# Patient Record
Sex: Male | Born: 1935
Health system: Southern US, Community
[De-identification: ages and names within clinical notes are randomized; demographics above are authoritative.]

## PROBLEM LIST (undated history)

## (undated) DIAGNOSIS — M5030 Other cervical disc degeneration, unspecified cervical region: Secondary | ICD-10-CM

## (undated) DIAGNOSIS — L409 Psoriasis, unspecified: Secondary | ICD-10-CM

## (undated) DIAGNOSIS — I2510 Atherosclerotic heart disease of native coronary artery without angina pectoris: Secondary | ICD-10-CM

## (undated) DIAGNOSIS — K5792 Diverticulitis of intestine, part unspecified, without perforation or abscess without bleeding: Secondary | ICD-10-CM

## (undated) DIAGNOSIS — I10 Essential (primary) hypertension: Secondary | ICD-10-CM

## (undated) DIAGNOSIS — M1990 Unspecified osteoarthritis, unspecified site: Secondary | ICD-10-CM

## (undated) DIAGNOSIS — E785 Hyperlipidemia, unspecified: Secondary | ICD-10-CM

## (undated) DIAGNOSIS — Z8719 Personal history of other diseases of the digestive system: Secondary | ICD-10-CM

## (undated) DIAGNOSIS — N400 Enlarged prostate without lower urinary tract symptoms: Secondary | ICD-10-CM

## (undated) DIAGNOSIS — I5189 Other ill-defined heart diseases: Secondary | ICD-10-CM

## (undated) DIAGNOSIS — K469 Unspecified abdominal hernia without obstruction or gangrene: Secondary | ICD-10-CM

## (undated) DIAGNOSIS — I639 Cerebral infarction, unspecified: Secondary | ICD-10-CM

## (undated) DIAGNOSIS — D649 Anemia, unspecified: Secondary | ICD-10-CM

## (undated) HISTORY — DX: Psoriasis, unspecified: L40.9

## (undated) HISTORY — DX: Other ill-defined heart diseases: I51.89

## (undated) HISTORY — DX: Atherosclerotic heart disease of native coronary artery without angina pectoris: I25.10

## (undated) HISTORY — DX: Hyperlipidemia, unspecified: E78.5

## (undated) HISTORY — DX: Cerebral infarction, unspecified: I63.9

## (undated) HISTORY — DX: Essential (primary) hypertension: I10

## (undated) HISTORY — DX: Benign prostatic hyperplasia without lower urinary tract symptoms: N40.0

## (undated) HISTORY — PX: OTHER SURGICAL HISTORY: SHX169

## (undated) HISTORY — DX: Unspecified osteoarthritis, unspecified site: M19.90

## (undated) HISTORY — DX: Anemia, unspecified: D64.9

## (undated) HISTORY — DX: Personal history of other diseases of the digestive system: Z87.19

## (undated) HISTORY — DX: Unspecified abdominal hernia without obstruction or gangrene: K46.9

## (undated) HISTORY — DX: Diverticulitis of intestine, part unspecified, without perforation or abscess without bleeding: K57.92

## (undated) HISTORY — DX: Other cervical disc degeneration, unspecified cervical region: M50.30

---
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
## 1997-09-07 ENCOUNTER — Observation Stay (HOSPITAL_COMMUNITY): Admission: RE | Admit: 1997-09-07 | Discharge: 1997-09-08 | Payer: Self-pay | Admitting: Orthopedic Surgery

## 1997-09-15 ENCOUNTER — Other Ambulatory Visit: Admission: RE | Admit: 1997-09-15 | Discharge: 1997-09-15 | Payer: Self-pay | Admitting: Family Medicine

## 1997-10-04 ENCOUNTER — Other Ambulatory Visit: Admission: RE | Admit: 1997-10-04 | Discharge: 1997-10-04 | Payer: Self-pay | Admitting: Family Medicine

## 1997-10-09 ENCOUNTER — Encounter: Admission: RE | Admit: 1997-10-09 | Discharge: 1998-01-07 | Payer: Self-pay | Admitting: Orthopedic Surgery

## 1997-11-01 ENCOUNTER — Other Ambulatory Visit: Admission: RE | Admit: 1997-11-01 | Discharge: 1997-11-01 | Payer: Self-pay | Admitting: Family Medicine

## 1998-03-08 ENCOUNTER — Ambulatory Visit (HOSPITAL_COMMUNITY): Admission: RE | Admit: 1998-03-08 | Discharge: 1998-03-08 | Payer: Self-pay | Admitting: Family Medicine

## 1998-03-08 ENCOUNTER — Encounter: Payer: Self-pay | Admitting: Family Medicine

## 1999-02-10 ENCOUNTER — Emergency Department (HOSPITAL_COMMUNITY): Admission: EM | Admit: 1999-02-10 | Discharge: 1999-02-10 | Payer: Self-pay | Admitting: *Deleted

## 2001-04-07 HISTORY — PX: OTHER SURGICAL HISTORY: SHX169

## 2001-06-07 ENCOUNTER — Encounter: Payer: Self-pay | Admitting: *Deleted

## 2001-06-07 ENCOUNTER — Ambulatory Visit (HOSPITAL_COMMUNITY): Admission: RE | Admit: 2001-06-07 | Discharge: 2001-06-07 | Payer: Self-pay | Admitting: *Deleted

## 2001-06-21 ENCOUNTER — Encounter: Payer: Self-pay | Admitting: *Deleted

## 2001-06-23 ENCOUNTER — Ambulatory Visit (HOSPITAL_COMMUNITY): Admission: RE | Admit: 2001-06-23 | Discharge: 2001-06-23 | Payer: Self-pay | Admitting: *Deleted

## 2001-06-23 ENCOUNTER — Encounter (INDEPENDENT_AMBULATORY_CARE_PROVIDER_SITE_OTHER): Payer: Self-pay | Admitting: Specialist

## 2001-08-29 ENCOUNTER — Emergency Department (HOSPITAL_COMMUNITY): Admission: EM | Admit: 2001-08-29 | Discharge: 2001-08-29 | Payer: Self-pay | Admitting: Emergency Medicine

## 2001-08-29 ENCOUNTER — Encounter: Payer: Self-pay | Admitting: Emergency Medicine

## 2001-09-06 ENCOUNTER — Emergency Department (HOSPITAL_COMMUNITY): Admission: EM | Admit: 2001-09-06 | Discharge: 2001-09-06 | Payer: Self-pay | Admitting: *Deleted

## 2001-09-23 ENCOUNTER — Encounter (HOSPITAL_BASED_OUTPATIENT_CLINIC_OR_DEPARTMENT_OTHER): Admission: RE | Admit: 2001-09-23 | Discharge: 2001-12-22 | Payer: Self-pay | Admitting: Internal Medicine

## 2001-12-27 ENCOUNTER — Encounter (HOSPITAL_BASED_OUTPATIENT_CLINIC_OR_DEPARTMENT_OTHER): Admission: RE | Admit: 2001-12-27 | Discharge: 2001-12-31 | Payer: Self-pay | Admitting: Internal Medicine

## 2002-09-20 ENCOUNTER — Encounter: Admission: RE | Admit: 2002-09-20 | Discharge: 2002-09-20 | Payer: Self-pay | Admitting: Family Medicine

## 2002-09-20 ENCOUNTER — Encounter: Payer: Self-pay | Admitting: Family Medicine

## 2002-11-17 ENCOUNTER — Ambulatory Visit (HOSPITAL_COMMUNITY): Admission: RE | Admit: 2002-11-17 | Discharge: 2002-11-17 | Payer: Self-pay | Admitting: Gastroenterology

## 2005-10-18 ENCOUNTER — Emergency Department (HOSPITAL_COMMUNITY): Admission: EM | Admit: 2005-10-18 | Discharge: 2005-10-18 | Payer: Self-pay | Admitting: Emergency Medicine

## 2006-04-16 ENCOUNTER — Encounter: Admission: RE | Admit: 2006-04-16 | Discharge: 2006-05-18 | Payer: Self-pay | Admitting: Family Medicine

## 2008-04-11 ENCOUNTER — Encounter: Admission: RE | Admit: 2008-04-11 | Discharge: 2008-05-24 | Payer: Self-pay | Admitting: Family Medicine

## 2008-08-30 ENCOUNTER — Emergency Department (HOSPITAL_COMMUNITY): Admission: EM | Admit: 2008-08-30 | Discharge: 2008-08-30 | Payer: Self-pay | Admitting: Emergency Medicine

## 2009-08-01 ENCOUNTER — Inpatient Hospital Stay (HOSPITAL_COMMUNITY): Admission: EM | Admit: 2009-08-01 | Discharge: 2009-08-04 | Payer: Self-pay | Admitting: Emergency Medicine

## 2009-08-02 ENCOUNTER — Encounter (INDEPENDENT_AMBULATORY_CARE_PROVIDER_SITE_OTHER): Payer: Self-pay | Admitting: Internal Medicine

## 2009-08-05 LAB — HM DIABETES FOOT EXAM

## 2010-03-08 ENCOUNTER — Emergency Department (HOSPITAL_COMMUNITY)
Admission: EM | Admit: 2010-03-08 | Discharge: 2010-03-08 | Payer: Self-pay | Source: Home / Self Care | Admitting: Emergency Medicine

## 2010-06-25 LAB — DIFFERENTIAL
Basophils Absolute: 0 10*3/uL (ref 0.0–0.1)
Lymphocytes Relative: 15 % (ref 12–46)
Monocytes Absolute: 0.4 10*3/uL (ref 0.1–1.0)
Monocytes Relative: 4 % (ref 3–12)
Neutro Abs: 6.5 10*3/uL (ref 1.7–7.7)

## 2010-06-25 LAB — GLUCOSE, CAPILLARY
Glucose-Capillary: 135 mg/dL — ABNORMAL HIGH (ref 70–99)
Glucose-Capillary: 148 mg/dL — ABNORMAL HIGH (ref 70–99)
Glucose-Capillary: 164 mg/dL — ABNORMAL HIGH (ref 70–99)
Glucose-Capillary: 177 mg/dL — ABNORMAL HIGH (ref 70–99)
Glucose-Capillary: 187 mg/dL — ABNORMAL HIGH (ref 70–99)
Glucose-Capillary: 197 mg/dL — ABNORMAL HIGH (ref 70–99)
Glucose-Capillary: 202 mg/dL — ABNORMAL HIGH (ref 70–99)
Glucose-Capillary: 350 mg/dL — ABNORMAL HIGH (ref 70–99)

## 2010-06-25 LAB — CBC
HCT: 34.8 % — ABNORMAL LOW (ref 39.0–52.0)
HCT: 35.3 % — ABNORMAL LOW (ref 39.0–52.0)
Hemoglobin: 10.8 g/dL — ABNORMAL LOW (ref 13.0–17.0)
Hemoglobin: 11.3 g/dL — ABNORMAL LOW (ref 13.0–17.0)
Hemoglobin: 11.6 g/dL — ABNORMAL LOW (ref 13.0–17.0)
MCHC: 32.7 g/dL (ref 30.0–36.0)
MCHC: 32.8 g/dL (ref 30.0–36.0)
Platelets: 125 10*3/uL — ABNORMAL LOW (ref 150–400)
Platelets: 134 10*3/uL — ABNORMAL LOW (ref 150–400)
RBC: 3.7 MIL/uL — ABNORMAL LOW (ref 4.22–5.81)
RBC: 3.87 MIL/uL — ABNORMAL LOW (ref 4.22–5.81)
RBC: 4.48 MIL/uL (ref 4.22–5.81)
RDW: 14.2 % (ref 11.5–15.5)
RDW: 14.3 % (ref 11.5–15.5)
RDW: 14.4 % (ref 11.5–15.5)
RDW: 14.6 % (ref 11.5–15.5)
WBC: 7.2 10*3/uL (ref 4.0–10.5)

## 2010-06-25 LAB — CLOSTRIDIUM DIFFICILE EIA

## 2010-06-25 LAB — COMPREHENSIVE METABOLIC PANEL
ALT: 13 U/L (ref 0–53)
Albumin: 3 g/dL — ABNORMAL LOW (ref 3.5–5.2)
Albumin: 4 g/dL (ref 3.5–5.2)
Alkaline Phosphatase: 35 U/L — ABNORMAL LOW (ref 39–117)
Alkaline Phosphatase: 56 U/L (ref 39–117)
BUN: 12 mg/dL (ref 6–23)
BUN: 9 mg/dL (ref 6–23)
Chloride: 102 mEq/L (ref 96–112)
Chloride: 109 mEq/L (ref 96–112)
Chloride: 112 mEq/L (ref 96–112)
Creatinine, Ser: 0.85 mg/dL (ref 0.4–1.5)
GFR calc non Af Amer: >60 mL/min (ref >60)
Potassium: 3.9 mEq/L (ref 3.5–5.1)
Potassium: 4 mEq/L (ref 3.5–5.1)
Total Bilirubin: 0.4 mg/dL (ref 0.3–1.2)
Total Bilirubin: 0.7 mg/dL (ref 0.3–1.2)
Total Protein: 5.9 g/dL — ABNORMAL LOW (ref 6.0–8.3)
Total Protein: 7.3 g/dL (ref 6.0–8.3)

## 2010-06-25 LAB — URINE MICROSCOPIC-ADD ON

## 2010-06-25 LAB — CARDIAC PANEL(CRET KIN+CKTOT+MB+TROPI)
CK, MB: 4.6 ng/mL — ABNORMAL HIGH (ref 0.3–4.0)
CK, MB: 8 ng/mL (ref 0.3–4.0)
Relative Index: 4.4 — ABNORMAL HIGH (ref 0.0–2.5)
Total CK: 105 U/L (ref 7–232)
Total CK: 107 U/L (ref 7–232)
Total CK: 183 U/L (ref 7–232)
Troponin I: 0.05 ng/mL (ref 0.00–0.06)
Troponin I: 0.07 ng/mL — ABNORMAL HIGH (ref 0.00–0.06)
Troponin I: 0.09 ng/mL — ABNORMAL HIGH (ref 0.00–0.06)

## 2010-06-25 LAB — PHOSPHORUS: Phosphorus: 2.8 mg/dL (ref 2.3–4.6)

## 2010-06-25 LAB — HEMOCCULT GUIAC POC 1CARD (OFFICE)
Fecal Occult Bld: NEGATIVE
Fecal Occult Bld: NEGATIVE

## 2010-06-25 LAB — HEPARIN LEVEL (UNFRACTIONATED)
Heparin Unfractionated: 0.51 IU/mL (ref 0.30–0.70)
Heparin Unfractionated: <0.1 IU/mL — ABNORMAL LOW (ref 0.30–0.70)

## 2010-06-25 LAB — TSH: TSH: 3.188 u[IU]/mL (ref 0.350–4.500)

## 2010-06-25 LAB — CULTURE, BLOOD (ROUTINE X 2): Culture: NO GROWTH

## 2010-06-25 LAB — URINALYSIS, ROUTINE W REFLEX MICROSCOPIC
Specific Gravity, Urine: 1.033 — ABNORMAL HIGH (ref 1.005–1.030)
pH: 5.5 (ref 5.0–8.0)

## 2010-06-25 LAB — POCT CARDIAC MARKERS: CKMB, poc: 1.4 ng/mL (ref 1.0–8.0)

## 2010-06-25 LAB — BRAIN NATRIURETIC PEPTIDE: Pro B Natriuretic peptide (BNP): 286 pg/mL — ABNORMAL HIGH (ref 0.0–100.0)

## 2010-06-25 LAB — LIPASE, BLOOD: Lipase: 39 U/L (ref 11–59)

## 2010-08-14 ENCOUNTER — Emergency Department (HOSPITAL_COMMUNITY)
Admission: EM | Admit: 2010-08-14 | Discharge: 2010-08-14 | Disposition: A | Discharge status: Home / Self Care | Payer: Medicare Other | Attending: Emergency Medicine | Admitting: Emergency Medicine

## 2010-08-14 ENCOUNTER — Emergency Department (HOSPITAL_COMMUNITY): Payer: Medicare Other

## 2010-08-14 DIAGNOSIS — J40 Bronchitis, not specified as acute or chronic: Secondary | ICD-10-CM | POA: Insufficient documentation

## 2010-08-14 DIAGNOSIS — R05 Cough: Secondary | ICD-10-CM | POA: Insufficient documentation

## 2010-08-14 DIAGNOSIS — E119 Type 2 diabetes mellitus without complications: Secondary | ICD-10-CM | POA: Insufficient documentation

## 2010-08-14 DIAGNOSIS — R059 Cough, unspecified: Secondary | ICD-10-CM | POA: Insufficient documentation

## 2010-08-14 DIAGNOSIS — I10 Essential (primary) hypertension: Secondary | ICD-10-CM | POA: Insufficient documentation

## 2010-08-23 NOTE — Op Note (Signed)
   NAME:  Jeffrey Kaiser, Jeffrey Kaiser                          ACCOUNT NO.:  [id number]   MEDICAL RECORD NO.:  [id number]                   PATIENT TYPE:  AMB   LOCATION:  ENDO                                 FACILITY:  [facility]   PHYSICIAN:  Graylin Shiver, M.D.                DATE OF BIRTH:  1925-08-27   DATE OF PROCEDURE:  11/17/2002  DATE OF DISCHARGE:                                 OPERATIVE REPORT   PROCEDURE:  Colonoscopy.   INDICATION:  Screening.   Informed consent was obtained after explanation of the risks of bleeding,  infection, and perforation.   PREMEDICATIONS:  1. Fentanyl 62.5 mcg IV.  2. Versed 5 mg IV.   DESCRIPTION OF PROCEDURE:  With the patient in the left lateral decubitus  position, a rectal exam was performed, and no masses were felt.  The Olympus  colonoscope was inserted into the rectum and advanced around the colon to  the cecum.  Cecal landmarks were identified.  The cecum and ascending colon  looked normal.  The transverse colon looked normal.  The descending colon  and sigmoid revealed diverticulosis which were most extensive in the region  of the sigmoid.  The rectum looked normal.  He tolerated the procedure well  without complications.   IMPRESSION:  Diverticulosis of the left colon.                                               Graylin Shiver, M.D.    SFG/MEDQ  D:  11/17/2002  T:  11/17/2002  Job:  [id number]   cc:   Chales Salmon. Abigail Miyamoto, M.D.  [address]  [place]  [place] [address]  Fax: [phone]

## 2010-08-23 NOTE — Op Note (Signed)
[facility]  Patient:    Jeffrey Kaiser, Jeffrey Kaiser Visit Number: [id number] MRN: [id number]          Service Type: DSU Location: RCRM 2550 08 Attending Physician:  Kandis Mannan Dictated by:   Donnie Coffin Samuella Cota, M.D. Proc. Date: 06/23/01 Admit Date:  06/23/2001 Discharge Date: 06/23/2001   CC:         Molly Maduro K. Abigail Miyamoto, M.D.   Operative Report  CCS# [id number]  PREOPERATIVE DIAGNOSIS:  Large lipoma, right anterior lower chest and upper abdomen.  POSTOPERATIVE DIAGNOSIS:  Large lipoma, right anterior lower chest and upper abdomen.  OPERATION PERFORMED:  Excision of lipoma, right anterior lower chest and upper abdomen.  SURGEON:  Maisie Fus B. Samuella Cota, M.D.  ANESTHESIA:  General.  ANESTHESIOLOGIST:  Dr. Krista Blue and CRNA.  DESCRIPTION OF PROCEDURE:  The patient was taken to the operating room and placed on the table in supine position.  After satisfactory general anesthetic with LMA intubation, the right lower chest and upper abdomen was prepped and draped as a sterile field.  The marking pen was used to outline a subcutaneous mass which was 12 cm by 12 cm.  A vertical incision was then made over the mass and the mass was only a fatty tissue but it did not shell out like a routine lipoma.  It was necessary to develop skin flaps to the periphery of the mass.  The tissue was then taken off of the anterior rectus sheath.  This appeared only to be fatty tissue.  The skin flaps were purposely kept fairly thick.  The skin flaps were further debrided to try to smooth out the wound. A #10 Jackson-Pratt drain was placed through a stab wound inferior and lateral to the incision and sutured to the skin using 3-0 nylon.  The wound was copiously irrigated.  Subcutaneous tissue closed with 3-0 Vicryl.  The skin was closed with interrupted sutures of 3-0 nylon as vertical mattress sutures. The Jackson-Pratt drain was connected to bulb suction.  Vaseline gauze was placed  over the suture line and around the drain site.  Dry sterile dressing using 4 x 4s, ABD, and 4 inch HypaFix was applied.  The patient seemed to tolerate the procedure well.  The incision was 9 cm and the tissue removed was 12 cm by 12 cm. Dictated by:   Donnie Coffin Samuella Cota, M.D. Attending Physician:  Kandis Mannan DD:  06/23/01 TD:  06/24/01 Job: [id number]

## 2010-09-04 ENCOUNTER — Ambulatory Visit (INDEPENDENT_AMBULATORY_CARE_PROVIDER_SITE_OTHER): Payer: Medicare Other | Admitting: Internal Medicine

## 2010-09-04 ENCOUNTER — Telehealth: Payer: Self-pay | Admitting: Internal Medicine

## 2010-09-04 ENCOUNTER — Encounter: Payer: Self-pay | Admitting: Internal Medicine

## 2010-09-04 ENCOUNTER — Ambulatory Visit: Payer: Self-pay | Admitting: Internal Medicine

## 2010-09-04 ENCOUNTER — Other Ambulatory Visit: Payer: Self-pay | Admitting: Internal Medicine

## 2010-09-04 ENCOUNTER — Other Ambulatory Visit (INDEPENDENT_AMBULATORY_CARE_PROVIDER_SITE_OTHER): Payer: Medicare Other

## 2010-09-04 VITALS — BP 140/62 | HR 80 | Temp 98.5°F | Ht 64.0 in | Wt 157.2 lb

## 2010-09-04 DIAGNOSIS — R5381 Other malaise: Secondary | ICD-10-CM

## 2010-09-04 DIAGNOSIS — L409 Psoriasis, unspecified: Secondary | ICD-10-CM

## 2010-09-04 DIAGNOSIS — D649 Anemia, unspecified: Secondary | ICD-10-CM

## 2010-09-04 DIAGNOSIS — R5383 Other fatigue: Secondary | ICD-10-CM

## 2010-09-04 DIAGNOSIS — M1990 Unspecified osteoarthritis, unspecified site: Secondary | ICD-10-CM | POA: Insufficient documentation

## 2010-09-04 DIAGNOSIS — IMO0001 Reserved for inherently not codable concepts without codable children: Secondary | ICD-10-CM

## 2010-09-04 DIAGNOSIS — Z0000 Encounter for general adult medical examination without abnormal findings: Secondary | ICD-10-CM | POA: Insufficient documentation

## 2010-09-04 DIAGNOSIS — Z8719 Personal history of other diseases of the digestive system: Secondary | ICD-10-CM | POA: Insufficient documentation

## 2010-09-04 DIAGNOSIS — I10 Essential (primary) hypertension: Secondary | ICD-10-CM | POA: Insufficient documentation

## 2010-09-04 DIAGNOSIS — M5030 Other cervical disc degeneration, unspecified cervical region: Secondary | ICD-10-CM

## 2010-09-04 DIAGNOSIS — E785 Hyperlipidemia, unspecified: Secondary | ICD-10-CM

## 2010-09-04 DIAGNOSIS — I5189 Other ill-defined heart diseases: Secondary | ICD-10-CM

## 2010-09-04 HISTORY — DX: Psoriasis, unspecified: L40.9

## 2010-09-04 HISTORY — DX: Other ill-defined heart diseases: I51.89

## 2010-09-04 HISTORY — DX: Anemia, unspecified: D64.9

## 2010-09-04 HISTORY — DX: Personal history of other diseases of the digestive system: Z87.19

## 2010-09-04 HISTORY — DX: Other cervical disc degeneration, unspecified cervical region: M50.30

## 2010-09-04 LAB — CBC WITH DIFFERENTIAL/PLATELET
Basophils Relative: 0.4 % (ref 0.0–3.0)
Eosinophils Relative: 2.8 % (ref 0.0–5.0)
Lymphocytes Relative: 26 % (ref 12.0–46.0)
MCV: 86.3 fl (ref 78.0–100.0)
Monocytes Relative: 7.9 % (ref 3.0–12.0)
Neutrophils Relative %: 62.9 % (ref 43.0–77.0)
RBC: 4.5 Mil/uL (ref 4.22–5.81)
WBC: 6.3 10*3/uL (ref 4.5–10.5)

## 2010-09-04 LAB — BASIC METABOLIC PANEL
BUN: 18 mg/dL (ref 6–23)
Chloride: 101 mEq/L (ref 96–112)
Creatinine, Ser: 0.8 mg/dL (ref 0.4–1.5)
Glucose, Bld: 136 mg/dL — ABNORMAL HIGH (ref 70–99)

## 2010-09-04 LAB — MICROALBUMIN / CREATININE URINE RATIO
Creatinine,U: 143 mg/dL
Microalb Creat Ratio: 0.8 mg/g (ref 0.0–30.0)
Microalb, Ur: 1.2 mg/dL (ref 0.0–1.9)

## 2010-09-04 LAB — HEPATIC FUNCTION PANEL
ALT: 17 U/L (ref 0–53)
Albumin: 3.9 g/dL (ref 3.5–5.2)
Total Bilirubin: 0.8 mg/dL (ref 0.3–1.2)

## 2010-09-04 LAB — HEMOGLOBIN A1C: Hgb A1c MFr Bld: 8.8 % — ABNORMAL HIGH (ref 4.6–6.5)

## 2010-09-04 LAB — TSH: TSH: 2.38 u[IU]/mL (ref 0.35–5.50)

## 2010-09-04 LAB — LIPID PANEL: Cholesterol: 186 mg/dL (ref 0–200)

## 2010-09-04 MED ORDER — CYCLOBENZAPRINE HCL 5 MG PO TABS: 5.0000 mg | ORAL_TABLET | Freq: Three times a day (TID) | ORAL | Status: DC | PRN

## 2010-09-04 MED ORDER — GLIPIZIDE ER 10 MG PO TB24: 10.0000 mg | ORAL_TABLET | Freq: Every day | ORAL | Status: DC

## 2010-09-04 NOTE — Patient Instructions (Signed)
Continue all other medications as before Please go to LAB in the Basement for the blood and/or urine tests to be done today Please call the phone number [phone] (the PhoneTree System) for results of testing in 2-3 days;  When calling, simply dial the number, and when prompted enter the MRN number above (the Medical Record Number) and the # key, then the message should start. Please return in 6 months

## 2010-09-04 NOTE — Telephone Encounter (Signed)
Forwarded to Dr. John for review °

## 2010-09-04 NOTE — Progress Notes (Signed)
  Subjective:    Patient ID: Jeffrey Kaiser, male    DOB: Feb 27, 1926, 77 y.o.   MRN: [id number]  HPI  Here for wellness and f/u;  Overall doing ok;  Pt denies CP, worsening SOB, DOE, wheezing, orthopnea, PND, worsening LE edema, palpitations, dizziness or syncope.  Pt denies neurological change such as new Headache, facial or extremity weakness.  Pt denies polydipsia, polyuria, or low sugar symptoms. Pt states overall good compliance with treatment and medications, good tolerability, and trying to follow lower cholesterol diet.  Pt denies worsening depressive symptoms, suicidal ideation or panic. No fever, wt loss, night sweats, loss of appetite, or other constitutional symptoms.  Pt states good ability with ADL's, low fall risk, home safety reviewed and adequate, no significant changes in hearing or vision, and occasionally active with exercise.  Overall doing ok, except for the recurring neck pain  without change in severity, bowel or bladder change, fever, wt loss,  worsening LE pain/numbness/weakness, gait change or falls. Does have sense of ongoing fatigue, but denies signficant hypersomnolence.  Past Medical History  Diagnosis Date  . Arthritis   . Diabetes mellitus   . Diverticulitis   . Glaucoma   . Hypertension   . Hyperlipidemia   . Hernia   . History of colonic diverticulitis 09/04/2010  . Anemia 09/04/2010  . DDD (degenerative disc disease), cervical 09/04/2010  . Diastolic dysfunction 09/04/2010   Past Surgical History  Procedure Date  . Tonsillectomy 1952  . Broken legs 1981 and 1995    reports that he has never smoked. He does not have any smokeless tobacco history on file. He reports that he does not drink alcohol or use illicit drugs. family history includes Arthritis in his father and mother; Cancer in his other; Heart disease in his father and mother; and Hypertension in his father and mother. No Known Allergies No current outpatient prescriptions on file prior to visit.    Review of Systems All otherwise neg per pt except for a 'cyst" to the right shoulder he is concerned about    Objective:   Physical Exam BP 140/62  Pulse 80  Temp(Src) 98.5 F (36.9 C) (Oral)  Ht 5\' 4"  (1.626 m)  Wt 157 lb 4 oz (71.328 kg)  BMI 26.99 kg/m2  SpO2 95% Physical Exam  VS noted Constitutional: Pt appears well-developed and well-nourished.  HENT: Head: Normocephalic.  Right Ear: External ear normal.  Left Ear: External ear normal.  Eyes: Conjunctivae and EOM are normal. Pupils are equal, round, and reactive to light.  Neck: Normal range of motion. Neck supple.  Cardiovascular: Normal rate and regular rhythm.   Pulmonary/Chest: Effort normal and breath sounds normal.  Abd:  Soft, NT, non-distended, + BS Neurological: Pt is alert. No cranial nerve deficit.  Skin: Skin is warm. No erythema.  Psychiatric: Pt behavior is normal. Thought content normal.         Assessment & Plan:

## 2010-09-04 NOTE — Assessment & Plan Note (Signed)
With some stiffness bilat paracervical - ok to try trial flexeril prn

## 2010-09-04 NOTE — Assessment & Plan Note (Signed)
stable overall by hx and exam, most recent data reviewed with pt, and pt to continue medical treatment as before 

## 2010-09-04 NOTE — Assessment & Plan Note (Signed)
Etiology unclear, Exam otherwise benign, to check labs as documented, follow with expectant management  

## 2010-09-09 ENCOUNTER — Encounter: Payer: Self-pay | Admitting: Internal Medicine

## 2010-09-09 DIAGNOSIS — N400 Enlarged prostate without lower urinary tract symptoms: Secondary | ICD-10-CM

## 2010-09-09 DIAGNOSIS — I2510 Atherosclerotic heart disease of native coronary artery without angina pectoris: Secondary | ICD-10-CM | POA: Insufficient documentation

## 2010-09-09 HISTORY — DX: Benign prostatic hyperplasia without lower urinary tract symptoms: N40.0

## 2010-10-21 ENCOUNTER — Other Ambulatory Visit: Payer: Self-pay

## 2010-10-21 ENCOUNTER — Telehealth: Payer: Self-pay

## 2010-10-21 MED ORDER — ISOSORBIDE MONONITRATE 60 MG PO TB24: 60.0000 mg | ORAL_TABLET | Freq: Every day | ORAL | Status: DC

## 2010-10-21 NOTE — Telephone Encounter (Signed)
Please clarify with pt, but I believe it is 1/2 per day, and the rx sent then would be in error  If pt confirms the 1/2 per day, please inform pharmacy

## 2010-10-21 NOTE — Telephone Encounter (Signed)
Pharmacy received refill on Isosorbide mononitrate 60 mg 24 hr. Please clarify 1 every day or 1/2 every day.

## 2010-10-22 NOTE — Telephone Encounter (Signed)
Called patient; left message to call back.

## 2010-10-22 NOTE — Telephone Encounter (Signed)
Called patient and he confirmed he is taking 1/2 per day, called [facility] [address] informed of information.

## 2010-11-20 ENCOUNTER — Ambulatory Visit (INDEPENDENT_AMBULATORY_CARE_PROVIDER_SITE_OTHER): Payer: Medicare Other | Admitting: Internal Medicine

## 2010-11-20 ENCOUNTER — Encounter: Payer: Self-pay | Admitting: Internal Medicine

## 2010-11-20 VITALS — BP 152/82 | HR 83 | Temp 97.0°F | Ht 65.0 in | Wt 157.5 lb

## 2010-11-20 DIAGNOSIS — E785 Hyperlipidemia, unspecified: Secondary | ICD-10-CM

## 2010-11-20 DIAGNOSIS — M25519 Pain in unspecified shoulder: Secondary | ICD-10-CM

## 2010-11-20 DIAGNOSIS — I10 Essential (primary) hypertension: Secondary | ICD-10-CM

## 2010-11-20 DIAGNOSIS — M25511 Pain in right shoulder: Secondary | ICD-10-CM | POA: Insufficient documentation

## 2010-11-20 MED ORDER — TRAMADOL HCL 50 MG PO TABS: 50.0000 mg | ORAL_TABLET | Freq: Four times a day (QID) | ORAL | Status: AC | PRN

## 2010-11-20 NOTE — Assessment & Plan Note (Addendum)
Improved overall with recent med change may 2012;  Continue all other medications as before, for labs next visit, call for cbg > 200 \ Lab Results  Component Value Date   HGBA1C 8.8* 09/04/2010

## 2010-11-20 NOTE — Progress Notes (Signed)
Subjective:    Patient ID: Jeffrey Kaiser, male    DOB: 18-Feb-1926, 77 y.o.   MRN: [id number]  HPI   Here with 3 mo right upper back/shoulder pain with radaiation to the hand, pitched baseball for 25 yrs  (no prior surgury) and has known c-spine DDD per pt per films per ortho at [facility] ; also hx of compound fx RUE remotely as well per Dr Su Hilt (retired otho - [place]). Hurst more to raise the arm .overall no neck pain per se, except midline tender in the back lower c-spine which is chronic to him and no change.   Here to f/u; overall doing ok,  Pt denies chest pain, increased sob or doe, wheezing, orthopnea, PND, increased LE swelling, palpitations, dizziness or syncope.  Pt denies new neurological symptoms such as new headache, or facial or extremity weakness or numbness   Pt denies polydipsia, polyuria, or low sugar symptoms such as weakness or confusion improved with po intake.  Pt states overall good compliance with meds, trying to follow lower cholesterol, diabetic diet, wt overall stable but little exercise however. Past Medical History  Diagnosis Date  . Arthritis   . Diabetes mellitus   . Diverticulitis   . Glaucoma   . Hypertension   . Hyperlipidemia   . Hernia   . History of colonic diverticulitis 09/04/2010  . Anemia 09/04/2010  . DDD (degenerative disc disease), cervical 09/04/2010  . Diastolic dysfunction 09/04/2010  . Psoriasis 09/04/2010  . BPH (benign prostatic hyperplasia) 09/09/2010  . CAD (coronary artery disease)    Past Surgical History  Procedure Date  . Tonsillectomy 1952  . Broken legs 1981 and 1995  . S/p lipoma 2003    reports that he has never smoked. He does not have any smokeless tobacco history on file. He reports that he does not drink alcohol or use illicit drugs. family history includes Arthritis in his father and mother; Cancer in his other; Heart disease in his father and mother; and Hypertension in his father and mother. No Known Allergies Current  Outpatient Prescriptions on File Prior to Visit  Medication Sig Dispense Refill  . clobetasol (TEMOVATE) 0.05 % external solution as directed.        . cyclobenzaprine (FLEXERIL) 5 MG tablet Take 1 tablet (5 mg total) by mouth 3 (three) times daily as needed for muscle spasms.  60 tablet  1  . fosinopril (MONOPRIL) 20 MG tablet Take 20 mg by mouth daily.        Marland Kitchen glipiZIDE (GLIPIZIDE XL) 10 MG 24 hr tablet Take 1 tablet (10 mg total) by mouth daily.  90 tablet  3  . isosorbide mononitrate (IMDUR) 60 MG 24 hr tablet Take 1 tablet (60 mg total) by mouth daily. 1/2 tablet daily  30 tablet  11  . metFORMIN (GLUCOPHAGE) 500 MG tablet 2 by mouth in the morning, 1 by mouth at noon, and 2 by mouth every evening.       . metoprolol (LOPRESSOR) 50 MG tablet Take 50 mg by mouth daily.        . pravastatin (PRAVACHOL) 80 MG tablet Take 80 mg by mouth at bedtime.         Review of Systems Review of Systems  Constitutional: Negative for diaphoresis and unexpected weight change.  HENT: Negative for drooling and tinnitus.   Eyes: Negative for photophobia and visual disturbance.  Respiratory: Negative for choking and stridor.   Gastrointestinal: Negative for vomiting and blood in stool.  Objective:   Physical Exam BP 152/82  Pulse 83  Temp(Src) 97 F (36.1 C) (Oral)  Ht 5\' 5"  (1.651 m)  Wt 157 lb 8 oz (71.442 kg)  BMI 26.21 kg/m2  SpO2 96% Physical Exam  VS noted Constitutional: Pt appears well-developed and well-nourished.  HENT: Head: Normocephalic.  Right Ear: External ear normal.  Left Ear: External ear normal.  Eyes: Conjunctivae and EOM are normal. Pupils are equal, round, and reactive to light.  Neck: Normal range of motion. Neck supple.  Cardiovascular: Normal rate and regular rhythm.   Pulmonary/Chest: Effort normal and breath sounds normal.  Abd:  Soft, NT, non-distended, + BS Neurological: Pt is alert. No cranial nerve deficit.  Skin: Skin is warm. No erythema.  Psychiatric: Pt  behavior is normal. Thought content normal.  Right shoudler NT, has incidental seb cyst posteriorly noninflamed; has marked AC joint djd, and decreaesed ROM by 20 degrees and pain elicited on forward elevation\  O/w neurovasc intact       Assessment & Plan:

## 2010-11-20 NOTE — Patient Instructions (Addendum)
Take all new medications as prescribed Continue all other medications as before You will be contacted regarding the referral for: orthopedic

## 2010-11-20 NOTE — Assessment & Plan Note (Addendum)
stable overall by hx and exam, most recent data reviewed with pt, and pt to continue medical treatment as before, mild increased today prob situational   BP Readings from Last 3 Encounters:  11/20/10 152/82  09/04/10 140/62

## 2010-11-20 NOTE — Assessment & Plan Note (Signed)
stable overall by hx and exam, most recent data reviewed with pt, and pt to continue medical treatment as before  Lab Results  Component Value Date   LDLCALC 112* 09/04/2010    Declines med change today

## 2010-11-20 NOTE — Assessment & Plan Note (Signed)
I suspect right rotater cuff dz, or shoulder DJD  For now for tx with ultram prn, and refer ortho for further eval, likely could use cortisone to shoudler

## 2010-12-13 ENCOUNTER — Other Ambulatory Visit: Payer: Self-pay

## 2010-12-13 MED ORDER — METOPROLOL TARTRATE 50 MG PO TABS: 50.0000 mg | ORAL_TABLET | Freq: Every day | ORAL | Status: DC

## 2011-03-07 ENCOUNTER — Ambulatory Visit (INDEPENDENT_AMBULATORY_CARE_PROVIDER_SITE_OTHER): Payer: Medicare Other | Admitting: Internal Medicine

## 2011-03-07 ENCOUNTER — Encounter: Payer: Self-pay | Admitting: Internal Medicine

## 2011-03-07 VITALS — BP 140/78 | HR 72 | Temp 97.6°F | Ht 65.0 in | Wt 157.4 lb

## 2011-03-07 DIAGNOSIS — E785 Hyperlipidemia, unspecified: Secondary | ICD-10-CM

## 2011-03-07 DIAGNOSIS — I10 Essential (primary) hypertension: Secondary | ICD-10-CM

## 2011-03-07 DIAGNOSIS — Z23 Encounter for immunization: Secondary | ICD-10-CM

## 2011-03-07 NOTE — Assessment & Plan Note (Signed)
stable overall by hx and exam, most recent data reviewed with pt, and pt to continue medical treatment as before, tolerating the increased glucotrol, to f/u labs today

## 2011-03-07 NOTE — Assessment & Plan Note (Signed)
stable overall by hx and exam, most recent data reviewed with pt, and pt to continue medical treatment as before  Lab Results  Component Value Date   LDLCALC 112* 09/04/2010   On high dose pravachol, pt not willing to change at this time, to check f/u labs, for lower chol diet, goal ldl < 70

## 2011-03-07 NOTE — Progress Notes (Signed)
Subjective:    Patient ID: Jeffrey Kaiser, male    DOB: [birth year], 77 y.o.   MRN: [id number]  HPI  Here to f/u; overall doing ok,  Pt denies chest pain, increased sob or doe, wheezing, orthopnea, PND, increased LE swelling, palpitations, dizziness or syncope.  Pt denies new neurological symptoms such as new headache, or facial or extremity weakness or numbness   Pt denies polydipsia, polyuria, or low sugar symptoms such as weakness or confusion improved with po intake.  Pt states overall good compliance with meds, trying to follow lower cholesterol, diabetic diet, wt overall stable but little exercise however. No new complaints.  Overall good compliance with treatment, and good medicine tolerability, just did not take BP meds this am, and is fasting. Past Medical History  Diagnosis Date  . Arthritis   . Diabetes mellitus   . Diverticulitis   . Glaucoma   . Hypertension   . Hyperlipidemia   . Hernia   . History of colonic diverticulitis 09/04/2010  . Anemia 09/04/2010  . DDD (degenerative disc disease), cervical 09/04/2010  . Diastolic dysfunction 09/04/2010  . Psoriasis 09/04/2010  . BPH (benign prostatic hyperplasia) 09/09/2010  . CAD (coronary artery disease)    Past Surgical History  Procedure Date  . Tonsillectomy 1952  . Broken legs 1981 and 1995  . S/p lipoma 2003    reports that he has never smoked. He does not have any smokeless tobacco history on file. He reports that he does not drink alcohol or use illicit drugs. family history includes Arthritis in his father and mother; Cancer in his other; Heart disease in his father and mother; and Hypertension in his father and mother. No Known Allergies Current Outpatient Prescriptions on File Prior to Visit  Medication Sig Dispense Refill  . clobetasol (TEMOVATE) 0.05 % external solution as directed.        . cyclobenzaprine (FLEXERIL) 5 MG tablet Take 1 tablet (5 mg total) by mouth 3 (three) times daily as needed for muscle spasms.  60  tablet  1  . glipiZIDE (GLIPIZIDE XL) 10 MG 24 hr tablet Take 1 tablet (10 mg total) by mouth daily.  90 tablet  3  . isosorbide mononitrate (IMDUR) 60 MG 24 hr tablet Take 1 tablet (60 mg total) by mouth daily. 1/2 tablet daily  30 tablet  11  . metFORMIN (GLUCOPHAGE) 500 MG tablet 2 by mouth in the morning, 1 by mouth at noon, and 2 by mouth every evening.       . metoprolol (LOPRESSOR) 50 MG tablet Take 1 tablet (50 mg total) by mouth daily.  30 tablet  11  . pravastatin (PRAVACHOL) 80 MG tablet Take 80 mg by mouth at bedtime.        . fosinopril (MONOPRIL) 20 MG tablet Take 20 mg by mouth daily.         Review of Systems Review of Systems  Constitutional: Negative for diaphoresis and unexpected weight change.  HENT: Negative for drooling and tinnitus.   Eyes: Negative for photophobia and visual disturbance.  Respiratory: Negative for choking and stridor.   Gastrointestinal: Negative for vomiting and blood in stool.  Genitourinary: Negative for hematuria and decreased urine volume.    Objective:   Physical Exam BP 140/78  Pulse 72  Temp(Src) 97.6 F (36.4 C) (Oral)  Ht 5\' 5"  (1.651 m)  Wt 157 lb 6 oz (71.385 kg)  BMI 26.19 kg/m2  SpO2 97% Physical Exam  VS noted Constitutional: Pt appears  well-developed and well-nourished.  HENT: Head: Normocephalic.  Right Ear: External ear normal.  Left Ear: External ear normal.  Eyes: Conjunctivae and EOM are normal. Pupils are equal, round, and reactive to light.  Neck: Normal range of motion. Neck supple.  Cardiovascular: Normal rate and regular rhythm.   Pulmonary/Chest: Effort normal and breath sounds normal.  Neurological: Pt is alert. No cranial nerve deficit.  Skin: Skin is warm. No erythema.  Psychiatric: Pt behavior is normal. Thought content normal.     Assessment & Plan:

## 2011-03-07 NOTE — Assessment & Plan Note (Signed)
stable overall by hx and exam, most recent data reviewed with pt, and pt to continue medical treatment as before  BP Readings from Last 3 Encounters:  03/07/11 140/78  11/20/10 152/82  09/04/10 140/62

## 2011-03-07 NOTE — Patient Instructions (Signed)
Continue all other medications as before Please go to LAB in the Basement for the blood and/or urine tests to be done today Please call the phone number [phone] (the PhoneTree System) for results of testing in 2-3 days;  When calling, simply dial the number, and when prompted enter the MRN number above (the Medical Record Number) and the # key, then the message should start. Please return in 6 months, or sooner if needed 

## 2011-04-09 ENCOUNTER — Other Ambulatory Visit: Payer: Self-pay

## 2011-04-09 MED ORDER — FOSINOPRIL SODIUM 20 MG PO TABS: 20.0000 mg | ORAL_TABLET | Freq: Every day | ORAL | Status: DC

## 2011-05-27 DIAGNOSIS — H409 Unspecified glaucoma: Secondary | ICD-10-CM | POA: Diagnosis not present

## 2011-05-27 DIAGNOSIS — H2510 Age-related nuclear cataract, unspecified eye: Secondary | ICD-10-CM | POA: Diagnosis not present

## 2011-05-27 DIAGNOSIS — H11009 Unspecified pterygium of unspecified eye: Secondary | ICD-10-CM | POA: Diagnosis not present

## 2011-05-27 DIAGNOSIS — H4011X0 Primary open-angle glaucoma, stage unspecified: Secondary | ICD-10-CM | POA: Diagnosis not present

## 2011-09-04 ENCOUNTER — Other Ambulatory Visit: Payer: Self-pay | Admitting: Internal Medicine

## 2011-09-04 ENCOUNTER — Encounter: Payer: Self-pay | Admitting: Internal Medicine

## 2011-09-04 ENCOUNTER — Ambulatory Visit (INDEPENDENT_AMBULATORY_CARE_PROVIDER_SITE_OTHER): Payer: Medicare Other | Admitting: Internal Medicine

## 2011-09-04 ENCOUNTER — Other Ambulatory Visit (INDEPENDENT_AMBULATORY_CARE_PROVIDER_SITE_OTHER): Payer: Medicare Other

## 2011-09-04 VITALS — BP 140/88 | HR 75 | Temp 97.1°F | Ht 64.0 in | Wt 156.5 lb

## 2011-09-04 DIAGNOSIS — I10 Essential (primary) hypertension: Secondary | ICD-10-CM | POA: Diagnosis not present

## 2011-09-04 DIAGNOSIS — E785 Hyperlipidemia, unspecified: Secondary | ICD-10-CM

## 2011-09-04 DIAGNOSIS — IMO0001 Reserved for inherently not codable concepts without codable children: Secondary | ICD-10-CM

## 2011-09-04 DIAGNOSIS — Z0000 Encounter for general adult medical examination without abnormal findings: Secondary | ICD-10-CM

## 2011-09-04 DIAGNOSIS — L408 Other psoriasis: Secondary | ICD-10-CM

## 2011-09-04 DIAGNOSIS — Z23 Encounter for immunization: Secondary | ICD-10-CM

## 2011-09-04 DIAGNOSIS — L409 Psoriasis, unspecified: Secondary | ICD-10-CM

## 2011-09-04 LAB — URINALYSIS, ROUTINE W REFLEX MICROSCOPIC
Hgb urine dipstick: NEGATIVE
Leukocytes, UA: NEGATIVE
Nitrite: NEGATIVE
Specific Gravity, Urine: 1.025 (ref 1.000–1.030)
Urine Glucose: NEGATIVE
Urobilinogen, UA: 0.2 (ref 0.0–1.0)

## 2011-09-04 LAB — HEPATIC FUNCTION PANEL
ALT: 19 U/L (ref 0–53)
Albumin: 4.1 g/dL (ref 3.5–5.2)
Alkaline Phosphatase: 44 U/L (ref 39–117)
Total Protein: 7.3 g/dL (ref 6.0–8.3)

## 2011-09-04 LAB — CBC WITH DIFFERENTIAL/PLATELET
Basophils Absolute: 0 10*3/uL (ref 0.0–0.1)
Hemoglobin: 13.2 g/dL (ref 13.0–17.0)
Lymphocytes Relative: 19.5 % (ref 12.0–46.0)
Monocytes Relative: 7.3 % (ref 3.0–12.0)
Neutro Abs: 6 10*3/uL (ref 1.4–7.7)
Neutrophils Relative %: 71.6 % (ref 43.0–77.0)
RBC: 4.55 Mil/uL (ref 4.22–5.81)
RDW: 13.6 % (ref 11.5–14.6)

## 2011-09-04 LAB — BASIC METABOLIC PANEL
CO2: 28 mEq/L (ref 19–32)
Chloride: 104 mEq/L (ref 96–112)
GFR: 105.41 mL/min (ref >60.00)
Glucose, Bld: 164 mg/dL — ABNORMAL HIGH (ref 70–99)
Sodium: 139 mEq/L (ref 135–145)

## 2011-09-04 LAB — HEMOGLOBIN A1C: Hgb A1c MFr Bld: 8.4 % — ABNORMAL HIGH (ref 4.6–6.5)

## 2011-09-04 MED ORDER — SITAGLIPTIN PHOSPHATE 50 MG PO TABS: 50.0000 mg | ORAL_TABLET | Freq: Every day | ORAL | Status: DC

## 2011-09-04 MED ORDER — PNEUMOCOCCAL VAC POLYVALENT 25 MCG/0.5ML IJ INJ: 0.5000 mL | INJECTION | Freq: Once | INTRAMUSCULAR | Status: DC

## 2011-09-04 MED ORDER — FOSINOPRIL SODIUM 20 MG PO TABS: 20.0000 mg | ORAL_TABLET | Freq: Every day | ORAL | Status: DC

## 2011-09-04 NOTE — Patient Instructions (Signed)
Continue all other medications as before Your refill was sent as you requested Please have the pharmacy call with any other refills you may need. Please go to LAB in the Basement for the blood and/or urine tests to be done today You will be contacted by phone if any changes need to be made immediately.  Otherwise, you will receive a letter about your results with an explanation. You had the pneumonia shot today You are otherwise up to date with prevention You will be contacted regarding the referral for: dermatology Please return in 6 months, or sooner if needed

## 2011-09-04 NOTE — Progress Notes (Signed)
Subjective:    Patient ID: Jeffrey Kaiser, male    DOB: 04-Jul-1925, 78 y.o.   MRN: [id number]  HPI  Here to f/u; overall doing ok,  Pt denies chest pain, increased sob or doe, wheezing, orthopnea, PND, increased LE swelling, palpitations, dizziness or syncope.  Pt denies new neurological symptoms such as new headache, or facial or extremity weakness or numbness   Pt denies polydipsia, polyuria, or low sugar symptoms such as weakness or confusion improved with po intake.  Pt states overall good compliance with meds, trying to follow lower cholesterol, diabetic diet, wt overall stable but little exercise however.  Pt continues to have recurring LBP without change in severity, bowel or bladder change, fever, wt loss,  worsening LE pain/numbness/weakness, gait change or falls.  Does also have worsening psoriasis not amenable to current tx, asks for referral to derm.  Pt denies fever, wt loss, night sweats, loss of appetite, or other constitutional symptoms   Past Medical History  Diagnosis Date  . Arthritis   . Diabetes mellitus   . Diverticulitis   . Glaucoma   . Hypertension   . Hyperlipidemia   . Hernia   . History of colonic diverticulitis 09/04/2010  . Anemia 09/04/2010  . DDD (degenerative disc disease), cervical 09/04/2010  . Diastolic dysfunction 09/04/2010  . Psoriasis 09/04/2010  . BPH (benign prostatic hyperplasia) 09/09/2010  . CAD (coronary artery disease)    Past Surgical History  Procedure Date  . Tonsillectomy 1952  . Broken legs 1981 and 1995  . S/p lipoma 2003    reports that he has never smoked. He does not have any smokeless tobacco history on file. He reports that he does not drink alcohol or use illicit drugs. family history includes Arthritis in his father and mother; Cancer in his other; Heart disease in his father and mother; and Hypertension in his father and mother. No Known Allergies Current Outpatient Prescriptions on File Prior to Visit  Medication Sig Dispense  Refill  . clobetasol (TEMOVATE) 0.05 % external solution as directed.        . fosinopril (MONOPRIL) 20 MG tablet Take 1 tablet (20 mg total) by mouth daily.  90 tablet  3  . glipiZIDE (GLIPIZIDE XL) 10 MG 24 hr tablet Take 1 tablet (10 mg total) by mouth daily.  90 tablet  3  . isosorbide mononitrate (IMDUR) 60 MG 24 hr tablet Take 1 tablet (60 mg total) by mouth daily. 1/2 tablet daily  30 tablet  11  . metFORMIN (GLUCOPHAGE) 500 MG tablet 2 by mouth in the morning, 1 by mouth at noon, and 2 by mouth every evening.       . metoprolol (LOPRESSOR) 50 MG tablet Take 1 tablet (50 mg total) by mouth daily.  30 tablet  11  . pravastatin (PRAVACHOL) 80 MG tablet Take 80 mg by mouth at bedtime.        . sitaGLIPtin (JANUVIA) 50 MG tablet Take 1 tablet (50 mg total) by mouth daily.  90 tablet  3   No current facility-administered medications on file prior to visit.   Review of Systems Review of Systems  Constitutional: Negative for diaphoresis and unexpected weight change.  Eyes: Negative for photophobia and visual disturbance.  Respiratory: Negative for choking and stridor.   Gastrointestinal: Negative for vomiting and blood in stool.  Genitourinary: Negative for hematuria and decreased urine volume.  Musculoskeletal: Negative for gait problem.  Skin: Negative for color change and wound.  Neurological: Negative  for tremors and numbness.     Objective:   Physical Exam BP 140/88  Pulse 75  Temp(Src) 97.1 F (36.2 C) (Oral)  Ht 5\' 4"  (1.626 m)  Wt 156 lb 8 oz (70.988 kg)  BMI 26.86 kg/m2  SpO2 96% Physical Exam  VS noted Constitutional: Pt appears well-developed and well-nourished.  HENT: Head: Normocephalic.  Right Ear: External ear normal.  Left Ear: External ear normal.  Eyes: Conjunctivae and EOM are normal. Pupils are equal, round, and reactive to light.  Neck: Normal range of motion. Neck supple.  Cardiovascular: Normal rate and regular rhythm.   Pulmonary/Chest: Effort normal  and breath sounds normal.  Neurological: Pt is alert. No cranial nerve deficit.  Skin: Skin is warm. No erythema. Large psoriasis to legs Psychiatric: Pt behavior is normal. Thought content normal. Not nervous or depressed    Assessment & Plan:

## 2011-09-07 ENCOUNTER — Encounter: Payer: Self-pay | Admitting: Internal Medicine

## 2011-09-07 NOTE — Assessment & Plan Note (Signed)
stable overall by hx and exam, most recent data reviewed with pt, and pt to continue medical treatment as before BP Readings from Last 3 Encounters:  09/04/11 140/88  03/07/11 140/78  11/20/10 152/82

## 2011-09-07 NOTE — Assessment & Plan Note (Signed)
stable overall by hx and exam, most recent data reviewed with pt, and pt to continue medical treatment as before Lab Results  Component Value Date   LDLCALC 112* 09/04/2010

## 2011-09-07 NOTE — Assessment & Plan Note (Signed)
Ok for refer to derm,  to f/u any worsening symptoms or concerns

## 2011-09-07 NOTE — Assessment & Plan Note (Addendum)
stable overall by hx and exam, most recent data reviewed with pt, and pt to continue medical treatment as before, for a1c today, may need further med for a1c > 7

## 2011-09-23 DIAGNOSIS — E11329 Type 2 diabetes mellitus with mild nonproliferative diabetic retinopathy without macular edema: Secondary | ICD-10-CM | POA: Diagnosis not present

## 2011-09-23 DIAGNOSIS — H4011X0 Primary open-angle glaucoma, stage unspecified: Secondary | ICD-10-CM | POA: Diagnosis not present

## 2011-09-23 DIAGNOSIS — H25049 Posterior subcapsular polar age-related cataract, unspecified eye: Secondary | ICD-10-CM | POA: Diagnosis not present

## 2011-10-14 DIAGNOSIS — H02419 Mechanical ptosis of unspecified eyelid: Secondary | ICD-10-CM | POA: Diagnosis not present

## 2011-10-14 DIAGNOSIS — H2510 Age-related nuclear cataract, unspecified eye: Secondary | ICD-10-CM | POA: Diagnosis not present

## 2011-10-14 DIAGNOSIS — H18419 Arcus senilis, unspecified eye: Secondary | ICD-10-CM | POA: Diagnosis not present

## 2011-10-20 DIAGNOSIS — H4011X0 Primary open-angle glaucoma, stage unspecified: Secondary | ICD-10-CM | POA: Diagnosis not present

## 2011-10-20 DIAGNOSIS — H1130 Conjunctival hemorrhage, unspecified eye: Secondary | ICD-10-CM | POA: Diagnosis not present

## 2011-10-20 DIAGNOSIS — H2510 Age-related nuclear cataract, unspecified eye: Secondary | ICD-10-CM | POA: Diagnosis not present

## 2011-10-27 ENCOUNTER — Other Ambulatory Visit: Payer: Self-pay | Admitting: Internal Medicine

## 2011-10-30 DIAGNOSIS — L408 Other psoriasis: Secondary | ICD-10-CM | POA: Diagnosis not present

## 2011-11-03 DIAGNOSIS — L408 Other psoriasis: Secondary | ICD-10-CM | POA: Diagnosis not present

## 2011-11-05 DIAGNOSIS — L408 Other psoriasis: Secondary | ICD-10-CM | POA: Diagnosis not present

## 2011-11-07 DIAGNOSIS — L408 Other psoriasis: Secondary | ICD-10-CM | POA: Diagnosis not present

## 2011-11-10 DIAGNOSIS — L408 Other psoriasis: Secondary | ICD-10-CM | POA: Diagnosis not present

## 2011-11-14 DIAGNOSIS — L408 Other psoriasis: Secondary | ICD-10-CM | POA: Diagnosis not present

## 2011-11-17 DIAGNOSIS — L408 Other psoriasis: Secondary | ICD-10-CM | POA: Diagnosis not present

## 2011-11-19 DIAGNOSIS — L408 Other psoriasis: Secondary | ICD-10-CM | POA: Diagnosis not present

## 2011-11-24 DIAGNOSIS — L408 Other psoriasis: Secondary | ICD-10-CM | POA: Diagnosis not present

## 2011-11-26 DIAGNOSIS — L408 Other psoriasis: Secondary | ICD-10-CM | POA: Diagnosis not present

## 2011-11-28 DIAGNOSIS — L408 Other psoriasis: Secondary | ICD-10-CM | POA: Diagnosis not present

## 2011-12-03 DIAGNOSIS — L408 Other psoriasis: Secondary | ICD-10-CM | POA: Diagnosis not present

## 2011-12-05 DIAGNOSIS — L408 Other psoriasis: Secondary | ICD-10-CM | POA: Diagnosis not present

## 2011-12-10 DIAGNOSIS — L408 Other psoriasis: Secondary | ICD-10-CM | POA: Diagnosis not present

## 2011-12-15 DIAGNOSIS — H2510 Age-related nuclear cataract, unspecified eye: Secondary | ICD-10-CM | POA: Diagnosis not present

## 2011-12-15 DIAGNOSIS — H269 Unspecified cataract: Secondary | ICD-10-CM | POA: Diagnosis not present

## 2011-12-16 DIAGNOSIS — H2510 Age-related nuclear cataract, unspecified eye: Secondary | ICD-10-CM | POA: Diagnosis not present

## 2011-12-18 DIAGNOSIS — L408 Other psoriasis: Secondary | ICD-10-CM | POA: Diagnosis not present

## 2011-12-20 ENCOUNTER — Other Ambulatory Visit: Payer: Self-pay | Admitting: Internal Medicine

## 2011-12-29 DIAGNOSIS — L408 Other psoriasis: Secondary | ICD-10-CM | POA: Diagnosis not present

## 2012-01-02 DIAGNOSIS — L408 Other psoriasis: Secondary | ICD-10-CM | POA: Diagnosis not present

## 2012-01-05 DIAGNOSIS — H269 Unspecified cataract: Secondary | ICD-10-CM | POA: Diagnosis not present

## 2012-01-05 DIAGNOSIS — H2510 Age-related nuclear cataract, unspecified eye: Secondary | ICD-10-CM | POA: Diagnosis not present

## 2012-01-12 DIAGNOSIS — H25049 Posterior subcapsular polar age-related cataract, unspecified eye: Secondary | ICD-10-CM | POA: Diagnosis not present

## 2012-01-23 DIAGNOSIS — L408 Other psoriasis: Secondary | ICD-10-CM | POA: Diagnosis not present

## 2012-01-30 DIAGNOSIS — L408 Other psoriasis: Secondary | ICD-10-CM | POA: Diagnosis not present

## 2012-02-03 ENCOUNTER — Ambulatory Visit (INDEPENDENT_AMBULATORY_CARE_PROVIDER_SITE_OTHER): Payer: Medicare Other | Admitting: Internal Medicine

## 2012-02-03 ENCOUNTER — Ambulatory Visit (INDEPENDENT_AMBULATORY_CARE_PROVIDER_SITE_OTHER)
Admission: RE | Admit: 2012-02-03 | Discharge: 2012-02-03 | Disposition: A | Discharge status: Home / Self Care | Payer: Medicare Other | Source: Ambulatory Visit | Attending: Internal Medicine | Admitting: Internal Medicine

## 2012-02-03 ENCOUNTER — Encounter: Payer: Self-pay | Admitting: Internal Medicine

## 2012-02-03 VITALS — BP 140/80 | HR 75 | Temp 98.2°F | Ht 65.0 in | Wt 158.5 lb

## 2012-02-03 DIAGNOSIS — K5900 Constipation, unspecified: Secondary | ICD-10-CM

## 2012-02-03 DIAGNOSIS — R109 Unspecified abdominal pain: Secondary | ICD-10-CM

## 2012-02-03 DIAGNOSIS — I10 Essential (primary) hypertension: Secondary | ICD-10-CM

## 2012-02-03 DIAGNOSIS — K4090 Unilateral inguinal hernia, without obstruction or gangrene, not specified as recurrent: Secondary | ICD-10-CM | POA: Insufficient documentation

## 2012-02-03 DIAGNOSIS — M25511 Pain in right shoulder: Secondary | ICD-10-CM

## 2012-02-03 DIAGNOSIS — IMO0001 Reserved for inherently not codable concepts without codable children: Secondary | ICD-10-CM

## 2012-02-03 DIAGNOSIS — M25519 Pain in unspecified shoulder: Secondary | ICD-10-CM

## 2012-02-03 DIAGNOSIS — R141 Gas pain: Secondary | ICD-10-CM | POA: Diagnosis not present

## 2012-02-03 NOTE — Assessment & Plan Note (Signed)
stable overall by hx and exam, most recent data reviewed with pt, and pt to continue medical treatment as before BP Readings from Last 3 Encounters:  02/03/12 140/80  09/04/11 140/88  03/07/11 140/78

## 2012-02-03 NOTE — Assessment & Plan Note (Signed)
See d/w pt regarding abd pain today

## 2012-02-03 NOTE — Assessment & Plan Note (Addendum)
stable overall by hx and exam, most recent data reviewed with pt, and pt to continue medical treatment as before Lab Results  Component Value Date   HGBA1C 8.4* 09/04/2011   For f/u lab today

## 2012-02-03 NOTE — Progress Notes (Signed)
Subjective:    Patient ID: Jeffrey Kaiser, male    DOB: 1926/03/14, 78 y.o.   MRN: [id number]  HPI Here for acute visit, per pt at the behest of his wife, had pain to the anterior right shoulder and lateral right chest today when awoke but resolved after 30 min without other neck, chest, back or arm pain or symptoms.  Does have ongoing constipation, some worse recently with increased distension and discomfort more right sided today.  Last BM yesterday , seemed normal color but some hard and small volume.  Denies worsening reflux, dysphagia, other abd pain, n/v, or blood.  Last colonscopy 2011.  No fever, though he is concerned about diverticulitis as has hx of this.  Pt denies increased sob or doe, wheezing, orthopnea, PND, increased LE swelling, palpitations, dizziness or syncope. Pt denies new neurological symptoms such as new headache, or facial or extremity weakness or numbness   Pt denies polydipsia, polyuria.   Pt denies fever, wt loss, night sweats, loss of appetite, or other constitutional symptoms Past Medical History  Diagnosis Date  . Arthritis   . Diabetes mellitus   . Diverticulitis   . Glaucoma   . Hypertension   . Hyperlipidemia   . Hernia   . History of colonic diverticulitis 09/04/2010  . Anemia 09/04/2010  . DDD (degenerative disc disease), cervical 09/04/2010  . Diastolic dysfunction 09/04/2010  . Psoriasis 09/04/2010  . BPH (benign prostatic hyperplasia) 09/09/2010  . CAD (coronary artery disease)    Past Surgical History  Procedure Date  . Tonsillectomy 1952  . Broken legs 1981 and 1995  . S/p lipoma 2003    reports that he has never smoked. He does not have any smokeless tobacco history on file. He reports that he does not drink alcohol or use illicit drugs. family history includes Arthritis in his father and mother; Cancer in his other; Heart disease in his father and mother; and Hypertension in his father and mother. No Known Allergies Current Outpatient Prescriptions  on File Prior to Visit  Medication Sig Dispense Refill  . aspirin 81 MG tablet Take 81 mg by mouth daily.      . clobetasol (TEMOVATE) 0.05 % external solution as directed.        . fosinopril (MONOPRIL) 20 MG tablet Take 1 tablet (20 mg total) by mouth daily.  90 tablet  3  . glipiZIDE (GLUCOTROL XL) 10 MG 24 hr tablet TAKE 1 TABLET BY MOUTH EVERY DAY  90 tablet  3  . isosorbide mononitrate (IMDUR) 60 MG 24 hr tablet Take 1 tablet (60 mg total) by mouth daily. 1/2 tablet daily  30 tablet  11  . metFORMIN (GLUCOPHAGE) 500 MG tablet 2 by mouth in the morning, 1 by mouth at noon, and 2 by mouth every evening.       . metoprolol (LOPRESSOR) 50 MG tablet TAKE 1 TABLET BY MOUTH EVERY DAY  30 tablet  5  . pravastatin (PRAVACHOL) 80 MG tablet Take 80 mg by mouth at bedtime.        . sitaGLIPtin (JANUVIA) 50 MG tablet Take 1 tablet (50 mg total) by mouth daily.  90 tablet  3   Current Facility-Administered Medications on File Prior to Visit  Medication Dose Route Frequency Provider Last Rate Last Dose  . pneumococcal 23 valent vaccine (PNU-IMMUNE) injection 0.5 mL  0.5 mL Intramuscular Once Corwin Levins, MD       Review of Systems  Constitutional: Negative for diaphoresis and  unexpected weight change.  HENT: Negative for tinnitus.   Eyes: Negative for photophobia and visual disturbance.  Respiratory: Negative for choking and stridor.   Gastrointestinal: Negative for vomiting and blood in stool.  Genitourinary: Negative for hematuria and decreased urine volume.  Musculoskeletal: Negative for gait problem.  Skin: Negative for color change and wound.  Neurological: Negative for tremors and numbness.  Psychiatric/Behavioral: Negative for decreased concentration. The patient is not hyperactive.       Objective:   Physical Exam BP 140/80  Pulse 75  Temp 98.2 F (36.8 C) (Oral)  Ht 5\' 5"  (1.651 m)  Wt 158 lb 8 oz (71.895 kg)  BMI 26.38 kg/m2  SpO2 97% Physical Exam  VS noted, non toxic but  uncomfortable Constitutional: Pt appears well-developed and well-nourished.  HENT: Head: Normocephalic.  Right Ear: External ear normal.  Left Ear: External ear normal.  Eyes: Conjunctivae and EOM are normal. Pupils are equal, round, and reactive to light.  Neck: Normal range of motion. Neck supple.  Cardiovascular: Normal rate and regular rhythm.   Pulmonary/Chest: Effort normal and breath sounds normal.  Abd:  Soft, mild distended, + BS with ill defined right upper/lower mild tender, no guarding or rebound Neurological: Pt is alert. Not confused , motor/gait intact Skin: Skin is warm. No erythema.  No chest wall tender or rash on the right Right shoulder NT, but stiff with mild decreased ROM with passive discomfort towards full forward elevation Psychiatric: Pt behavior is normal. Thought content normal.     Assessment & Plan:

## 2012-02-03 NOTE — Patient Instructions (Addendum)
Ok to start the Linzess 145 pills at 1 per day (for constipation) and you are given the prescription as well If too expensive, please take Miralax daily, and Dulcolox as needed in addition to that Please go to XRAY in the Basement for the x-ray test Please go to LAB in the Basement for the blood and/or urine tests to be done today You will be contacted by phone if any changes need to be made immediately.  Otherwise, you will receive a letter about your results with an explanation. Please remember to sign up for My Chart at your earliest convenience, as this will be important to you in the future with finding out test results. Please return in 6 mo with Lab testing done 3-5 days before

## 2012-02-03 NOTE — Assessment & Plan Note (Signed)
Exam not overly concerning for acute, likely MSK pain that he awoke with today, declines ecg, currently pain free, tylenol prn,  to f/u any worsening symptoms or concerns

## 2012-02-03 NOTE — Assessment & Plan Note (Addendum)
C/w constipation most likely but cant r/o other, afeb, no n/v, no blood;  For now for routine films including cxr, and labs, consider abd u/s or ct pending other results and clinical course; gave linzess sample x 2 wks at 145 per day, also rx but likely expensive and may do just as well with miralax daily

## 2012-02-04 ENCOUNTER — Other Ambulatory Visit (INDEPENDENT_AMBULATORY_CARE_PROVIDER_SITE_OTHER): Payer: Medicare Other

## 2012-02-04 ENCOUNTER — Encounter: Payer: Self-pay | Admitting: Internal Medicine

## 2012-02-04 DIAGNOSIS — R109 Unspecified abdominal pain: Secondary | ICD-10-CM

## 2012-02-04 LAB — CBC WITH DIFFERENTIAL/PLATELET
Basophils Relative: 0.6 % (ref 0.0–3.0)
Eosinophils Absolute: 0.2 10*3/uL (ref 0.0–0.7)
Hemoglobin: 13.5 g/dL (ref 13.0–17.0)
MCHC: 32.5 g/dL (ref 30.0–36.0)
MCV: 87.5 fl (ref 78.0–100.0)
Monocytes Absolute: 0.6 10*3/uL (ref 0.1–1.0)
Neutro Abs: 4.8 10*3/uL (ref 1.4–7.7)
RBC: 4.75 Mil/uL (ref 4.22–5.81)

## 2012-02-04 LAB — URINALYSIS, ROUTINE W REFLEX MICROSCOPIC
Hgb urine dipstick: NEGATIVE
Ketones, ur: NEGATIVE
Specific Gravity, Urine: <=1.005 (ref 1.000–1.030)
Urine Glucose: NEGATIVE
Urobilinogen, UA: 0.2 (ref 0.0–1.0)

## 2012-02-04 LAB — BASIC METABOLIC PANEL
BUN: 17 mg/dL (ref 6–23)
CO2: 31 mEq/L (ref 19–32)
Chloride: 103 mEq/L (ref 96–112)
Creatinine, Ser: 1 mg/dL (ref 0.4–1.5)
Glucose, Bld: 186 mg/dL — ABNORMAL HIGH (ref 70–99)
Potassium: 5.4 mEq/L — ABNORMAL HIGH (ref 3.5–5.1)

## 2012-02-04 LAB — HEPATIC FUNCTION PANEL
ALT: 21 U/L (ref 0–53)
AST: 29 U/L (ref 0–37)
Albumin: 3.9 g/dL (ref 3.5–5.2)
Total Protein: 7.5 g/dL (ref 6.0–8.3)

## 2012-02-04 LAB — HEMOGLOBIN A1C: Hgb A1c MFr Bld: 7.8 % — ABNORMAL HIGH (ref 4.6–6.5)

## 2012-02-04 LAB — LIPASE: Lipase: 40 U/L (ref 11.0–59.0)

## 2012-02-04 LAB — LIPID PANEL
Cholesterol: 191 mg/dL (ref 0–200)
VLDL: 32.2 mg/dL (ref 0.0–40.0)

## 2012-02-04 LAB — H. PYLORI ANTIBODY, IGG: H Pylori IgG: NEGATIVE

## 2012-02-06 DIAGNOSIS — L408 Other psoriasis: Secondary | ICD-10-CM | POA: Diagnosis not present

## 2012-02-25 DIAGNOSIS — H4011X0 Primary open-angle glaucoma, stage unspecified: Secondary | ICD-10-CM | POA: Diagnosis not present

## 2012-02-26 ENCOUNTER — Ambulatory Visit: Payer: Medicare Other | Admitting: Internal Medicine

## 2012-02-26 DIAGNOSIS — Z0289 Encounter for other administrative examinations: Secondary | ICD-10-CM

## 2012-03-20 ENCOUNTER — Other Ambulatory Visit: Payer: Self-pay | Admitting: Internal Medicine

## 2012-03-29 ENCOUNTER — Telehealth: Payer: Self-pay | Admitting: Internal Medicine

## 2012-03-29 ENCOUNTER — Other Ambulatory Visit: Payer: Self-pay | Admitting: Internal Medicine

## 2012-03-29 NOTE — Telephone Encounter (Signed)
Patient Information:  Caller Name: Luster Landsberg  Phone: [phone]  Patient: Jeffrey Kaiser, Jeffrey Kaiser  Gender: Male  DOB: [birth year]  Age: 78 Years  PCP: Oliver Barre (Adults only)   Does the office need to follow up with this patient?: No  Instructions For The Office: N/A  RN Note:  states medication requested 03/18/12 from [facility],  unsure which medication, was not filled, states will call the pharmacy to resend   Reason For Call & Symptoms: medication  Reviewed Health History In EMR: Yes  Reviewed Medications In EMR: Yes  Reviewed Allergies In EMR: Yes  Reviewed Surgeries / Procedures: Yes  Date of Onset of Symptoms: 03/29/2012  Guideline(s) Used:  No Protocol Available - Information Only  Disposition Per Guideline:  Home Care  Reason For Disposition Reached:  Information only question and nurse able to answer  Advice Given:  Call Back If:  New symptoms develop;  You become worse.

## 2012-03-30 NOTE — Telephone Encounter (Signed)
Called renee and they were requesting pravastatin and was sent in to [facility] [address] on 03/29/12.

## 2012-03-30 NOTE — Telephone Encounter (Signed)
Called left message to cal back 

## 2012-05-13 ENCOUNTER — Other Ambulatory Visit (INDEPENDENT_AMBULATORY_CARE_PROVIDER_SITE_OTHER): Payer: Medicare Other

## 2012-05-13 ENCOUNTER — Ambulatory Visit (INDEPENDENT_AMBULATORY_CARE_PROVIDER_SITE_OTHER): Payer: Medicare Other | Admitting: Internal Medicine

## 2012-05-13 ENCOUNTER — Encounter: Payer: Self-pay | Admitting: Internal Medicine

## 2012-05-13 VITALS — BP 160/80 | HR 78 | Temp 97.4°F | Ht 65.0 in | Wt 159.0 lb

## 2012-05-13 DIAGNOSIS — E785 Hyperlipidemia, unspecified: Secondary | ICD-10-CM

## 2012-05-13 DIAGNOSIS — Z23 Encounter for immunization: Secondary | ICD-10-CM

## 2012-05-13 DIAGNOSIS — H4011X0 Primary open-angle glaucoma, stage unspecified: Secondary | ICD-10-CM | POA: Diagnosis not present

## 2012-05-13 DIAGNOSIS — I10 Essential (primary) hypertension: Secondary | ICD-10-CM | POA: Diagnosis not present

## 2012-05-13 LAB — HEPATIC FUNCTION PANEL
ALT: 16 U/L (ref 0–53)
AST: 19 U/L (ref 0–37)
Albumin: 4 g/dL (ref 3.5–5.2)
Alkaline Phosphatase: 57 U/L (ref 39–117)
Total Protein: 7.7 g/dL (ref 6.0–8.3)

## 2012-05-13 LAB — BASIC METABOLIC PANEL
BUN: 16 mg/dL (ref 6–23)
CO2: 31 mEq/L (ref 19–32)
Chloride: 100 mEq/L (ref 96–112)
GFR: 88.67 mL/min (ref >60.00)
Glucose, Bld: 217 mg/dL — ABNORMAL HIGH (ref 70–99)
Potassium: 5.1 mEq/L (ref 3.5–5.1)
Sodium: 137 mEq/L (ref 135–145)

## 2012-05-13 LAB — LIPID PANEL
Cholesterol: 186 mg/dL (ref 0–200)
Triglycerides: 205 mg/dL — ABNORMAL HIGH (ref 0.0–149.0)
VLDL: 41 mg/dL — ABNORMAL HIGH (ref 0.0–40.0)

## 2012-05-13 LAB — HEMOGLOBIN A1C: Hgb A1c MFr Bld: 8.9 % — ABNORMAL HIGH (ref 4.6–6.5)

## 2012-05-13 NOTE — Assessment & Plan Note (Addendum)
stable overall by history and exam,  and pt to continue medical treatment as before,  to f/u any worsening symptoms or concerns, for a1c today

## 2012-05-13 NOTE — Progress Notes (Signed)
Subjective:    Patient ID: Jeffrey Kaiser, male    DOB: 10-08-1925, 79 y.o.   MRN: [id number]  HPI  Here to f/u; overall doing ok,  Pt denies chest pain, increased sob or doe, wheezing, orthopnea, PND, increased LE swelling, palpitations, dizziness or syncope.  Pt denies polydipsia, polyuria, or low sugar symptoms such as weakness or confusion improved with po intake.  Pt denies new neurological symptoms such as new headache, or facial or extremity weakness or numbness.   Pt states overall good compliance with meds, has been trying to follow lower cholesterol, diabetic diet, with wt overall stable,  but little exercise however. Does have doxycycline per opthomology for what appears to be bilat conjuntivitis, wants to know if ok to take with other meds.   Due for flu shot Past Medical History  Diagnosis Date  . Arthritis   . Diabetes mellitus   . Diverticulitis   . Glaucoma   . Hypertension   . Hyperlipidemia   . Hernia   . History of colonic diverticulitis 09/04/2010  . Anemia 09/04/2010  . DDD (degenerative disc disease), cervical 09/04/2010  . Diastolic dysfunction 09/04/2010  . Psoriasis 09/04/2010  . BPH (benign prostatic hyperplasia) 09/09/2010  . CAD (coronary artery disease)    Past Surgical History  Procedure Date  . Tonsillectomy 1952  . Broken legs 1981 and 1995  . S/p lipoma 2003    reports that he has never smoked. He does not have any smokeless tobacco history on file. He reports that he does not drink alcohol or use illicit drugs. family history includes Arthritis in his father and mother; Cancer in his other; Heart disease in his father and mother; and Hypertension in his father and mother. No Known Allergies Current Outpatient Prescriptions on File Prior to Visit  Medication Sig Dispense Refill  . aspirin 81 MG tablet Take 81 mg by mouth daily.      . clobetasol (TEMOVATE) 0.05 % external solution as directed.        . fosinopril (MONOPRIL) 20 MG tablet Take 1 tablet (20  mg total) by mouth daily.  90 tablet  3  . glipiZIDE (GLUCOTROL XL) 10 MG 24 hr tablet TAKE 1 TABLET BY MOUTH EVERY DAY  90 tablet  3  . isosorbide mononitrate (IMDUR) 60 MG 24 hr tablet TAKE 1/2 TABLET DAILY  30 tablet  11  . metFORMIN (GLUCOPHAGE) 500 MG tablet 2 by mouth in the morning, 1 by mouth at noon, and 2 by mouth every evening.       . metoprolol (LOPRESSOR) 50 MG tablet TAKE 1 TABLET BY MOUTH EVERY DAY  30 tablet  5  . pravastatin (PRAVACHOL) 80 MG tablet Take 80 mg by mouth at bedtime.        . sitaGLIPtin (JANUVIA) 50 MG tablet Take 1 tablet (50 mg total) by mouth daily.  90 tablet  3   Review of Systems  Constitutional: Negative for unexpected weight change, or unusual diaphoresis  HENT: Negative for tinnitus.   Eyes: Negative for photophobia and visual disturbance.  Respiratory: Negative for choking and stridor.   Gastrointestinal: Negative for vomiting and blood in stool.  Genitourinary: Negative for hematuria and decreased urine volume.  Musculoskeletal: Negative for acute joint swelling Skin: Negative for color change and wound.  Neurological: Negative for tremors and numbness other than noted  Psychiatric/Behavioral: Negative for decreased concentration or  hyperactivity.       Objective:   Physical Exam BP 160/80  Pulse 78  Temp 97.4 F (36.3 C) (Oral)  Ht 5\' 5"  (1.651 m)  Wt 159 lb (72.122 kg)  BMI 26.46 kg/m2  SpO2 97% VS noted, not ill appearing Constitutional: Pt appears well-developed and well-nourished.  HENT: Head: NCAT.  Right Ear: External ear normal.  Left Ear: External ear normal.  Eyes: Conjunctivae and EOM are normal. Pupils are equal, round, and reactive to light.  Neck: Normal range of motion. Neck supple.  Cardiovascular: Normal rate and regular rhythm.   Pulmonary/Chest: Effort normal and breath sounds normal.  Neurological: Pt is alert. Not confused  Skin: Skin is warm. No erythema. No LE edema Psychiatric: Pt behavior is normal.  Thought content normal.     Assessment & Plan:

## 2012-05-13 NOTE — Assessment & Plan Note (Signed)
stable overall by history and exam, recent data reviewed with pt, and pt to continue medical treatment as before,  to f/u any worsening symptoms or concerns Lab Results  Component Value Date   LDLCALC 117* 02/04/2012

## 2012-05-13 NOTE — Patient Instructions (Addendum)
You had the flu shot today Your EKG was ok today OK to take the doxycycline as prescribed by your opthomologist for your eyes Please continue all other medications as before, and refills have been done if requested. Please go to the LAB in the Basement (turn left off the elevator) for the tests to be done today You will be contacted by phone if any changes need to be made immediately.  Otherwise, you will receive a letter about your results with an explanation, but please check with MyChart first Thank you for enrolling in MyChart. Please follow the instructions below to securely access your online medical record. MyChart allows you to send messages to your doctor, view your test results, renew your prescriptions, schedule appointments, and more. Please return in 6 months, or sooner if needed

## 2012-05-13 NOTE — Assessment & Plan Note (Signed)
ECG reviewed as per emr, elevated today, recent data reviewed with pt, and pt to continue medical treatment as before,  to f/u any worsening symptoms or concerns BP Readings from Last 3 Encounters:  05/13/12 160/80  02/03/12 140/80  09/04/11 140/88

## 2012-05-14 ENCOUNTER — Other Ambulatory Visit: Payer: Self-pay | Admitting: Internal Medicine

## 2012-05-14 MED ORDER — SITAGLIPTIN PHOSPHATE 100 MG PO TABS: 100.0000 mg | ORAL_TABLET | Freq: Every day | ORAL | Status: DC

## 2012-07-12 DIAGNOSIS — H4011X0 Primary open-angle glaucoma, stage unspecified: Secondary | ICD-10-CM | POA: Diagnosis not present

## 2012-10-27 ENCOUNTER — Other Ambulatory Visit: Payer: Self-pay | Admitting: Internal Medicine

## 2012-10-27 NOTE — Telephone Encounter (Signed)
r 

## 2012-11-17 DIAGNOSIS — H4011X0 Primary open-angle glaucoma, stage unspecified: Secondary | ICD-10-CM | POA: Diagnosis not present

## 2012-12-03 ENCOUNTER — Encounter: Payer: Self-pay | Admitting: Internal Medicine

## 2012-12-03 ENCOUNTER — Other Ambulatory Visit (INDEPENDENT_AMBULATORY_CARE_PROVIDER_SITE_OTHER): Payer: Medicare Other

## 2012-12-03 ENCOUNTER — Ambulatory Visit (INDEPENDENT_AMBULATORY_CARE_PROVIDER_SITE_OTHER): Payer: Medicare Other | Admitting: Internal Medicine

## 2012-12-03 VITALS — BP 110/80 | HR 64 | Temp 98.1°F | Ht 65.0 in | Wt 159.2 lb

## 2012-12-03 DIAGNOSIS — IMO0001 Reserved for inherently not codable concepts without codable children: Secondary | ICD-10-CM

## 2012-12-03 DIAGNOSIS — E785 Hyperlipidemia, unspecified: Secondary | ICD-10-CM

## 2012-12-03 DIAGNOSIS — M5030 Other cervical disc degeneration, unspecified cervical region: Secondary | ICD-10-CM

## 2012-12-03 DIAGNOSIS — M542 Cervicalgia: Secondary | ICD-10-CM | POA: Insufficient documentation

## 2012-12-03 DIAGNOSIS — I10 Essential (primary) hypertension: Secondary | ICD-10-CM | POA: Diagnosis not present

## 2012-12-03 DIAGNOSIS — Z23 Encounter for immunization: Secondary | ICD-10-CM | POA: Diagnosis not present

## 2012-12-03 LAB — URINALYSIS, ROUTINE W REFLEX MICROSCOPIC
Bilirubin Urine: NEGATIVE
Ketones, ur: NEGATIVE
Leukocytes, UA: NEGATIVE
RBC / HPF: NONE SEEN (ref 0–?)
Specific Gravity, Urine: 1.015 (ref 1.000–1.030)
Total Protein, Urine: NEGATIVE
Urine Glucose: NEGATIVE
pH: 6 (ref 5.0–8.0)

## 2012-12-03 LAB — CBC WITH DIFFERENTIAL/PLATELET
Basophils Absolute: 0 10*3/uL (ref 0.0–0.1)
Eosinophils Absolute: 0.2 10*3/uL (ref 0.0–0.7)
Hemoglobin: 13.2 g/dL (ref 13.0–17.0)
Lymphocytes Relative: 19.5 % (ref 12.0–46.0)
MCHC: 33.4 g/dL (ref 30.0–36.0)
Monocytes Relative: 7.4 % (ref 3.0–12.0)
Neutrophils Relative %: 70.8 % (ref 43.0–77.0)
Platelets: 202 10*3/uL (ref 150.0–400.0)
RDW: 14.4 % (ref 11.5–14.6)

## 2012-12-03 LAB — BASIC METABOLIC PANEL
BUN: 21 mg/dL (ref 6–23)
Chloride: 101 mEq/L (ref 96–112)
Creatinine, Ser: 0.9 mg/dL (ref 0.4–1.5)
GFR: 87.39 mL/min (ref >60.00)
Glucose, Bld: 102 mg/dL — ABNORMAL HIGH (ref 70–99)
Potassium: 5.1 mEq/L (ref 3.5–5.1)

## 2012-12-03 LAB — TSH: TSH: 4.88 u[IU]/mL (ref 0.35–5.50)

## 2012-12-03 LAB — HEPATIC FUNCTION PANEL
ALT: 15 U/L (ref 0–53)
AST: 20 U/L (ref 0–37)
Albumin: 4.1 g/dL (ref 3.5–5.2)

## 2012-12-03 LAB — LIPID PANEL
Cholesterol: 149 mg/dL (ref 0–200)
HDL: 37.8 mg/dL — ABNORMAL LOW (ref >39.00)
Total CHOL/HDL Ratio: 4
Triglycerides: 92 mg/dL (ref 0.0–149.0)

## 2012-12-03 LAB — HEMOGLOBIN A1C: Hgb A1c MFr Bld: 7.7 % — ABNORMAL HIGH (ref 4.6–6.5)

## 2012-12-03 MED ORDER — CYCLOBENZAPRINE HCL 5 MG PO TABS: 5.0000 mg | ORAL_TABLET | Freq: Three times a day (TID) | ORAL | Status: DC | PRN

## 2012-12-03 NOTE — Progress Notes (Addendum)
Subjective:    Patient ID: Jeffrey Kaiser, male    DOB: 1925-05-26, 79 y.o.   MRN: [id number]  HPI Here to f/u; overall doing ok,  Pt denies chest pain, increased sob or doe, wheezing, orthopnea, PND, increased LE swelling, palpitations, dizziness or syncope.  Pt denies polydipsia, polyuria, or low sugar symptoms such as weakness or confusion improved with po intake.  Pt denies new neurological symptoms such as new headache, or facial or extremity weakness or numbness.   Pt states overall good compliance with meds, has been trying to follow lower cholesterol, diabetic diet, with wt overall stable,  but little exercise however.  Does have ongoing right > left neck pain, seems to be stiff, worse in the am, seems to "pull my head down", was better previously by muscle relaxer but for some reason became afraid to cont using it Past Medical History  Diagnosis Date  . Arthritis   . Diabetes mellitus   . Diverticulitis   . Glaucoma   . Hypertension   . Hyperlipidemia   . Hernia   . History of colonic diverticulitis 09/04/2010  . Anemia 09/04/2010  . DDD (degenerative disc disease), cervical 09/04/2010  . Diastolic dysfunction 09/04/2010  . Psoriasis 09/04/2010  . BPH (benign prostatic hyperplasia) 09/09/2010  . CAD (coronary artery disease)    Past Surgical History  Procedure Laterality Date  . Tonsillectomy  1952  . Broken legs  1981 and 1995  . S/p lipoma  2003    reports that he has never smoked. He does not have any smokeless tobacco history on file. He reports that he does not drink alcohol or use illicit drugs. family history includes Arthritis in his father and mother; Cancer in his other; Heart disease in his father and mother; Hypertension in his father and mother. No Known Allergies Current Outpatient Prescriptions on File Prior to Visit  Medication Sig Dispense Refill  . aspirin 81 MG tablet Take 81 mg by mouth daily.      . clobetasol (TEMOVATE) 0.05 % external solution as directed.         . fosinopril (MONOPRIL) 20 MG tablet Take 1 tablet (20 mg total) by mouth daily.  90 tablet  3  . glipiZIDE (GLUCOTROL XL) 10 MG 24 hr tablet TAKE 1 TABLET BY MOUTH EVERY DAY  90 tablet  3  . isosorbide mononitrate (IMDUR) 60 MG 24 hr tablet TAKE 1/2 TABLET DAILY  30 tablet  11  . metFORMIN (GLUCOPHAGE) 500 MG tablet 2 by mouth in the morning, 1 by mouth at noon, and 2 by mouth every evening.       . metoprolol (LOPRESSOR) 50 MG tablet TAKE 1 TABLET BY MOUTH EVERY DAY  30 tablet  5  . pravastatin (PRAVACHOL) 80 MG tablet Take 80 mg by mouth at bedtime.        . sitaGLIPtin (JANUVIA) 100 MG tablet Take 1 tablet (100 mg total) by mouth daily.  90 tablet  3   No current facility-administered medications on file prior to visit.   Review of Systems  Constitutional: Negative for unexpected weight change, or unusual diaphoresis  HENT: Negative for tinnitus.   Eyes: Negative for photophobia and visual disturbance.  Respiratory: Negative for choking and stridor.   Gastrointestinal: Negative for vomiting and blood in stool.  Genitourinary: Negative for hematuria and decreased urine volume.  Musculoskeletal: Negative for acute joint swelling Skin: Negative for color change and wound.  Neurological: Negative for tremors and numbness other than  noted  Psychiatric/Behavioral: Negative for decreased concentration or  hyperactivity.       Objective:   Physical Exam BP 110/80  Pulse 64  Temp(Src) 98.1 F (36.7 C) (Oral)  Ht 5\' 5"  (1.651 m)  Wt 159 lb 4 oz (72.235 kg)  BMI 26.5 kg/m2  SpO2 97% VS noted, frail but bright in appearance, affect Constitutional: Pt appears well-developed and well-nourished.  HENT: Head: NCAT.  Right Ear: External ear normal.  Left Ear: External ear normal.  Eyes: Conjunctivae and EOM are normal. Pupils are equal, round, and reactive to light.  Neck: Normal range of motion. Neck supple. except for discomfort and mild reduced ROM with horizontal movement right >  left Cardiovascular: Normal rate and regular rhythm.   Pulmonary/Chest: Effort normal and breath sounds normal - no rales or wheezing.  Neurological: Pt is alert. Not confused , UE/LE motor 5/5, gait slowed but not unsteady Skin: Skin is warm. No erythema.  Psychiatric: Pt behavior is normal. Thought content normal.     Assessment & Plan:  Quality Measures addressed:  ACE/ARB therapy for CAD, Diabetes, and/or LVSD: pt declines further medication

## 2012-12-03 NOTE — Patient Instructions (Signed)
Please continue all other medications as before, and refills have been done if requested, including the muscle relaxer You had the flu shot today Please have the pharmacy call with any other refills you may need.  Please go to the LAB in the Basement (turn left off the elevator) for the tests to be done today You will be contacted by phone if any changes need to be made immediately.  Otherwise, you will receive a letter about your results with an explanation, but please check with MyChart first.  Please remember to sign up for My Chart if you have not done so, as this will be important to you in the future with finding out test results, communicating by private email, and scheduling acute appointments online when needed.  Please return in 6 months, or sooner if needed

## 2012-12-03 NOTE — Assessment & Plan Note (Signed)
stable overall by history and exam, recent data reviewed with pt, and pt to continue medical treatment as before,  to f/u any worsening symptoms or concerns BP Readings from Last 3 Encounters:  12/03/12 110/80  05/13/12 160/80  02/03/12 140/80

## 2012-12-03 NOTE — Assessment & Plan Note (Signed)
Ok for flexeril prn, likely underlying c-spine djd/ddd,  to f/u any worsening symptoms or concerns

## 2012-12-03 NOTE — Assessment & Plan Note (Signed)
stable overall by history and exam, recent data reviewed with pt, and pt to continue medical treatment as before,  to f/u any worsening symptoms or concerns Lab Results  Component Value Date   LDLCALC 117* 02/04/2012

## 2012-12-03 NOTE — Assessment & Plan Note (Addendum)
stable overall by history and exam, recent data reviewed with pt, and pt to continue medical treatment as before  - now in increased januvia to 100 mg,  to f/u any worsening symptoms or concerns Lab Results  Component Value Date   HGBA1C 8.9* 05/13/2012

## 2012-12-27 ENCOUNTER — Other Ambulatory Visit: Payer: Self-pay | Admitting: Internal Medicine

## 2012-12-27 NOTE — Telephone Encounter (Signed)
Refill done.  

## 2013-01-27 DIAGNOSIS — H4011X0 Primary open-angle glaucoma, stage unspecified: Secondary | ICD-10-CM | POA: Diagnosis not present

## 2013-01-27 DIAGNOSIS — H01009 Unspecified blepharitis unspecified eye, unspecified eyelid: Secondary | ICD-10-CM | POA: Diagnosis not present

## 2013-03-16 DIAGNOSIS — E11329 Type 2 diabetes mellitus with mild nonproliferative diabetic retinopathy without macular edema: Secondary | ICD-10-CM | POA: Diagnosis not present

## 2013-03-16 DIAGNOSIS — H4011X0 Primary open-angle glaucoma, stage unspecified: Secondary | ICD-10-CM | POA: Diagnosis not present

## 2013-03-24 ENCOUNTER — Other Ambulatory Visit: Payer: Self-pay | Admitting: Internal Medicine

## 2013-04-07 ENCOUNTER — Other Ambulatory Visit: Payer: Self-pay | Admitting: Internal Medicine

## 2013-04-26 ENCOUNTER — Encounter: Payer: Self-pay | Admitting: Internal Medicine

## 2013-04-26 ENCOUNTER — Ambulatory Visit (INDEPENDENT_AMBULATORY_CARE_PROVIDER_SITE_OTHER): Payer: Medicare Other | Admitting: Internal Medicine

## 2013-04-26 ENCOUNTER — Other Ambulatory Visit (INDEPENDENT_AMBULATORY_CARE_PROVIDER_SITE_OTHER): Payer: Medicare Other

## 2013-04-26 VITALS — BP 142/72 | HR 92 | Temp 96.9°F | Wt 158.0 lb

## 2013-04-26 DIAGNOSIS — K5900 Constipation, unspecified: Secondary | ICD-10-CM | POA: Insufficient documentation

## 2013-04-26 DIAGNOSIS — R1011 Right upper quadrant pain: Secondary | ICD-10-CM | POA: Insufficient documentation

## 2013-04-26 DIAGNOSIS — J019 Acute sinusitis, unspecified: Secondary | ICD-10-CM | POA: Diagnosis not present

## 2013-04-26 DIAGNOSIS — IMO0001 Reserved for inherently not codable concepts without codable children: Secondary | ICD-10-CM

## 2013-04-26 DIAGNOSIS — I10 Essential (primary) hypertension: Secondary | ICD-10-CM | POA: Diagnosis not present

## 2013-04-26 DIAGNOSIS — E1165 Type 2 diabetes mellitus with hyperglycemia: Secondary | ICD-10-CM

## 2013-04-26 LAB — LIPID PANEL
Cholesterol: 193 mg/dL (ref 0–200)
HDL: 38.1 mg/dL — ABNORMAL LOW (ref >39.00)
LDL CALC: 125 mg/dL — AB (ref 0–99)
Total CHOL/HDL Ratio: 5
Triglycerides: 151 mg/dL — ABNORMAL HIGH (ref 0.0–149.0)
VLDL: 30.2 mg/dL (ref 0.0–40.0)

## 2013-04-26 LAB — URINALYSIS, ROUTINE W REFLEX MICROSCOPIC
Bilirubin Urine: NEGATIVE
Hgb urine dipstick: NEGATIVE
LEUKOCYTES UA: NEGATIVE
NITRITE: NEGATIVE
PH: 5.5 (ref 5.0–8.0)
RBC / HPF: NONE SEEN (ref 0–?)
SPECIFIC GRAVITY, URINE: 1.025 (ref 1.000–1.030)
Total Protein, Urine: NEGATIVE
Urine Glucose: NEGATIVE
Urobilinogen, UA: 0.2 (ref 0.0–1.0)

## 2013-04-26 LAB — BASIC METABOLIC PANEL
BUN: 17 mg/dL (ref 6–23)
CO2: 30 meq/L (ref 19–32)
Calcium: 9.5 mg/dL (ref 8.4–10.5)
Chloride: 100 mEq/L (ref 96–112)
Creatinine, Ser: 1 mg/dL (ref 0.4–1.5)
GFR: 74.47 mL/min (ref >60.00)
Glucose, Bld: 209 mg/dL — ABNORMAL HIGH (ref 70–99)
POTASSIUM: 4.6 meq/L (ref 3.5–5.1)
SODIUM: 138 meq/L (ref 135–145)

## 2013-04-26 LAB — CBC WITH DIFFERENTIAL/PLATELET
BASOS ABS: 0.1 10*3/uL (ref 0.0–0.1)
Basophils Relative: 1.1 % (ref 0.0–3.0)
Eosinophils Absolute: 0.1 10*3/uL (ref 0.0–0.7)
Eosinophils Relative: 0.8 % (ref 0.0–5.0)
HCT: 41.4 % (ref 39.0–52.0)
Hemoglobin: 13.7 g/dL (ref 13.0–17.0)
LYMPHS ABS: 1.6 10*3/uL (ref 0.7–4.0)
LYMPHS PCT: 14.7 % (ref 12.0–46.0)
MCHC: 33 g/dL (ref 30.0–36.0)
MCV: 85.6 fl (ref 78.0–100.0)
MONOS PCT: 8.3 % (ref 3.0–12.0)
Monocytes Absolute: 0.9 10*3/uL (ref 0.1–1.0)
NEUTROS PCT: 75.1 % (ref 43.0–77.0)
Neutro Abs: 8.2 10*3/uL — ABNORMAL HIGH (ref 1.4–7.7)
PLATELETS: 201 10*3/uL (ref 150.0–400.0)
RBC: 4.84 Mil/uL (ref 4.22–5.81)
RDW: 14.5 % (ref 11.5–14.6)
WBC: 10.9 10*3/uL — ABNORMAL HIGH (ref 4.5–10.5)

## 2013-04-26 LAB — HEPATIC FUNCTION PANEL
ALK PHOS: 53 U/L (ref 39–117)
ALT: 15 U/L (ref 0–53)
AST: 19 U/L (ref 0–37)
Albumin: 4.1 g/dL (ref 3.5–5.2)
Bilirubin, Direct: 0.1 mg/dL (ref 0.0–0.3)
TOTAL PROTEIN: 8 g/dL (ref 6.0–8.3)
Total Bilirubin: 0.8 mg/dL (ref 0.3–1.2)

## 2013-04-26 LAB — H. PYLORI ANTIBODY, IGG: H Pylori IgG: NEGATIVE

## 2013-04-26 LAB — LIPASE: LIPASE: 31 U/L (ref 11.0–59.0)

## 2013-04-26 LAB — HEMOGLOBIN A1C: HEMOGLOBIN A1C: 8 % — AB (ref 4.6–6.5)

## 2013-04-26 MED ORDER — LEVOFLOXACIN 250 MG PO TABS: 250.0000 mg | ORAL_TABLET | Freq: Every day | ORAL | Status: DC

## 2013-04-26 NOTE — Progress Notes (Signed)
Subjective:    Patient ID: Jeffrey Kaiser, male    DOB: April 23, 1925, 80 y.o.   MRN: [id number]  HPI   Here with 2-3 days acute onset fever, facial pain, pressure, headache, general weakness and malaise, and greenish d/c, with mild ST and cough, but pt denies chest pain, wheezing, increased sob or doe, orthopnea, PND, increased LE swelling, palpitations, dizziness or syncope.  Also with 1 wk intermittent right side /ruq pain, some radiation to the right shoulder, without n/v, fever, but has had some constipation as well. Past Medical History  Diagnosis Date  . Arthritis   . Diabetes mellitus   . Diverticulitis   . Glaucoma   . Hypertension   . Hyperlipidemia   . Hernia   . History of colonic diverticulitis 09/04/2010  . Anemia 09/04/2010  . DDD (degenerative disc disease), cervical 09/04/2010  . Diastolic dysfunction 9/51/8841  . Psoriasis 09/04/2010  . BPH (benign prostatic hyperplasia) 09/09/2010  . CAD (coronary artery disease)    Past Surgical History  Procedure Laterality Date  . Tonsillectomy  1952  . Broken legs  1981 and 1995  . S/p lipoma  2003    reports that he has never smoked. He does not have any smokeless tobacco history on file. He reports that he does not drink alcohol or use illicit drugs. family history includes Arthritis in his father and mother; Cancer in his other; Heart disease in his father and mother; Hypertension in his father and mother. No Known Allergies Current Outpatient Prescriptions on File Prior to Visit  Medication Sig Dispense Refill  . aspirin 81 MG tablet Take 81 mg by mouth daily.      . clobetasol (TEMOVATE) 0.05 % external solution as directed.        . cyclobenzaprine (FLEXERIL) 5 MG tablet Take 1 tablet (5 mg total) by mouth 3 (three) times daily as needed for muscle spasms.  60 tablet  2  . fosinopril (MONOPRIL) 20 MG tablet Take 1 tablet (20 mg total) by mouth daily.  90 tablet  3  . glipiZIDE (GLUCOTROL XL) 10 MG 24 hr tablet TAKE 1 TABLET  BY MOUTH EVERY DAY  90 tablet  3  . isosorbide mononitrate (IMDUR) 60 MG 24 hr tablet TAKE 1/2 TABLET DAILY  30 tablet  11  . metFORMIN (GLUCOPHAGE) 500 MG tablet TAKE 2 TABLETS IN THE MORNING,1 TABLET AT NOON AND 2 TABLETS IN THE EVENING  450 tablet  3  . metoprolol (LOPRESSOR) 50 MG tablet TAKE 1 TABLET BY MOUTH EVERY DAY  30 tablet  6  . pravastatin (PRAVACHOL) 80 MG tablet Take 80 mg by mouth at bedtime.        . sitaGLIPtin (JANUVIA) 100 MG tablet Take 1 tablet (100 mg total) by mouth daily.  90 tablet  3   No current facility-administered medications on file prior to visit.   Review of Systems  Constitutional: Negative for unexpected weight change, or unusual diaphoresis  HENT: Negative for tinnitus.   Eyes: Negative for photophobia and visual disturbance.  Respiratory: Negative for choking and stridor.   Gastrointestinal: Negative for vomiting and blood in stool.  Genitourinary: Negative for hematuria and decreased urine volume.  Musculoskeletal: Negative for acute joint swelling Skin: Negative for color change and wound.  Neurological: Negative for tremors and numbness other than noted  Psychiatric/Behavioral: Negative for decreased concentration or  hyperactivity.       Objective:   Physical Exam BP 142/72  Pulse 92  Temp(Src)  96.9 F (36.1 C) (Oral)  Wt 158 lb (71.668 kg)  SpO2 95% VS noted, mild ill Constitutional: Pt appears well-developed and well-nourished.  HENT: Head: NCAT.  Right Ear: External ear normal.  Left Ear: External ear normal.  Bilat tm's with mild erythema.  Max sinus areas mild tender.  Pharynx with mild erythema, no exudate Eyes: Conjunctivae and EOM are normal. Pupils are equal, round, and reactive to light.  Neck: Normal range of motion. Neck supple.  Cardiovascular: Normal rate and regular rhythm.   Pulmonary/Chest: Effort normal and breath sounds normal.  Abd:  Soft, non-distended, + BS but tender right costal margin/RUQ without guarding, no  flank tender No shoulder tender or chest wall tender Neurological: Pt is alert. Not confused  Skin: Skin is warm. No erythema.  Psychiatric: Pt behavior is normal. Thought content normal.     Assessment & Plan:

## 2013-04-26 NOTE — Patient Instructions (Signed)
Please take all new medication as prescribed- the antibiotic Please continue all other medications as before, and refills have been done if requested. Please have the pharmacy call with any other refills you may need.  You will be contacted regarding the referral for: abdomen ultrasound  - to see PCCs now for scheduling  Please go to the LAB in the Basement (turn left off the elevator) for the tests to be done today You will be contacted by phone if any changes need to be made immediately.  Otherwise, you will receive a letter about your results with an explanation, but please check with MyChart first.  Please remember to sign up for My Chart if you have not done so, as this will be important to you in the future with finding out test results, communicating by private email, and scheduling acute appointments online when needed.  Please return in 6 months, or sooner if needed, with Lab testing done 3-5 days before

## 2013-04-26 NOTE — Assessment & Plan Note (Addendum)
Cant r/o GB, for lab eval, and u/s , consider gen surg for abnormal tests  Note:  Total time for pt hx, exam, review of record with pt in the room, determination of diagnoses and plan for further eval and tx is > 40 min, with over 50% spent in coordination and counseling of patient

## 2013-04-26 NOTE — Progress Notes (Signed)
Pre-visit discussion using our clinic review tool. No additional management support is needed unless otherwise documented below in the visit note.  

## 2013-04-26 NOTE — Assessment & Plan Note (Signed)
Mild to mod, for antibx course,  to f/u any worsening symptoms or concerns 

## 2013-04-26 NOTE — Assessment & Plan Note (Signed)
Ok for miralax asd,  to f/u any worsening symptoms or concerns 

## 2013-04-26 NOTE — Assessment & Plan Note (Signed)
stable overall by history and exam, recent data reviewed with pt, and pt to continue medical treatment as before,  to f/u any worsening symptoms or concerns Lab Results  Component Value Date   HGBA1C 7.7* 12/03/2012

## 2013-04-26 NOTE — Assessment & Plan Note (Signed)
stable overall by history and exam, recent data reviewed with pt, and pt to continue medical treatment as before,  to f/u any worsening symptoms or concerns BP Readings from Last 3 Encounters:  04/26/13 142/72  12/03/12 110/80  05/13/12 160/80

## 2013-04-27 ENCOUNTER — Ambulatory Visit
Admission: RE | Admit: 2013-04-27 | Discharge: 2013-04-27 | Disposition: A | Discharge status: Home / Self Care | Payer: Medicare Other | Source: Ambulatory Visit | Attending: Internal Medicine | Admitting: Internal Medicine

## 2013-04-27 ENCOUNTER — Other Ambulatory Visit: Payer: Self-pay | Admitting: Internal Medicine

## 2013-04-27 DIAGNOSIS — R1011 Right upper quadrant pain: Secondary | ICD-10-CM | POA: Diagnosis not present

## 2013-04-27 DIAGNOSIS — E1165 Type 2 diabetes mellitus with hyperglycemia: Principal | ICD-10-CM

## 2013-04-27 DIAGNOSIS — IMO0001 Reserved for inherently not codable concepts without codable children: Secondary | ICD-10-CM

## 2013-04-28 ENCOUNTER — Telehealth: Payer: Self-pay | Admitting: Internal Medicine

## 2013-04-28 MED ORDER — ATORVASTATIN CALCIUM 20 MG PO TABS: 20.0000 mg | ORAL_TABLET | Freq: Every day | ORAL | Status: DC

## 2013-04-28 NOTE — Telephone Encounter (Signed)
Message copied by [name] on Thu Apr 28, 2013  1:05 PM ------      Message from: Sharon Seller B      Created: Wed Apr 27, 2013  4:41 PM       Called the patient informed of results and MD instructions.  The patient stated he does take pravastatin everyday. ------

## 2013-04-28 NOTE — Telephone Encounter (Signed)
Patient informed. 

## 2013-04-28 NOTE — Telephone Encounter (Signed)
unfortunatelty the pravastatin does not seem to be effective  , ok to change to lipitor 20 qd

## 2013-04-29 ENCOUNTER — Telehealth: Payer: Self-pay

## 2013-04-29 NOTE — Telephone Encounter (Signed)
Relevant patient education assigned to patient using Emmi. ° °

## 2013-05-03 ENCOUNTER — Telehealth: Payer: Self-pay | Admitting: Internal Medicine

## 2013-05-03 ENCOUNTER — Encounter: Payer: Self-pay | Admitting: Endocrinology

## 2013-05-03 ENCOUNTER — Ambulatory Visit (INDEPENDENT_AMBULATORY_CARE_PROVIDER_SITE_OTHER): Payer: Medicare Other | Admitting: Endocrinology

## 2013-05-03 VITALS — BP 108/60 | HR 92 | Temp 97.8°F | Ht 65.0 in | Wt 156.0 lb

## 2013-05-03 DIAGNOSIS — E1165 Type 2 diabetes mellitus with hyperglycemia: Principal | ICD-10-CM

## 2013-05-03 DIAGNOSIS — IMO0001 Reserved for inherently not codable concepts without codable children: Secondary | ICD-10-CM

## 2013-05-03 MED ORDER — GLUCOSE BLOOD VI STRP: 1.0000 | ORAL_STRIP | Freq: Every day | Status: DC

## 2013-05-03 MED ORDER — AZITHROMYCIN 250 MG PO TABS: ORAL_TABLET | ORAL | Status: DC

## 2013-05-03 MED ORDER — BROMOCRIPTINE MESYLATE 2.5 MG PO TABS: 1.2500 mg | ORAL_TABLET | Freq: Every day | ORAL | Status: DC

## 2013-05-03 MED ORDER — GLIPIZIDE ER 2.5 MG PO TB24: 2.5000 mg | ORAL_TABLET | Freq: Every day | ORAL | Status: DC

## 2013-05-03 NOTE — Progress Notes (Signed)
Subjective:    Patient ID: Jeffrey Kaiser, male    DOB: 03-03-1926, 80 y.o.   MRN: [id number]  HPI pt states DM was dx'ed in 1995; he has mild if any neuropathy of the lower extremities, but he has associated CAD.  he has never been on insulin.  pt says his diet is "ok," but exercise is limited by arthralgias. He does not check cbg's.   Past Medical History  Diagnosis Date  . Arthritis   . Diabetes mellitus   . Diverticulitis   . Glaucoma   . Hypertension   . Hyperlipidemia   . Hernia   . History of colonic diverticulitis 09/04/2010  . Anemia 09/04/2010  . DDD (degenerative disc disease), cervical 09/04/2010  . Diastolic dysfunction 8/67/6720  . Psoriasis 09/04/2010  . BPH (benign prostatic hyperplasia) 09/09/2010  . CAD (coronary artery disease)     Past Surgical History  Procedure Laterality Date  . Tonsillectomy  1952  . Broken legs  1981 and 1995  . S/p lipoma  2003    History   Social History  . Marital Status: Single    Spouse Name: N/A    Number of Children: N/A  . Years of Education: N/A   Occupational History  . retired    Social History Main Topics  . Smoking status: Never Smoker   . Smokeless tobacco: Not on file  . Alcohol Use: No  . Drug Use: No  . Sexual Activity: Not on file   Other Topics Concern  . Not on file   Social History Narrative  . No narrative on file    Current Outpatient Prescriptions on File Prior to Visit  Medication Sig Dispense Refill  . aspirin 81 MG tablet Take 81 mg by mouth daily.      Marland Kitchen atorvastatin (LIPITOR) 20 MG tablet Take 1 tablet (20 mg total) by mouth daily.  90 tablet  3  . clobetasol (TEMOVATE) 0.05 % external solution as directed.        . cyclobenzaprine (FLEXERIL) 5 MG tablet Take 1 tablet (5 mg total) by mouth 3 (three) times daily as needed for muscle spasms.  60 tablet  2  . fosinopril (MONOPRIL) 20 MG tablet Take 1 tablet (20 mg total) by mouth daily.  90 tablet  3  . glipiZIDE (GLUCOTROL XL) 10 MG 24 hr  tablet TAKE 1 TABLET BY MOUTH EVERY DAY  90 tablet  3  . isosorbide mononitrate (IMDUR) 60 MG 24 hr tablet TAKE 1/2 TABLET DAILY  30 tablet  11  . levofloxacin (LEVAQUIN) 250 MG tablet Take 1 tablet (250 mg total) by mouth daily.  10 tablet  0  . metFORMIN (GLUCOPHAGE) 500 MG tablet TAKE 2 TABLETS IN THE MORNING,1 TABLET AT NOON AND 2 TABLETS IN THE EVENING  450 tablet  3  . metoprolol (LOPRESSOR) 50 MG tablet TAKE 1 TABLET BY MOUTH EVERY DAY  30 tablet  6  . sitaGLIPtin (JANUVIA) 100 MG tablet Take 1 tablet (100 mg total) by mouth daily.  90 tablet  3   No current facility-administered medications on file prior to visit.    No Known Allergies  Family History  Problem Relation Age of Onset  . Arthritis Mother   . Heart disease Mother   . Hypertension Mother   . Arthritis Father   . Heart disease Father   . Hypertension Father   . Cancer Other     lung cancer  DM: none  BP 108/60  Pulse 92  Temp(Src) 97.8 F (36.6 C) (Oral)  Ht 5\' 5"  (1.651 m)  Wt 156 lb (70.761 kg)  BMI 25.96 kg/m2  SpO2 93%  Review of Systems  Constitutional: Negative for fever.  HENT: Negative for hearing loss.   Neurological: Negative for numbness.  denies weight loss, blurry vision, headache, chest pain, sob, n/v, excessive diaphoresis, memory loss, depression, cold intolerance, rhinorrhea, and easy bruising.  He has urinary frequency and leg cramps.      Objective:   Physical Exam VS: see vs page GEN: no distress HEAD: head: no deformity eyes: no periorbital swelling, no proptosis external nose and ears are normal mouth: no lesion seen NECK: supple, thyroid is not enlarged CHEST WALL: no deformity LUNGS: clear to auscultation BREASTS:  No gynecomastia CV: reg rate and rhythm, no murmur ABD: abdomen is soft, nontender.  no hepatosplenomegaly.  not distended.  no hernia GENITALIA:  Normal male.   RECTAL: normal external and internal exam.  heme neg. PROSTATE:  Normal size.  No  nodule MUSCULOSKELETAL: muscle bulk and strength are grossly normal.  no obvious joint swelling.  gait is normal and steady PULSES: no carotid bruit NEURO:  cn 2-12 grossly intact.   readily moves all 4's.  SKIN:  Normal texture and temperature.  No rash or suspicious lesion is visible.   NODES:  None palpable at the neck PSYCH: alert, well-oriented.  Does not appear anxious nor depressed.  Lab Results  Component Value Date   HGBA1C 8.0* 04/26/2013      Assessment & Plan:  DM: he needs increased rx CAD: in this setting, he should avoid hypoglycemia.  This means minimizing or stopping glipizide. OA: this limits exercise rx of DM. Psoriasis: this raises the possibility that pt is evolving type 1 DM.

## 2013-05-03 NOTE — Patient Instructions (Addendum)
good diet and exercise habits significanly improve the control of your diabetes.  please let me know if you wish to be referred to a dietician.  high blood sugar is very risky to your health.  you should see an eye doctor every year.  You are at higher than average risk for pneumonia and hepatitis-B.  You should be vaccinated against both.   controlling your blood pressure and cholesterol drastically reduces the damage diabetes does to your body.  this also applies to quitting smoking.  please discuss these with your doctor.  check your blood sugar once a day.  vary the time of day when you check, between before the 3 meals, and at bedtime.  also check if you have symptoms of your blood sugar being too high or too low.  please keep a record of the readings and bring it to your next appointment here.  You can write it on any piece of paper.  please call us sooner if your blood sugar goes below 70, or if you have a lot of readings over 200. Please reduce the glipizide.  i have sent a prescription to your pharmacy. Please also add "bromocriptine," to help your blood sugar. It has possible side effects of nausea and dizziness.  These go away with time.  You can avoid these by taking it at bedtime.   Please come back for a follow-up appointment in 2 weeks.

## 2013-05-03 NOTE — Telephone Encounter (Signed)
Ok to change to zpack - done per [employer]

## 2013-05-03 NOTE — Telephone Encounter (Signed)
Patient called and states that the levofloxacin (LEVAQUIN) 250 MG tablet rx causes his stomach to be upset. He wants to be advised on what he should do.

## 2013-05-04 NOTE — Telephone Encounter (Signed)
Patient informed. 

## 2013-05-17 ENCOUNTER — Ambulatory Visit (INDEPENDENT_AMBULATORY_CARE_PROVIDER_SITE_OTHER): Payer: Medicare Other | Admitting: Endocrinology

## 2013-05-17 ENCOUNTER — Encounter: Payer: Self-pay | Admitting: Endocrinology

## 2013-05-17 ENCOUNTER — Ambulatory Visit: Payer: Medicare Other | Admitting: Endocrinology

## 2013-05-17 VITALS — BP 122/80 | HR 72 | Temp 97.4°F | Ht 65.0 in | Wt 159.0 lb

## 2013-05-17 DIAGNOSIS — E1159 Type 2 diabetes mellitus with other circulatory complications: Secondary | ICD-10-CM | POA: Diagnosis not present

## 2013-05-17 DIAGNOSIS — B351 Tinea unguium: Secondary | ICD-10-CM | POA: Diagnosis not present

## 2013-05-17 DIAGNOSIS — IMO0001 Reserved for inherently not codable concepts without codable children: Secondary | ICD-10-CM

## 2013-05-17 DIAGNOSIS — B353 Tinea pedis: Secondary | ICD-10-CM | POA: Diagnosis not present

## 2013-05-17 DIAGNOSIS — M79609 Pain in unspecified limb: Secondary | ICD-10-CM | POA: Diagnosis not present

## 2013-05-17 DIAGNOSIS — L608 Other nail disorders: Secondary | ICD-10-CM | POA: Diagnosis not present

## 2013-05-17 DIAGNOSIS — I739 Peripheral vascular disease, unspecified: Secondary | ICD-10-CM | POA: Diagnosis not present

## 2013-05-17 DIAGNOSIS — E1165 Type 2 diabetes mellitus with hyperglycemia: Principal | ICD-10-CM

## 2013-05-17 MED ORDER — NATEGLINIDE 60 MG PO TABS: 60.0000 mg | ORAL_TABLET | Freq: Three times a day (TID) | ORAL | Status: DC

## 2013-05-17 NOTE — Patient Instructions (Addendum)
check your blood sugar once a day.  vary the time of day when you check, between before the 3 meals, and at bedtime.  also check if you have symptoms of your blood sugar being too high or too low.  please keep a record of the readings and bring it to your next appointment here.  You can write it on any piece of paper.  please call us sooner if your blood sugar goes below 70, or if you have a lot of readings over 200. Please change the glipizide to "repaglinide."  i have sent a prescription to your pharmacy.  This is a low dosage, which we can double if necessary.   Please come back for a follow-up appointment in 2 months.

## 2013-05-17 NOTE — Progress Notes (Signed)
Subjective:    Patient ID: Jeffrey Kaiser, male    DOB: 1926/03/16, 80 y.o.   MRN: [id number]  HPI pt returns for f/u of type 2 DM (dx'ed 1995; he has mild if any neuropathy of the lower extremities, but he has associated CAD; he has never been on insulin).  he brings a record of his cbg's which i have reviewed today.  It varies from 73-197, but most are in the low-100's.   Past Medical History  Diagnosis Date  . Arthritis   . Diabetes mellitus   . Diverticulitis   . Glaucoma   . Hypertension   . Hyperlipidemia   . Hernia   . History of colonic diverticulitis 09/04/2010  . Anemia 09/04/2010  . DDD (degenerative disc disease), cervical 09/04/2010  . Diastolic dysfunction 12/04/5619  . Psoriasis 09/04/2010  . BPH (benign prostatic hyperplasia) 09/09/2010  . CAD (coronary artery disease)     Past Surgical History  Procedure Laterality Date  . Tonsillectomy  1952  . Broken legs  1981 and 1995  . S/p lipoma  2003    History   Social History  . Marital Status: Single    Spouse Name: N/A    Number of Children: N/A  . Years of Education: N/A   Occupational History  . retired    Social History Main Topics  . Smoking status: Never Smoker   . Smokeless tobacco: Not on file  . Alcohol Use: No  . Drug Use: No  . Sexual Activity: Not on file   Other Topics Concern  . Not on file   Social History Narrative  . No narrative on file    Current Outpatient Prescriptions on File Prior to Visit  Medication Sig Dispense Refill  . aspirin 81 MG tablet Take 81 mg by mouth daily.      Marland Kitchen atorvastatin (LIPITOR) 20 MG tablet Take 1 tablet (20 mg total) by mouth daily.  90 tablet  3  . bromocriptine (PARLODEL) 2.5 MG tablet Take 0.5 tablets (1.25 mg total) by mouth at bedtime.  15 tablet  11  . clobetasol (TEMOVATE) 0.05 % external solution as directed.        . cyclobenzaprine (FLEXERIL) 5 MG tablet Take 1 tablet (5 mg total) by mouth 3 (three) times daily as needed for muscle spasms.  60  tablet  2  . fosinopril (MONOPRIL) 20 MG tablet Take 1 tablet (20 mg total) by mouth daily.  90 tablet  3  . glucose blood (ONETOUCH VERIO) test strip 1 each by Other route daily. And lancets 1/day 250.00  100 each  12  . isosorbide mononitrate (IMDUR) 60 MG 24 hr tablet TAKE 1/2 TABLET DAILY  30 tablet  11  . metFORMIN (GLUCOPHAGE) 500 MG tablet TAKE 2 TABLETS IN THE MORNING,1 TABLET AT NOON AND 2 TABLETS IN THE EVENING  450 tablet  3  . metoprolol (LOPRESSOR) 50 MG tablet TAKE 1 TABLET BY MOUTH EVERY DAY  30 tablet  6  . sitaGLIPtin (JANUVIA) 100 MG tablet Take 1 tablet (100 mg total) by mouth daily.  90 tablet  3   No current facility-administered medications on file prior to visit.    Allergies  Allergen Reactions  . Levaquin [Levofloxacin In D5w] Nausea Only    Family History  Problem Relation Age of Onset  . Arthritis Mother   . Heart disease Mother   . Hypertension Mother   . Arthritis Father   . Heart disease Father   .  Hypertension Father   . Cancer Other     lung cancer   BP 122/80  Pulse 72  Temp(Src) 97.4 F (36.3 C) (Oral)  Ht 5\' 5"  (1.651 m)  Wt 159 lb (72.122 kg)  BMI 26.46 kg/m2  SpO2 94%  Review of Systems denies hypoglycemia and weight change.      Objective:   Physical Exam VITAL SIGNS:  See vs page GENERAL: no distress Ext: no edema     Assessment & Plan:  DM: with variable cbg's. CAD: in this setting, he should avoid hypoglycemia.  This means finding an alternative to glipizide. OA: this limits exercise rx of DM.

## 2013-05-20 ENCOUNTER — Other Ambulatory Visit: Payer: Self-pay | Admitting: Internal Medicine

## 2013-06-07 ENCOUNTER — Ambulatory Visit: Payer: Medicare Other | Admitting: Internal Medicine

## 2013-06-14 DIAGNOSIS — B351 Tinea unguium: Secondary | ICD-10-CM | POA: Diagnosis not present

## 2013-06-14 DIAGNOSIS — M79609 Pain in unspecified limb: Secondary | ICD-10-CM | POA: Diagnosis not present

## 2013-06-14 DIAGNOSIS — H4011X0 Primary open-angle glaucoma, stage unspecified: Secondary | ICD-10-CM | POA: Diagnosis not present

## 2013-06-14 DIAGNOSIS — E11329 Type 2 diabetes mellitus with mild nonproliferative diabetic retinopathy without macular edema: Secondary | ICD-10-CM | POA: Diagnosis not present

## 2013-06-14 LAB — HM DIABETES EYE EXAM

## 2013-06-17 ENCOUNTER — Encounter: Payer: Self-pay | Admitting: Internal Medicine

## 2013-07-18 ENCOUNTER — Ambulatory Visit: Payer: Medicare Other | Admitting: Endocrinology

## 2013-07-19 ENCOUNTER — Encounter: Payer: Self-pay | Admitting: Endocrinology

## 2013-07-19 ENCOUNTER — Ambulatory Visit (INDEPENDENT_AMBULATORY_CARE_PROVIDER_SITE_OTHER): Payer: Medicare Other | Admitting: Endocrinology

## 2013-07-19 VITALS — BP 120/80 | HR 72 | Temp 98.0°F | Ht 65.0 in | Wt 154.0 lb

## 2013-07-19 DIAGNOSIS — IMO0001 Reserved for inherently not codable concepts without codable children: Secondary | ICD-10-CM

## 2013-07-19 DIAGNOSIS — M79609 Pain in unspecified limb: Secondary | ICD-10-CM | POA: Diagnosis not present

## 2013-07-19 DIAGNOSIS — E1165 Type 2 diabetes mellitus with hyperglycemia: Principal | ICD-10-CM

## 2013-07-19 DIAGNOSIS — B351 Tinea unguium: Secondary | ICD-10-CM | POA: Diagnosis not present

## 2013-07-19 LAB — MICROALBUMIN / CREATININE URINE RATIO
Creatinine,U: 167.4 mg/dL
MICROALB/CREAT RATIO: 1.5 mg/g (ref 0.0–30.0)
Microalb, Ur: 2.5 mg/dL — ABNORMAL HIGH (ref 0.0–1.9)

## 2013-07-19 LAB — HEMOGLOBIN A1C: HEMOGLOBIN A1C: 7.5 % — AB (ref 4.6–6.5)

## 2013-07-19 MED ORDER — REPAGLINIDE 1 MG PO TABS: 1.0000 mg | ORAL_TABLET | Freq: Three times a day (TID) | ORAL | Status: DC

## 2013-07-19 NOTE — Patient Instructions (Addendum)
check your blood sugar once a day.  vary the time of day when you check, between before the 3 meals, and at bedtime.  also check if you have symptoms of your blood sugar being too high or too low.  please keep a record of the readings and bring it to your next appointment here.  You can write it on any piece of paper.  please call us sooner if your blood sugar goes below 70, or if you have a lot of readings over 200.  blood tests are being requested for you today.  We'll contact you with results.   If it is high, we'll add "invokana." Please come back for a follow-up appointment in 3 months.    

## 2013-07-19 NOTE — Progress Notes (Signed)
Subjective:    Patient ID: Jeffrey Kaiser, male    DOB: 10/25/1925, 80 y.o.   MRN: [id number]  HPI pt returns for f/u of type 2 DM (dx'ed 1995; he has mild if any neuropathy of the lower extremities, but he has associated CAD; he has never been on insulin; he takes 4 oral meds).  he brings a record of his cbg's which i have reviewed today.  It varies from 73-197, but most are in the low-100's.  pt states he feels well in general.  Past Medical History  Diagnosis Date  . Arthritis   . Diabetes mellitus   . Diverticulitis   . Glaucoma   . Hypertension   . Hyperlipidemia   . Hernia   . History of colonic diverticulitis 09/04/2010  . Anemia 09/04/2010  . DDD (degenerative disc disease), cervical 09/04/2010  . Diastolic dysfunction 7/56/4332  . Psoriasis 09/04/2010  . BPH (benign prostatic hyperplasia) 09/09/2010  . CAD (coronary artery disease)     Past Surgical History  Procedure Laterality Date  . Tonsillectomy  1952  . Broken legs  1981 and 1995  . S/p lipoma  2003    History   Social History  . Marital Status: Single    Spouse Name: N/A    Number of Children: N/A  . Years of Education: N/A   Occupational History  . retired    Social History Main Topics  . Smoking status: Never Smoker   . Smokeless tobacco: Not on file  . Alcohol Use: No  . Drug Use: No  . Sexual Activity: Not on file   Other Topics Concern  . Not on file   Social History Narrative  . No narrative on file    Current Outpatient Prescriptions on File Prior to Visit  Medication Sig Dispense Refill  . aspirin 81 MG tablet Take 81 mg by mouth daily.      Marland Kitchen atorvastatin (LIPITOR) 20 MG tablet Take 1 tablet (20 mg total) by mouth daily.  90 tablet  3  . bromocriptine (PARLODEL) 2.5 MG tablet Take 0.5 tablets (1.25 mg total) by mouth at bedtime.  15 tablet  11  . clobetasol (TEMOVATE) 0.05 % external solution as directed.        . cyclobenzaprine (FLEXERIL) 5 MG tablet Take 1 tablet (5 mg total) by  mouth 3 (three) times daily as needed for muscle spasms.  60 tablet  2  . fosinopril (MONOPRIL) 20 MG tablet TAKE 1 TABLET (20 MG TOTAL) BY MOUTH DAILY.  90 tablet  3  . glucose blood (ONETOUCH VERIO) test strip 1 each by Other route daily. And lancets 1/day 250.00  100 each  12  . isosorbide mononitrate (IMDUR) 60 MG 24 hr tablet TAKE 1/2 TABLET DAILY  30 tablet  11  . metFORMIN (GLUCOPHAGE) 500 MG tablet TAKE 2 TABLETS IN THE MORNING,1 TABLET AT NOON AND 2 TABLETS IN THE EVENING  450 tablet  3  . metoprolol (LOPRESSOR) 50 MG tablet TAKE 1 TABLET BY MOUTH EVERY DAY  30 tablet  6  . sitaGLIPtin (JANUVIA) 100 MG tablet Take 1 tablet (100 mg total) by mouth daily.  90 tablet  3   No current facility-administered medications on file prior to visit.    Allergies  Allergen Reactions  . Levaquin [Levofloxacin In D5w] Nausea Only    Family History  Problem Relation Age of Onset  . Arthritis Mother   . Heart disease Mother   . Hypertension Mother   .  Arthritis Father   . Heart disease Father   . Hypertension Father   . Cancer Other     lung cancer    BP 120/80  Pulse 72  Temp(Src) 98 F (36.7 C) (Oral)  Ht 5\' 5"  (1.651 m)  Wt 154 lb (69.854 kg)  BMI 25.63 kg/m2  SpO2 98%  Review of Systems He denies hypoglycemia and weight change    Objective:   Physical Exam VITAL SIGNS:  See vs page GENERAL: no distress.  Lab Results  Component Value Date   HGBA1C 7.5* 07/19/2013      Assessment & Plan:  DM: he needs increased rx, if it can be done with a regimen that avoids or minimizes hypoglycemia. CAD: in this setting, he should avoid hypoglycemia.

## 2013-07-30 ENCOUNTER — Other Ambulatory Visit: Payer: Self-pay | Admitting: Internal Medicine

## 2013-09-20 DIAGNOSIS — E11329 Type 2 diabetes mellitus with mild nonproliferative diabetic retinopathy without macular edema: Secondary | ICD-10-CM | POA: Diagnosis not present

## 2013-09-20 DIAGNOSIS — Z961 Presence of intraocular lens: Secondary | ICD-10-CM | POA: Diagnosis not present

## 2013-09-20 DIAGNOSIS — H4011X0 Primary open-angle glaucoma, stage unspecified: Secondary | ICD-10-CM | POA: Diagnosis not present

## 2013-10-18 ENCOUNTER — Encounter: Payer: Self-pay | Admitting: Endocrinology

## 2013-10-18 ENCOUNTER — Ambulatory Visit (INDEPENDENT_AMBULATORY_CARE_PROVIDER_SITE_OTHER): Payer: Medicare Other | Admitting: Endocrinology

## 2013-10-18 VITALS — BP 136/72 | HR 64 | Temp 98.3°F | Ht 65.0 in | Wt 157.0 lb

## 2013-10-18 DIAGNOSIS — E1165 Type 2 diabetes mellitus with hyperglycemia: Principal | ICD-10-CM

## 2013-10-18 DIAGNOSIS — IMO0001 Reserved for inherently not codable concepts without codable children: Secondary | ICD-10-CM | POA: Diagnosis not present

## 2013-10-18 LAB — HEMOGLOBIN A1C: Hgb A1c MFr Bld: 7.4 % — ABNORMAL HIGH (ref 4.6–6.5)

## 2013-10-18 NOTE — Patient Instructions (Signed)
check your blood sugar once a day.  vary the time of day when you check, between before the 3 meals, and at bedtime.  also check if you have symptoms of your blood sugar being too high or too low.  please keep a record of the readings and bring it to your next appointment here.  You can write it on any piece of paper.  please call us sooner if your blood sugar goes below 70, or if you have a lot of readings over 200.  blood tests are being requested for you today.  We'll contact you with results.   If it is high, we'll add "invokana." Please come back for a follow-up appointment in 3 months.

## 2013-10-18 NOTE — Progress Notes (Signed)
Subjective:    Patient ID: Jeffrey Kaiser, male    DOB: Jan 01, 1926, 80 y.o.   MRN: [id number]  HPI pt returns for f/u of type 2 DM (dx'ed 1995; he has mild if any neuropathy of the lower extremities, but he has associated CAD; he has never been on insulin; he takes 4 oral meds).  no cbg record, but states cbg's are almost all in the low-100's.  pt states she feels well in general. Past Medical History  Diagnosis Date  . Arthritis   . Diabetes mellitus   . Diverticulitis   . Glaucoma   . Hypertension   . Hyperlipidemia   . Hernia   . History of colonic diverticulitis 09/04/2010  . Anemia 09/04/2010  . DDD (degenerative disc disease), cervical 09/04/2010  . Diastolic dysfunction 6/44/0347  . Psoriasis 09/04/2010  . BPH (benign prostatic hyperplasia) 09/09/2010  . CAD (coronary artery disease)     Past Surgical History  Procedure Laterality Date  . Tonsillectomy  1952  . Broken legs  1981 and 1995  . S/p lipoma  2003    History   Social History  . Marital Status: Single    Spouse Name: N/A    Number of Children: N/A  . Years of Education: N/A   Occupational History  . retired    Social History Main Topics  . Smoking status: Never Smoker   . Smokeless tobacco: Not on file  . Alcohol Use: No  . Drug Use: No  . Sexual Activity: Not on file   Other Topics Concern  . Not on file   Social History Narrative  . No narrative on file    Current Outpatient Prescriptions on File Prior to Visit  Medication Sig Dispense Refill  . aspirin 81 MG tablet Take 81 mg by mouth daily.      Marland Kitchen atorvastatin (LIPITOR) 20 MG tablet Take 1 tablet (20 mg total) by mouth daily.  90 tablet  3  . bromocriptine (PARLODEL) 2.5 MG tablet Take 0.5 tablets (1.25 mg total) by mouth at bedtime.  15 tablet  11  . clobetasol (TEMOVATE) 0.05 % external solution as directed.        . cyclobenzaprine (FLEXERIL) 5 MG tablet Take 1 tablet (5 mg total) by mouth 3 (three) times daily as needed for muscle  spasms.  60 tablet  2  . fosinopril (MONOPRIL) 20 MG tablet TAKE 1 TABLET (20 MG TOTAL) BY MOUTH DAILY.  90 tablet  3  . glucose blood (ONETOUCH VERIO) test strip 1 each by Other route daily. And lancets 1/day 250.00  100 each  12  . isosorbide mononitrate (IMDUR) 60 MG 24 hr tablet TAKE 1/2 TABLET DAILY  30 tablet  11  . JANUVIA 100 MG tablet TAKE 1 TABLET (100 MG TOTAL) BY MOUTH DAILY.  90 tablet  1  . metFORMIN (GLUCOPHAGE) 500 MG tablet TAKE 2 TABLETS IN THE MORNING,1 TABLET AT NOON AND 2 TABLETS IN THE EVENING  450 tablet  3  . metoprolol (LOPRESSOR) 50 MG tablet TAKE 1 TABLET BY MOUTH EVERY DAY  30 tablet  6  . repaglinide (PRANDIN) 1 MG tablet Take 1 tablet (1 mg total) by mouth 3 (three) times daily before meals.  90 tablet  11   No current facility-administered medications on file prior to visit.    Allergies  Allergen Reactions  . Levaquin [Levofloxacin In D5w] Nausea Only    Family History  Problem Relation Age of Onset  .  Arthritis Mother   . Heart disease Mother   . Hypertension Mother   . Arthritis Father   . Heart disease Father   . Hypertension Father   . Cancer Other     lung cancer    BP 136/72  Pulse 64  Temp(Src) 98.3 F (36.8 C) (Oral)  Ht 5\' 5"  (1.651 m)  Wt 157 lb (71.215 kg)  BMI 26.13 kg/m2  SpO2 90%    Review of Systems He denies hypoglycemia and weight change.    Objective:   Physical Exam VITAL SIGNS:  See vs page GENERAL: no distress Pulses: dorsalis pedis intact bilat.  Feet: no deformity. normal color and temp. no edema. Long toenails. There is bilateral onychomycosis.  Skin: no ulcer on the feet.  Neuro: sensation is intact to touch on the feet.     Lab Results  Component Value Date   HGBA1C 7.4* 10/18/2013   Lab Results  Component Value Date   CREATININE 1.0 04/26/2013   BUN 17 04/26/2013   NA 138 04/26/2013   K 4.6 04/26/2013   CL 100 04/26/2013   CO2 30 04/26/2013      Assessment & Plan:  DM: well-controlled, for this  clinical situation Frail elderly state: in this setting, he should not have an a1c goal of <7% CAD: in this setting, he should avoid hypoglycemia.  I'll work around this as best I can   Patient is advised the following: Patient Instructions  check your blood sugar once a day.  vary the time of day when you check, between before the 3 meals, and at bedtime.  also check if you have symptoms of your blood sugar being too high or too low.  please keep a record of the readings and bring it to your next appointment here.  You can write it on any piece of paper.  please call us sooner if your blood sugar goes below 70, or if you have a lot of readings over 200.  blood tests are being requested for you today.  We'll contact you with results.   If it is high, we'll add "invokana." Please come back for a follow-up appointment in 3 months.

## 2013-10-26 ENCOUNTER — Ambulatory Visit (INDEPENDENT_AMBULATORY_CARE_PROVIDER_SITE_OTHER): Payer: Medicare Other | Admitting: Internal Medicine

## 2013-10-26 ENCOUNTER — Encounter: Payer: Self-pay | Admitting: Internal Medicine

## 2013-10-26 VITALS — BP 174/80 | HR 56 | Temp 98.2°F | Ht 65.0 in | Wt 158.1 lb

## 2013-10-26 DIAGNOSIS — I10 Essential (primary) hypertension: Secondary | ICD-10-CM | POA: Diagnosis not present

## 2013-10-26 DIAGNOSIS — M542 Cervicalgia: Secondary | ICD-10-CM

## 2013-10-26 DIAGNOSIS — E1165 Type 2 diabetes mellitus with hyperglycemia: Secondary | ICD-10-CM

## 2013-10-26 DIAGNOSIS — IMO0001 Reserved for inherently not codable concepts without codable children: Secondary | ICD-10-CM

## 2013-10-26 MED ORDER — PREDNISONE 10 MG PO TABS: 10.0000 mg | ORAL_TABLET | Freq: Every day | ORAL | Status: DC

## 2013-10-26 MED ORDER — TRAMADOL HCL 50 MG PO TABS: 50.0000 mg | ORAL_TABLET | Freq: Four times a day (QID) | ORAL | Status: DC | PRN

## 2013-10-26 MED ORDER — TIZANIDINE HCL 4 MG PO TABS: 4.0000 mg | ORAL_TABLET | Freq: Four times a day (QID) | ORAL | Status: DC | PRN

## 2013-10-26 NOTE — Assessment & Plan Note (Signed)
elev today liekily situationl, o/w stable overall by history and exam, recent data reviewed with pt, and pt to continue medical treatment as before,  to f/u any worsening symptoms or concerns BP Readings from Last 3 Encounters:  10/26/13 174/80  10/18/13 136/72  07/19/13 120/80

## 2013-10-26 NOTE — Progress Notes (Signed)
Pre visit review using our clinic review tool, if applicable. No additional management support is needed unless otherwise documented below in the visit note. 

## 2013-10-26 NOTE — Progress Notes (Signed)
Subjective:    Patient ID: Jeffrey Kaiser, male    DOB: [birth year], 80 y.o.   MRN: [id number]  HPI  Here with 3-4 days worsening acute on chronic bilat but right > left neck pain with some radiation to the right upper back, not clear if numbness or weakness but right hand "just doesn't feel right" but can write, grasp and hold objects.  No recent trauma or falls,  But also feels fatigue to both legs, left more than right, no lower back pain or radicular pain.  Has known lower cervical spine deg changes and "fusion" per pt per last sport med eval several yrs ago.  Daughter with him today seems to feel he has some swelling to the lower neck and right side as well.  Has what sounds liek a sebaceus cyst to right post shoulder but no sholder/elbow/wrist pain/swelling.  Works in garden daily, no heavy lifting or off balance or falls.  No fever, cough, ST. Pt denies chest pain, increased sob or doe, wheezing, orthopnea, PND, increased LE swelling, palpitations, dizziness or syncope.  Saw endo dr sesssion for DM - doing ok , last a1c 7.4. Last CT head and cervical spine 2011 - neg for acute but _+ deg changes Past Medical History  Diagnosis Date  . Arthritis   . Diabetes mellitus   . Diverticulitis   . Glaucoma   . Hypertension   . Hyperlipidemia   . Hernia   . History of colonic diverticulitis 09/04/2010  . Anemia 09/04/2010  . DDD (degenerative disc disease), cervical 09/04/2010  . Diastolic dysfunction 3/79/0240  . Psoriasis 09/04/2010  . BPH (benign prostatic hyperplasia) 09/09/2010  . CAD (coronary artery disease)    Past Surgical History  Procedure Laterality Date  . Tonsillectomy  1952  . Broken legs  1981 and 1995  . S/p lipoma  2003    reports that he has never smoked. He does not have any smokeless tobacco history on file. He reports that he does not drink alcohol or use illicit drugs. family history includes Arthritis in his father and mother; Cancer in his other; Heart disease in his  father and mother; Hypertension in his father and mother. Allergies  Allergen Reactions  . Levaquin [Levofloxacin In D5w] Nausea Only   Current Outpatient Prescriptions on File Prior to Visit  Medication Sig Dispense Refill  . aspirin 81 MG tablet Take 81 mg by mouth daily.      Marland Kitchen atorvastatin (LIPITOR) 20 MG tablet Take 1 tablet (20 mg total) by mouth daily.  90 tablet  3  . bromocriptine (PARLODEL) 2.5 MG tablet Take 0.5 tablets (1.25 mg total) by mouth at bedtime.  15 tablet  11  . clobetasol (TEMOVATE) 0.05 % external solution as directed.        . cyclobenzaprine (FLEXERIL) 5 MG tablet Take 1 tablet (5 mg total) by mouth 3 (three) times daily as needed for muscle spasms.  60 tablet  2  . fosinopril (MONOPRIL) 20 MG tablet TAKE 1 TABLET (20 MG TOTAL) BY MOUTH DAILY.  90 tablet  3  . glucose blood (ONETOUCH VERIO) test strip 1 each by Other route daily. And lancets 1/day 250.00  100 each  12  . isosorbide mononitrate (IMDUR) 60 MG 24 hr tablet TAKE 1/2 TABLET DAILY  30 tablet  11  . JANUVIA 100 MG tablet TAKE 1 TABLET (100 MG TOTAL) BY MOUTH DAILY.  90 tablet  1  . metFORMIN (GLUCOPHAGE) 500 MG tablet TAKE  2 TABLETS IN THE MORNING,1 TABLET AT NOON AND 2 TABLETS IN THE EVENING  450 tablet  3  . metoprolol (LOPRESSOR) 50 MG tablet TAKE 1 TABLET BY MOUTH EVERY DAY  30 tablet  6  . repaglinide (PRANDIN) 1 MG tablet Take 1 tablet (1 mg total) by mouth 3 (three) times daily before meals.  90 tablet  11   No current facility-administered medications on file prior to visit.    Review of Systems  Constitutional: Negative for unusual diaphoresis or other sweats  HENT: Negative for ringing in ear Eyes: Negative for double vision or worsening visual disturbance.  Respiratory: Negative for choking and stridor.   Gastrointestinal: Negative for vomiting or other signifcant bowel change Genitourinary: Negative for hematuria or decreased urine volume.  Musculoskeletal: Negative for other MSK pain or  swelling Skin: Negative for color change and worsening wound.  Neurological: Negative for tremors and numbness other than noted  Psychiatric/Behavioral: Negative for decreased concentration or agitation other than above       Objective:   Physical Exam BP 174/80  Pulse 56  Temp(Src) 98.2 F (36.8 C) (Oral)  Ht 5\' 5"  (1.651 m)  Wt 158 lb 1.9 oz (71.723 kg)  BMI 26.31 kg/m2  SpO2 95% VS noted,  Constitutional: Pt appears well-developed, well-nourished.  HENT: Head: NCAT.  Right Ear: External ear normal.  Left Ear: External ear normal.  Eyes: . Pupils are equal, round, and reactive to light. Conjunctivae and EOM are normal Neck: Normal range of motion. Neck supple.  Cardiovascular: Normal rate and regular rhythm.   Pulmonary/Chest: Effort normal and breath sounds normal.  Neurological: Pt is alert. Not confused , motor  Intact 5/5, sens intact, dtr symmetric and full, gait slow and stiff with reduced right arm swing c-spine with tender mid and upper midline but no swelling, redness; very hard fusion area noted lower c-spine midline, has marked muscular firmness right > left paracervical noted as well with neck in exag lordosis Skin: Skin is warm. No rash Psychiatric: Pt behavior is normal. No agitation.      Assessment & Plan:

## 2013-10-26 NOTE — Assessment & Plan Note (Signed)
Lab Results  Component Value Date   HGBA1C 7.4* 10/18/2013   Overall fairly good control for age, asympt, to cont endo f/u, pt warned of temp increase in glc with prednison tx

## 2013-10-26 NOTE — Patient Instructions (Signed)
Please take all new medication as prescribed - the pain medication, muscle relaxer and prednisone  Please continue all other medications as before, and refills have been done if requested.  Please have the pharmacy call with any other refills you may need.  Please continue your efforts at being more active, low cholesterol diet, and weight control.  Please keep your appointments with your specialists as you may have planned  Please consider follow up with Dr Smith/sport medicine in our office if not improved in the next 3-5 days

## 2013-10-26 NOTE — Assessment & Plan Note (Signed)
With known cervical spine deg dz, no recent trauma or fall or fever; most c/w acute flare deg dz , neuro exam stable, for pain control, muscle relaxer, and prednisone taper , consider sport med if not improving in 3-5 days, to avoid driving with tramadol, to hold on further daily garden work until improved

## 2013-10-27 ENCOUNTER — Telehealth: Payer: Self-pay | Admitting: Internal Medicine

## 2013-10-27 NOTE — Telephone Encounter (Signed)
Relevant patient education assigned to patient using Emmi. ° °

## 2013-10-28 ENCOUNTER — Telehealth: Payer: Self-pay | Admitting: Endocrinology

## 2013-10-28 NOTE — Telephone Encounter (Signed)
Pt advised. He states he will D/C Nateglinide and will start Repaglinide.

## 2013-10-28 NOTE — Telephone Encounter (Addendum)
Pt called wanting to know if he needs to D/C his nateglinide. I looked through the pt chart could not located if the pt was to D/C the medication.  Please advise, Thanks!

## 2013-10-28 NOTE — Telephone Encounter (Signed)
Pt needs to see if he is to stop taking nateglinive 60mg 

## 2013-10-28 NOTE — Telephone Encounter (Signed)
Yes, the nateglinide was changed to repaglinide.

## 2013-11-09 ENCOUNTER — Ambulatory Visit (INDEPENDENT_AMBULATORY_CARE_PROVIDER_SITE_OTHER): Payer: Medicare Other | Admitting: Internal Medicine

## 2013-11-09 ENCOUNTER — Encounter: Payer: Self-pay | Admitting: Internal Medicine

## 2013-11-09 VITALS — BP 144/92 | HR 69 | Temp 98.3°F | Wt 157.2 lb

## 2013-11-09 DIAGNOSIS — M542 Cervicalgia: Secondary | ICD-10-CM | POA: Diagnosis not present

## 2013-11-09 DIAGNOSIS — I10 Essential (primary) hypertension: Secondary | ICD-10-CM | POA: Diagnosis not present

## 2013-11-09 DIAGNOSIS — R400 Somnolence: Secondary | ICD-10-CM

## 2013-11-09 DIAGNOSIS — R404 Transient alteration of awareness: Secondary | ICD-10-CM | POA: Diagnosis not present

## 2013-11-09 MED ORDER — PREDNISONE 10 MG PO TABS: ORAL_TABLET | ORAL | Status: DC

## 2013-11-09 MED ORDER — PREDNISONE 10 MG PO TABS: 10.0000 mg | ORAL_TABLET | Freq: Every day | ORAL | Status: DC

## 2013-11-09 MED ORDER — MELOXICAM 7.5 MG PO TABS: 7.5000 mg | ORAL_TABLET | Freq: Every day | ORAL | Status: DC

## 2013-11-09 NOTE — Patient Instructions (Addendum)
OK to stop the tramadol due to the sleepiness  Please take all new medication as prescribed - the repeat prednisone, and the Meloxicam for pain as needed  You can still take Tylenol as needed as well for pain  Please continue all other medications as before, and refills have been done if requested.  Please have the pharmacy call with any other refills you may need.  Please keep your appointments with your specialists as you may have planned

## 2013-11-09 NOTE — Progress Notes (Signed)
Subjective:    Patient ID: Jeffrey Kaiser, male    DOB: Sep 01, 1925, 80 y.o.   MRN: [id number]  HPI  Here to f/u recent visit,  Neck pain improved, but still mild, seemed to improve quite a bit with tx including prednisone.  Pt denies new neurological symptoms such as new headache, or facial or extremity weakness or numbness.  Now predpack finished, tramadol not working as well.   Pt denies chest pain, increased sob or doe, wheezing, orthopnea, PND, increased LE swelling, palpitations, dizziness or syncope.  Wife and daughter also asked him in due to increased daytime fatigue and sleepiness during the day, sees assoc with using the tramadol Past Medical History  Diagnosis Date  . Arthritis   . Diabetes mellitus   . Diverticulitis   . Glaucoma   . Hypertension   . Hyperlipidemia   . Hernia   . History of colonic diverticulitis 09/04/2010  . Anemia 09/04/2010  . DDD (degenerative disc disease), cervical 09/04/2010  . Diastolic dysfunction 11/17/7515  . Psoriasis 09/04/2010  . BPH (benign prostatic hyperplasia) 09/09/2010  . CAD (coronary artery disease)    Past Surgical History  Procedure Laterality Date  . Tonsillectomy  1952  . Broken legs  1981 and 1995  . S/p lipoma  2003    reports that he has never smoked. He does not have any smokeless tobacco history on file. He reports that he does not drink alcohol or use illicit drugs. family history includes Arthritis in his father and mother; Cancer in his other; Heart disease in his father and mother; Hypertension in his father and mother. Allergies  Allergen Reactions  . Levaquin [Levofloxacin In D5w] Nausea Only  . Tramadol Other (See Comments)    somnolence   Current Outpatient Prescriptions on File Prior to Visit  Medication Sig Dispense Refill  . aspirin 81 MG tablet Take 81 mg by mouth daily.      Marland Kitchen atorvastatin (LIPITOR) 20 MG tablet Take 1 tablet (20 mg total) by mouth daily.  90 tablet  3  . bromocriptine (PARLODEL) 2.5 MG  tablet Take 0.5 tablets (1.25 mg total) by mouth at bedtime.  15 tablet  11  . clobetasol (TEMOVATE) 0.05 % external solution as directed.        . fosinopril (MONOPRIL) 20 MG tablet TAKE 1 TABLET (20 MG TOTAL) BY MOUTH DAILY.  90 tablet  3  . glucose blood (ONETOUCH VERIO) test strip 1 each by Other route daily. And lancets 1/day 250.00  100 each  12  . isosorbide mononitrate (IMDUR) 60 MG 24 hr tablet TAKE 1/2 TABLET DAILY  30 tablet  11  . JANUVIA 100 MG tablet TAKE 1 TABLET (100 MG TOTAL) BY MOUTH DAILY.  90 tablet  1  . metFORMIN (GLUCOPHAGE) 500 MG tablet TAKE 2 TABLETS IN THE MORNING,1 TABLET AT NOON AND 2 TABLETS IN THE EVENING  450 tablet  3  . metoprolol (LOPRESSOR) 50 MG tablet TAKE 1 TABLET BY MOUTH EVERY DAY  30 tablet  6  . repaglinide (PRANDIN) 1 MG tablet Take 1 tablet (1 mg total) by mouth 3 (three) times daily before meals.  90 tablet  11  . tiZANidine (ZANAFLEX) 4 MG tablet Take 1 tablet (4 mg total) by mouth every 6 (six) hours as needed for muscle spasms.  60 tablet  0  . traMADol (ULTRAM) 50 MG tablet Take 1 tablet (50 mg total) by mouth every 6 (six) hours as needed.  60 tablet  1   No current facility-administered medications on file prior to visit.   Review of Systems  Constitutional: Negative for unusual diaphoresis or other sweats  HENT: Negative for ringing in ear Eyes: Negative for double vision or worsening visual disturbance.  Respiratory: Negative for choking and stridor.   Gastrointestinal: Negative for vomiting or other signifcant bowel change Genitourinary: Negative for hematuria or decreased urine volume.  Musculoskeletal: Negative for other MSK pain or swelling Skin: Negative for color change and worsening wound.  Neurological: Negative for tremors and numbness other than noted  Psychiatric/Behavioral: Negative for decreased concentration or agitation other than above       Objective:   Physical Exam BP 144/92  Pulse 69  Temp(Src) 98.3 F (36.8 C)  (Oral)  Wt 157 lb 4 oz (71.328 kg)  SpO2 97% VS noted,  Constitutional: Pt appears well-developed, well-nourished.  HENT: Head: NCAT.  Right Ear: External ear normal.  Left Ear: External ear normal.  Eyes: . Pupils are equal, round, and reactive to light. Conjunctivae and EOM are normal Neck: Normal range of motion. Neck supple.  Cardiovascular: Normal rate and regular rhythm.   Pulmonary/Chest: Effort normal and breath sounds normal.  Abd:  Soft, NT, ND, + BS Neurological: Pt is alert. Not confused , motor grossly intact Skin: Skin is warm. No rash Psychiatric: Pt behavior is normal. No agitation.     Assessment & Plan:

## 2013-11-09 NOTE — Progress Notes (Signed)
Pre visit review using our clinic review tool, if applicable. No additional management support is needed unless otherwise documented below in the visit note. 

## 2013-11-12 DIAGNOSIS — R400 Somnolence: Secondary | ICD-10-CM | POA: Insufficient documentation

## 2013-11-12 NOTE — Assessment & Plan Note (Signed)
Most likely related to tramadol, d/c tramadol, consider pulm referral for ?OSA if not improved

## 2013-11-12 NOTE — Assessment & Plan Note (Signed)
For repeat predpack asd, to f/u any worsening symptoms or concerns, also mobic prn,  to f/u any worsening symptoms or concerns

## 2013-11-12 NOTE — Assessment & Plan Note (Signed)
BP Readings from Last 3 Encounters:  11/09/13 144/92  10/26/13 174/80  10/18/13 136/72   Mild elev today, likely situational,  to f/u any worsening symptoms or concerns

## 2014-01-10 ENCOUNTER — Other Ambulatory Visit: Payer: Self-pay

## 2014-01-10 MED ORDER — METOPROLOL TARTRATE 50 MG PO TABS: 50.0000 mg | ORAL_TABLET | Freq: Every day | ORAL | Status: DC

## 2014-01-18 ENCOUNTER — Ambulatory Visit (INDEPENDENT_AMBULATORY_CARE_PROVIDER_SITE_OTHER): Payer: Medicare Other | Admitting: Endocrinology

## 2014-01-18 ENCOUNTER — Encounter: Payer: Self-pay | Admitting: Endocrinology

## 2014-01-18 VITALS — BP 130/64 | HR 71 | Temp 97.8°F | Ht 66.0 in | Wt 161.0 lb

## 2014-01-18 DIAGNOSIS — Z23 Encounter for immunization: Secondary | ICD-10-CM | POA: Diagnosis not present

## 2014-01-18 DIAGNOSIS — E1159 Type 2 diabetes mellitus with other circulatory complications: Secondary | ICD-10-CM | POA: Diagnosis not present

## 2014-01-18 LAB — BASIC METABOLIC PANEL
BUN: 18 mg/dL (ref 6–23)
CHLORIDE: 103 meq/L (ref 96–112)
CO2: 27 meq/L (ref 19–32)
Calcium: 9.5 mg/dL (ref 8.4–10.5)
Creatinine, Ser: 1 mg/dL (ref 0.4–1.5)
GFR: 76.08 mL/min (ref >60.00)
Glucose, Bld: 160 mg/dL — ABNORMAL HIGH (ref 70–99)
Potassium: 4.2 mEq/L (ref 3.5–5.1)
Sodium: 139 mEq/L (ref 135–145)

## 2014-01-18 LAB — HEMOGLOBIN A1C: Hgb A1c MFr Bld: 7.3 % — ABNORMAL HIGH (ref 4.6–6.5)

## 2014-01-18 NOTE — Patient Instructions (Signed)
check your blood sugar once a day.  vary the time of day when you check, between before the 3 meals, and at bedtime.  also check if you have symptoms of your blood sugar being too high or too low.  please keep a record of the readings and bring it to your next appointment here.  You can write it on any piece of paper.  please call us sooner if your blood sugar goes below 70, or if you have a lot of readings over 200.  blood tests are being requested for you today.  We'll contact you with results.   If it is high, we'll add "invokana." Please come back for a follow-up appointment in 4 months.     

## 2014-01-18 NOTE — Progress Notes (Signed)
Subjective:    Patient ID: Jeffrey Kaiser, male    DOB: [birth year], 80 y.o.   MRN: [id number]  HPI Pt returns for f/u of diabetes mellitus: DM type: 2 Dx'ed: 7035 Complications: CAD Therapy: insulin since DKA: never Severe hypoglycemia: never Pancreatitis: never Other: he has never been on insulin; he takes 4 oral meds Interval history: no cbg record, but states cbg's are almost all in the low-100's.  pt states she feels well in general.   Past Medical History  Diagnosis Date  . Arthritis   . Diabetes mellitus   . Diverticulitis   . Glaucoma   . Hypertension   . Hyperlipidemia   . Hernia   . History of colonic diverticulitis 09/04/2010  . Anemia 09/04/2010  . DDD (degenerative disc disease), cervical 09/04/2010  . Diastolic dysfunction 0/12/3816  . Psoriasis 09/04/2010  . BPH (benign prostatic hyperplasia) 09/09/2010  . CAD (coronary artery disease)     Past Surgical History  Procedure Laterality Date  . Tonsillectomy  1952  . Broken legs  1981 and 1995  . S/p lipoma  2003    History   Social History  . Marital Status: Single    Spouse Name: N/A    Number of Children: N/A  . Years of Education: N/A   Occupational History  . retired    Social History Main Topics  . Smoking status: Never Smoker   . Smokeless tobacco: Not on file  . Alcohol Use: No  . Drug Use: No  . Sexual Activity: Not on file   Other Topics Concern  . Not on file   Social History Narrative  . No narrative on file    Current Outpatient Prescriptions on File Prior to Visit  Medication Sig Dispense Refill  . aspirin 81 MG tablet Take 81 mg by mouth daily.      Marland Kitchen atorvastatin (LIPITOR) 20 MG tablet Take 1 tablet (20 mg total) by mouth daily.  90 tablet  3  . bromocriptine (PARLODEL) 2.5 MG tablet Take 0.5 tablets (1.25 mg total) by mouth at bedtime.  15 tablet  11  . clobetasol (TEMOVATE) 0.05 % external solution as directed.        . fosinopril (MONOPRIL) 20 MG tablet TAKE 1 TABLET (20  MG TOTAL) BY MOUTH DAILY.  90 tablet  3  . glucose blood (ONETOUCH VERIO) test strip 1 each by Other route daily. And lancets 1/day 250.00  100 each  12  . isosorbide mononitrate (IMDUR) 60 MG 24 hr tablet TAKE 1/2 TABLET DAILY  30 tablet  11  . JANUVIA 100 MG tablet TAKE 1 TABLET (100 MG TOTAL) BY MOUTH DAILY.  90 tablet  1  . meloxicam (MOBIC) 7.5 MG tablet Take 1 tablet (7.5 mg total) by mouth daily. As needed for pain  90 tablet  1  . metFORMIN (GLUCOPHAGE) 500 MG tablet TAKE 2 TABLETS IN THE MORNING,1 TABLET AT NOON AND 2 TABLETS IN THE EVENING  450 tablet  3  . metoprolol (LOPRESSOR) 50 MG tablet Take 1 tablet (50 mg total) by mouth daily.  30 tablet  11  . repaglinide (PRANDIN) 1 MG tablet Take 1 tablet (1 mg total) by mouth 3 (three) times daily before meals.  90 tablet  11  . tiZANidine (ZANAFLEX) 4 MG tablet Take 1 tablet (4 mg total) by mouth every 6 (six) hours as needed for muscle spasms.  60 tablet  0  . traMADol (ULTRAM) 50 MG tablet Take 1  tablet (50 mg total) by mouth every 6 (six) hours as needed.  60 tablet  1   No current facility-administered medications on file prior to visit.    Allergies  Allergen Reactions  . Levaquin [Levofloxacin In D5w] Nausea Only  . Tramadol Other (See Comments)    somnolence    Family History  Problem Relation Age of Onset  . Arthritis Mother   . Heart disease Mother   . Hypertension Mother   . Arthritis Father   . Heart disease Father   . Hypertension Father   . Cancer Other     lung cancer    BP 130/64  Pulse 71  Temp(Src) 97.8 F (36.6 C) (Oral)  Ht 5\' 6"  (1.676 m)  Wt 161 lb (73.029 kg)  BMI 26.00 kg/m2  SpO2 93%    Review of Systems Denies weight change and hypoglycemia.      Objective:   Physical Exam VITAL SIGNS:  See vs page GENERAL: no distress Pulses: dorsalis pedis intact bilat.  Feet: no deformity. normal color and temp. Trace bilat leg edema. Long toenails. There is bilateral onychomycosis.  Skin: no  ulcer on the feet.  Neuro: sensation is intact to touch on the feet.   Lab Results  Component Value Date   HGBA1C 7.3* 01/18/2014       Assessment & Plan:  DM: mild exacerbation. Noncompliance with cbg recording: I'll work around this as best I can.    Patient is advised the following: Patient Instructions  check your blood sugar once a day.  vary the time of day when you check, between before the 3 meals, and at bedtime.  also check if you have symptoms of your blood sugar being too high or too low.  please keep a record of the readings and bring it to your next appointment here.  You can write it on any piece of paper.  please call us sooner if your blood sugar goes below 70, or if you have a lot of readings over 200.  blood tests are being requested for you today.  We'll contact you with results.   If it is high, we'll add "invokana." Please come back for a follow-up appointment in 4 months.          addendum: Please add invokana

## 2014-01-23 ENCOUNTER — Telehealth: Payer: Self-pay | Admitting: Internal Medicine

## 2014-01-23 NOTE — Telephone Encounter (Signed)
error 

## 2014-01-24 ENCOUNTER — Ambulatory Visit (INDEPENDENT_AMBULATORY_CARE_PROVIDER_SITE_OTHER): Payer: Medicare Other | Admitting: Internal Medicine

## 2014-01-24 ENCOUNTER — Encounter: Payer: Self-pay | Admitting: Internal Medicine

## 2014-01-24 VITALS — BP 144/80 | HR 71 | Temp 98.2°F | Wt 159.0 lb

## 2014-01-24 DIAGNOSIS — M79662 Pain in left lower leg: Secondary | ICD-10-CM

## 2014-01-24 DIAGNOSIS — E119 Type 2 diabetes mellitus without complications: Secondary | ICD-10-CM | POA: Diagnosis not present

## 2014-01-24 DIAGNOSIS — R21 Rash and other nonspecific skin eruption: Secondary | ICD-10-CM

## 2014-01-24 DIAGNOSIS — I10 Essential (primary) hypertension: Secondary | ICD-10-CM | POA: Diagnosis not present

## 2014-01-24 MED ORDER — KETOCONAZOLE 2 % EX CREA: 1.0000 "application " | TOPICAL_CREAM | Freq: Every day | CUTANEOUS | Status: DC

## 2014-01-24 MED ORDER — ACETAMINOPHEN-CODEINE #3 300-30 MG PO TABS: 1.0000 | ORAL_TABLET | Freq: Four times a day (QID) | ORAL | Status: DC | PRN

## 2014-01-24 NOTE — Progress Notes (Signed)
Pre visit review using our clinic review tool, if applicable. No additional management support is needed unless otherwise documented below in the visit note. 

## 2014-01-24 NOTE — Patient Instructions (Addendum)
Please take all new medication as prescribed - the cream for the rash, as well as the pain medication (tylenol #3) as needed for pain  OK to use the tizanidine you have at home for the leg cramps at bedtime  You will be contacted regarding the referral for: left leg test to make sure no deep blood clot, asap  Please continue all other medications as before,   Please have the pharmacy call with any other refills you may need.  Please keep your appointments with your specialists as you may have planned

## 2014-01-24 NOTE — Progress Notes (Signed)
Subjective:    Patient ID: Jeffrey Kaiser, male    DOB: 1925-10-02, 80 y.o.   MRN: [id number]  HPI  Here to f/u, c/o rash to forskin and glans penis , itchy and somewhat painful, for 2 wks, nothing makes better or worse.  No phimosis.  Denies worsening reflux, abd pain, dysphagia, n/v, bowel change or blood.  Denies urinary symptoms such as dysuria, frequency, urgency, flank pain, hematuria or n/v, fever, chills.   Pt denies polydipsia, polyuria, .  Pt states overall good compliance with meds, trying to follow lower cholesterol, diabetic diet, wt overall stable but little exercise however.     Does also have tender firm area left post calf, with some calf swelling for 2 wks  Has tizainidine, not using currently for leg cramps Past Medical History  Diagnosis Date  . Arthritis   . Diabetes mellitus   . Diverticulitis   . Glaucoma   . Hypertension   . Hyperlipidemia   . Hernia   . History of colonic diverticulitis 09/04/2010  . Anemia 09/04/2010  . DDD (degenerative disc disease), cervical 09/04/2010  . Diastolic dysfunction 2/70/3500  . Psoriasis 09/04/2010  . BPH (benign prostatic hyperplasia) 09/09/2010  . CAD (coronary artery disease)    Past Surgical History  Procedure Laterality Date  . Tonsillectomy  1952  . Broken legs  1981 and 1995  . S/p lipoma  2003    reports that he has never smoked. He does not have any smokeless tobacco history on file. He reports that he does not drink alcohol or use illicit drugs. family history includes Arthritis in his father and mother; Cancer in his other; Heart disease in his father and mother; Hypertension in his father and mother. Allergies  Allergen Reactions  . Levaquin [Levofloxacin In D5w] Nausea Only  . Tramadol Other (See Comments)    somnolence   Current Outpatient Prescriptions on File Prior to Visit  Medication Sig Dispense Refill  . aspirin 81 MG tablet Take 81 mg by mouth daily.      Marland Kitchen atorvastatin (LIPITOR) 20 MG tablet Take 1  tablet (20 mg total) by mouth daily.  90 tablet  3  . bromocriptine (PARLODEL) 2.5 MG tablet Take 0.5 tablets (1.25 mg total) by mouth at bedtime.  15 tablet  11  . clobetasol (TEMOVATE) 0.05 % external solution as directed.        . fosinopril (MONOPRIL) 20 MG tablet TAKE 1 TABLET (20 MG TOTAL) BY MOUTH DAILY.  90 tablet  3  . glucose blood (ONETOUCH VERIO) test strip 1 each by Other route daily. And lancets 1/day 250.00  100 each  12  . isosorbide mononitrate (IMDUR) 60 MG 24 hr tablet TAKE 1/2 TABLET DAILY  30 tablet  11  . JANUVIA 100 MG tablet TAKE 1 TABLET (100 MG TOTAL) BY MOUTH DAILY.  90 tablet  1  . meloxicam (MOBIC) 7.5 MG tablet Take 1 tablet (7.5 mg total) by mouth daily. As needed for pain  90 tablet  1  . metFORMIN (GLUCOPHAGE) 500 MG tablet TAKE 2 TABLETS IN THE MORNING,1 TABLET AT NOON AND 2 TABLETS IN THE EVENING  450 tablet  3  . metoprolol (LOPRESSOR) 50 MG tablet Take 1 tablet (50 mg total) by mouth daily.  30 tablet  11  . repaglinide (PRANDIN) 1 MG tablet Take 1 tablet (1 mg total) by mouth 3 (three) times daily before meals.  90 tablet  11  . tiZANidine (ZANAFLEX) 4 MG  tablet Take 1 tablet (4 mg total) by mouth every 6 (six) hours as needed for muscle spasms.  60 tablet  0  . traMADol (ULTRAM) 50 MG tablet Take 1 tablet (50 mg total) by mouth every 6 (six) hours as needed.  60 tablet  1   No current facility-administered medications on file prior to visit.   Review of Systems  Constitutional: Negative for unusual diaphoresis or other sweats  HENT: Negative for ringing in ear Eyes: Negative for double vision or worsening visual disturbance.  Respiratory: Negative for choking and stridor.   Gastrointestinal: Negative for vomiting or other signifcant bowel change Genitourinary: Negative for hematuria or decreased urine volume.  Musculoskeletal: Negative for other MSK pain or swelling Skin: Negative for color change and worsening wound.  Neurological: Negative for tremors  and numbness other than noted  Psychiatric/Behavioral: Negative for decreased concentration or agitation other than above       Objective:   Physical Exam BP 144/80  Pulse 71  Temp(Src) 98.2 F (36.8 C) (Oral)  Wt 159 lb (72.122 kg)  SpO2 95% VS noted,  Constitutional: Pt appears well-developed, well-nourished.  HENT: Head: NCAT.  Right Ear: External ear normal.  Left Ear: External ear normal.  Eyes: . Pupils are equal, round, and reactive to light. Conjunctivae and EOM are normal Neck: Normal range of motion. Neck supple.  Cardiovascular: Normal rate and regular rhythm.   Pulmonary/Chest: Effort normal and breath sounds normal.  Abd:  Soft, NT, ND, + BS Neurological: Pt is alert. Not confused , motor grossly intact Skin: Skin is warm. No rash, left post calf with tender firmness ? Cord c/w superfic phlebitis Psychiatric: Pt behavior is normal. No agitation.  Foreskin at glans penis with nontender erythema, slight d/c    Assessment & Plan:

## 2014-01-25 ENCOUNTER — Ambulatory Visit (HOSPITAL_COMMUNITY): Payer: Medicare Other | Attending: Cardiology | Admitting: *Deleted

## 2014-01-25 DIAGNOSIS — E785 Hyperlipidemia, unspecified: Secondary | ICD-10-CM | POA: Diagnosis not present

## 2014-01-25 DIAGNOSIS — M79605 Pain in left leg: Secondary | ICD-10-CM | POA: Diagnosis not present

## 2014-01-25 DIAGNOSIS — I10 Essential (primary) hypertension: Secondary | ICD-10-CM | POA: Insufficient documentation

## 2014-01-25 DIAGNOSIS — I2510 Atherosclerotic heart disease of native coronary artery without angina pectoris: Secondary | ICD-10-CM | POA: Diagnosis not present

## 2014-01-25 DIAGNOSIS — M79662 Pain in left lower leg: Secondary | ICD-10-CM

## 2014-01-25 DIAGNOSIS — E119 Type 2 diabetes mellitus without complications: Secondary | ICD-10-CM | POA: Diagnosis not present

## 2014-01-25 NOTE — Progress Notes (Signed)
Venous duplex of the left lower extremity complete.

## 2014-01-30 DIAGNOSIS — R21 Rash and other nonspecific skin eruption: Secondary | ICD-10-CM | POA: Insufficient documentation

## 2014-01-30 DIAGNOSIS — M79669 Pain in unspecified lower leg: Secondary | ICD-10-CM | POA: Insufficient documentation

## 2014-01-30 NOTE — Assessment & Plan Note (Signed)
Suspect mild superfic phlebtiic area left post calf, for LLE venous doppler r/o DVT, cont asa, pain control

## 2014-01-30 NOTE — Assessment & Plan Note (Signed)
stable overall by history and exam, recent data reviewed with pt, and pt to continue medical treatment as before,  to f/u any worsening symptoms or concerns Lab Results  Component Value Date   HGBA1C 7.3* 01/18/2014    

## 2014-01-30 NOTE — Assessment & Plan Note (Signed)
prob yeastk for antifungal cream prn,  to f/u any worsening symptoms or concerns

## 2014-01-30 NOTE — Assessment & Plan Note (Signed)
stable overall by history and exam, recent data reviewed with pt, and pt to continue medical treatment as before,  to f/u any worsening symptoms or concerns BP Readings from Last 3 Encounters:  01/24/14 144/80  01/18/14 130/64  11/09/13 144/92

## 2014-02-01 ENCOUNTER — Ambulatory Visit (INDEPENDENT_AMBULATORY_CARE_PROVIDER_SITE_OTHER): Payer: Medicare Other | Admitting: Family Medicine

## 2014-02-01 ENCOUNTER — Encounter: Payer: Self-pay | Admitting: Family Medicine

## 2014-02-01 VITALS — BP 132/70 | HR 63 | Ht 65.0 in | Wt 161.0 lb

## 2014-02-01 DIAGNOSIS — M79662 Pain in left lower leg: Secondary | ICD-10-CM | POA: Diagnosis not present

## 2014-02-01 DIAGNOSIS — M5030 Other cervical disc degeneration, unspecified cervical region: Secondary | ICD-10-CM

## 2014-02-01 DIAGNOSIS — D508 Other iron deficiency anemias: Secondary | ICD-10-CM | POA: Diagnosis not present

## 2014-02-01 NOTE — Progress Notes (Signed)
[name] [address], [place] [address] Phone: [phone] Subjective:    I'm seeing this patient by the request  of:  Cathlean Cower, MD   CC: neck and left calf pain  [id number] Jeffrey Kaiser is a 80 y.o. male coming in with complaint of neck and back pain.  Patient has had neck pain for quite some time. Patient actually had pain so severe that he did have a CT scan in 2011. The CT scan was reviewed by me. Patient CT scan at that time did show diffuse degenerative changes with fusion of the posterior elements are seen at C5-C6. Patient states that he does have limited range of motion in does have some discomfort at the end of a long day. Patient states that he does repetitive activity it is severe. Patient states it is not stopping any of his activities and overall is not having any radiation down his arms or any weakness that he notes. Patient states though that it can be difficult to get comfortable at night. Patient states when he falls asleep he does do relatively well though. Patient would rate the pain up proximally 4 out of 10. Patient has been living with it for so long that he has become accustomed to it.  Patient is also complaining of left calf pain. She states that this is new. Patient was seen by primary care provider and they did rule out a deep venous thrombosis with ultrasound. Patient is taking a baby aspirin. Patient states that the pain usually only comes on when he is doing certain activities. Patient states at night he does have severe cramping. Denies any redness or swelling. States that it can be sore to touch. Patient states certain shoes seem to make it heal more beneficial. Rates the severity of this as 6 out of 10 when hurting but can be pain free from time to time.     Past medical history, social, surgical and family history all reviewed in electronic medical record.   Review of Systems: No headache, visual changes,  nausea, vomiting, diarrhea, constipation, dizziness, abdominal pain, skin rash, fevers, chills, night sweats, weight loss, swollen lymph nodes, body aches, joint swelling, muscle aches, chest pain, shortness of breath, mood changes.   Objective Blood pressure 132/70, pulse 63, height 5\' 5"  (1.651 m), weight 161 lb (73.029 kg), SpO2 98.00%.  General: No apparent distress alert and oriented x3 mood and affect normal, dressed appropriately.  HEENT: Pupils equal, extraocular movements intact  Respiratory: Patient's speak in full sentences and does not appear short of breath  Cardiovascular: No lower extremity edema, non tender, no erythema  Skin: Warm dry intact with no signs of infection or rash on extremities or on axial skeleton.  Abdomen: Soft nontender  Neuro: Cranial nerves II through XII are intact, neurovascularly intact in all extremities with 2+ DTRs and 2+ pulses.  Lymph: No lymphadenopathy of posterior or anterior cervical chain or axillae bilaterally.  Gait normal with good balance and coordination.  MSK:  Non tender with restricted symmetric range of motion and good stability and symmetric strength and tone of shoulders, elbows, wrist, hip, knee and ankles bilaterally. Severe osteoarthritic changes of multiple joints with some mild decreased range of motion but symmetric Neck: Severe kyphosis of the upper thoracic and lower cervical. No palpable stepoffs. Negative Spurling's maneuver. But does have crepitus with range of motion. Patient lacks 10 of rotation bilaterally and 20 of extension but does have full  flexion. Grip strength and sensation normal in bilateral hands Strength good C4 to T1 distribution No sensory change to C4 to T1 Negative Hoffman sign bilaterally Reflexes normal  Knee: Left Normal to inspection with no erythema or effusion or obvious bony abnormalities. Palpation normal with no warmth, joint line tenderness, patellar tenderness, or condyle tenderness.  Patient is tender over the medial gastroc head but no palpable defect noted ROM full in flexion and extension and lower leg rotation. Ligaments with solid consistent endpoints including ACL, PCL, LCL, MCL. Negative Mcmurray's, Apley's, and Thessalonian tests. Non painful patellar compression. Patellar glide with mild crepitus. Patellar and quadriceps tendons unremarkable. Hamstring and quadriceps strength is normal.      Impression and Recommendations:     This case required medical decision making of moderate complexity.

## 2014-02-01 NOTE — Patient Instructions (Addendum)
Good to meet you Ice 20 minutes 2 times daily. Usually after activity and before bed. Exercises 3 times a week.  Take tylenol 650 mg three times a day is the best evidence based medicine we have for arthritis.  Glucosamine sulfate 750mg  twice a day is a supplement that has been shown to help moderate to severe arthritis. Vitamin D 2000 IU daily Iron 325 mg three times weekly to help with the cramping.  Fish oil 2 grams daily.  Capsaicin topically up to four times a day may also help with pain. It's important that you continue to stay active. Controlling your weight is important.  Rigid sole shoes can help Consider a compression sleeve to your calf.  Water aerobics and cycling with low resistance are the best two types of exercise for arthritis. Come back and see me in 3 weeks.

## 2014-02-01 NOTE — Assessment & Plan Note (Signed)
Discussed iorn supplementation.

## 2014-02-01 NOTE — Assessment & Plan Note (Signed)
Patient's calf pain likely is muscular skeletal in nature. There is a possibility for radicular symptoms from the low back but patient denies any significant low back pain. Patient does not have any weakness on exam today. Patient will try conservative therapy with compression, over-the-counter medications, icing protocol as well as home exercises. Patient showed proper technique today. Patient will try these interventions and come back and see me again in 3-4 weeks for further evaluation. Continuing to have difficulty we may want to consider further workup including back x-rays as well as ABI. Patient is going to take iron with patients MCV dropping over the course of time.

## 2014-02-01 NOTE — Assessment & Plan Note (Signed)
Discussed with patient and patient's neck pain is already have autofusion on CT scan and is greater than 80 years old. Patient does have decreasing range of motion especially with extension. We discussed some postural exercises and was given some range of motion exercises. We discussed which activities to avoid. We will try some over-the-counter natural supplementation but I think will be very helpful. We discussed icing can be beneficial as well. Patient and will follow-up with me again in 4 weeks. Continuing pain we may want to consider formal physical therapy. Patient overall is doing relatively well.

## 2014-02-22 ENCOUNTER — Encounter: Payer: Self-pay | Admitting: Family Medicine

## 2014-02-22 ENCOUNTER — Ambulatory Visit: Payer: Medicare Other | Admitting: Internal Medicine

## 2014-02-22 ENCOUNTER — Ambulatory Visit (INDEPENDENT_AMBULATORY_CARE_PROVIDER_SITE_OTHER): Payer: Medicare Other | Admitting: Family Medicine

## 2014-02-22 VITALS — BP 136/62 | HR 63 | Wt 159.0 lb

## 2014-02-22 DIAGNOSIS — M5030 Other cervical disc degeneration, unspecified cervical region: Secondary | ICD-10-CM

## 2014-02-22 DIAGNOSIS — M79662 Pain in left lower leg: Secondary | ICD-10-CM

## 2014-02-22 NOTE — Progress Notes (Signed)
  [name] [address] [place],  [address] Phone: [phone] Subjective:     CC: neck and left calf pain follow up.   [id number] Jeffrey Kaiser is a 80 y.o. male coming in with complaint of neck and back pain.  Patient has had neck pain for quite some time. Patient actually had pain so severe that he did have a CT scan in 2011. The CT scan was reviewed by me. Patient CT scan at that time did show diffuse degenerative changes with fusion of the posterior elements are seen at C5-C6. Patient given HEP, icing and OTC vitamins, states about 7 times better.  Still has some limitation with range of motion.  Patient is also complaining of left calf pain. Patient was found to have some calf pain and possibly a calf strain. Patient was given home exercises and we discussed a compression sleeve which patient has been wearing fairly regularly. Has been doing some icing protocol as well. Patient states that this is also 70% better. This is not stopping him from any activities and is now waking him up at night.     Past medical history, social, surgical and family history all reviewed in electronic medical record.   Review of Systems: No headache, visual changes, nausea, vomiting, diarrhea, constipation, dizziness, abdominal pain, skin rash, fevers, chills, night sweats, weight loss, swollen lymph nodes, body aches, joint swelling, muscle aches, chest pain, shortness of breath, mood changes.   Objective Blood pressure 136/62, pulse 63, weight 159 lb (72.122 kg), SpO2 93 %.  General: No apparent distress alert and oriented x3 mood and affect normal, dressed appropriately.  HEENT: Pupils equal, extraocular movements intact  Respiratory: Patient's speak in full sentences and does not appear short of breath  Cardiovascular: No lower extremity edema, non tender, no erythema  Skin: Warm dry intact with no signs of infection or rash on extremities or on axial  skeleton.  Abdomen: Soft nontender  Neuro: Cranial nerves II through XII are intact, neurovascularly intact in all extremities with 2+ DTRs and 2+ pulses.  Lymph: No lymphadenopathy of posterior or anterior cervical chain or axillae bilaterally.  Gait normal with good balance and coordination.  MSK:  Non tender with restricted symmetric range of motion and good stability and symmetric strength and tone of shoulders, elbows, wrist, hip, knee and ankles bilaterally. Severe osteoarthritic changes of multiple joints with some mild decreased range of motion but symmetric Neck: Severe kyphosis of the upper thoracic and lower cervical. No palpable stepoffs.scoliosis noted Negative Spurling's maneuver. But does have crepitus with range of motion. Patient lacks 10 of rotation bilaterally and 20 of extension but does have full flexion. Grip strength and sensation normal in bilateral hands Strength good C4 to T1 distribution No sensory change to C4 to T1 Negative Hoffman sign bilaterally Reflexes normal No significant change from previous exam. Knee: Left Normal to inspection with no erythema or effusion or obvious bony abnormalities. Palpation normal with no warmth, joint line tenderness, patellar tenderness, or condyle tenderness. Patient nontender anymore. ROM full in flexion and extension and lower leg rotation. Ligaments with solid consistent endpoints including ACL, PCL, LCL, MCL. Negative Mcmurray's, Apley's, and Thessalonian tests. Non painful patellar compression. Patellar glide with mild crepitus. Patellar and quadriceps tendons unremarkable. Hamstring and quadriceps strength is normal.      Impression and Recommendations:     This case required medical decision making of moderate complexity.

## 2014-02-22 NOTE — Assessment & Plan Note (Signed)
This will likely be unfortunately a chronic problem for patient. We discussed formal physical therapy which patient declined. Patient is going to continue to work with a trainer that he is working with. We discussed an icing protocol and continuing the over-the-counter medications. Patient does have tramadol on hand if necessary as well as a muscle relaxer. Patient will continue with this conservative therapy as long as he continues to improve. Patient will follow-up again in 6 weeks for further evaluation.

## 2014-02-22 NOTE — Assessment & Plan Note (Signed)
Patient is doing very well with conservative therapy at this time Encourage him to continue compression and continue to increase his walking. If patient has any worsening pain once again we may need to consider radicular symptoms such as lumbar radiculopathy or ABI but at this point patient is doing well we will not make any changes.

## 2014-02-22 NOTE — Patient Instructions (Signed)
Good to see you Ice is your friend after activity Wear compression sleeve on calf with walking.  Conitnue everything else you are doing including the exercises at least 3 times a week See me again in 6 weeks.

## 2014-04-05 ENCOUNTER — Encounter: Payer: Self-pay | Admitting: Internal Medicine

## 2014-04-05 ENCOUNTER — Ambulatory Visit (INDEPENDENT_AMBULATORY_CARE_PROVIDER_SITE_OTHER): Payer: Medicare Other | Admitting: Internal Medicine

## 2014-04-05 VITALS — BP 132/82 | HR 60 | Temp 97.8°F | Ht 65.0 in | Wt 157.5 lb

## 2014-04-05 DIAGNOSIS — Z23 Encounter for immunization: Secondary | ICD-10-CM | POA: Diagnosis not present

## 2014-04-05 DIAGNOSIS — E119 Type 2 diabetes mellitus without complications: Secondary | ICD-10-CM

## 2014-04-05 DIAGNOSIS — E785 Hyperlipidemia, unspecified: Secondary | ICD-10-CM | POA: Diagnosis not present

## 2014-04-05 DIAGNOSIS — I10 Essential (primary) hypertension: Secondary | ICD-10-CM | POA: Diagnosis not present

## 2014-04-05 DIAGNOSIS — R21 Rash and other nonspecific skin eruption: Secondary | ICD-10-CM | POA: Diagnosis not present

## 2014-04-05 MED ORDER — KETOCONAZOLE 2 % EX CREA: 1.0000 "application " | TOPICAL_CREAM | Freq: Every day | CUTANEOUS | Status: DC

## 2014-04-05 NOTE — Progress Notes (Signed)
Pre visit review using our clinic review tool, if applicable. No additional management support is needed unless otherwise documented below in the visit note. 

## 2014-04-05 NOTE — Patient Instructions (Addendum)
You had the new Prevnar pneumonia shot today  Please continue all other medications as before, and refills have been done if requested.  Please have the pharmacy call with any other refills you may need.  Please keep your appointments with your specialists as you may have planned  Please return in 6 months, or sooner if needed

## 2014-04-05 NOTE — Progress Notes (Signed)
Subjective:    Patient ID: Jeffrey Kaiser, male    DOB: 1925/04/16, 80 y.o.   MRN: [id number]  HPI  Here to f/u, unfortunately with rash to scrotum which had improved with cream (which he prefers), rash now recurrent x 2 wks, itchy, no worsening pain, swelling, fever, or d/c.  Pt denies chest pain, increased sob or doe, wheezing, orthopnea, PND, increased LE swelling, palpitations, dizziness or syncope. Pt denies new neurological symptoms such as new headache, or facial or extremity weakness or numbness  Pt denies fever, wt loss, night sweats, loss of appetite, or other constitutional symptoms Due for prevnar.  Pt denies polydipsia, polyuria,  Past Medical History  Diagnosis Date  . Arthritis   . Diabetes mellitus   . Diverticulitis   . Glaucoma   . Hypertension   . Hyperlipidemia   . Hernia   . History of colonic diverticulitis 09/04/2010  . Anemia 09/04/2010  . DDD (degenerative disc disease), cervical 09/04/2010  . Diastolic dysfunction 06/28/4008  . Psoriasis 09/04/2010  . BPH (benign prostatic hyperplasia) 09/09/2010  . CAD (coronary artery disease)    Past Surgical History  Procedure Laterality Date  . Tonsillectomy  1952  . Broken legs  1981 and 1995  . S/p lipoma  2003    reports that he has never smoked. He does not have any smokeless tobacco history on file. He reports that he does not drink alcohol or use illicit drugs. family history includes Arthritis in his father and mother; Cancer in his other; Heart disease in his father and mother; Hypertension in his father and mother. Allergies  Allergen Reactions  . Levaquin [Levofloxacin In D5w] Nausea Only  . Tramadol Other (See Comments)    somnolence   Current Outpatient Prescriptions on File Prior to Visit  Medication Sig Dispense Refill  . acetaminophen-codeine (TYLENOL #3) 300-30 MG per tablet Take 1 tablet by mouth every 6 (six) hours as needed for moderate pain. 60 tablet 0  . aspirin 81 MG tablet Take 81 mg by mouth  daily.    Marland Kitchen atorvastatin (LIPITOR) 20 MG tablet Take 1 tablet (20 mg total) by mouth daily. 90 tablet 3  . bromocriptine (PARLODEL) 2.5 MG tablet Take 0.5 tablets (1.25 mg total) by mouth at bedtime. 15 tablet 11  . clobetasol (TEMOVATE) 0.05 % external solution as directed.      . fosinopril (MONOPRIL) 20 MG tablet TAKE 1 TABLET (20 MG TOTAL) BY MOUTH DAILY. 90 tablet 3  . glucose blood (ONETOUCH VERIO) test strip 1 each by Other route daily. And lancets 1/day 250.00 100 each 12  . isosorbide mononitrate (IMDUR) 60 MG 24 hr tablet TAKE 1/2 TABLET DAILY 30 tablet 11  . JANUVIA 100 MG tablet TAKE 1 TABLET (100 MG TOTAL) BY MOUTH DAILY. 90 tablet 1  . meloxicam (MOBIC) 7.5 MG tablet Take 1 tablet (7.5 mg total) by mouth daily. As needed for pain 90 tablet 1  . metoprolol (LOPRESSOR) 50 MG tablet Take 1 tablet (50 mg total) by mouth daily. 30 tablet 11  . repaglinide (PRANDIN) 1 MG tablet Take 1 tablet (1 mg total) by mouth 3 (three) times daily before meals. 90 tablet 11  . tiZANidine (ZANAFLEX) 4 MG tablet Take 1 tablet (4 mg total) by mouth every 6 (six) hours as needed for muscle spasms. 60 tablet 0  . traMADol (ULTRAM) 50 MG tablet Take 1 tablet (50 mg total) by mouth every 6 (six) hours as needed. 60 tablet 1  No current facility-administered medications on file prior to visit.   Review of Systems  Constitutional: Negative for unusual diaphoresis or other sweats  HENT: Negative for ringing in ear Eyes: Negative for double vision or worsening visual disturbance.  Respiratory: Negative for choking and stridor.   Gastrointestinal: Negative for vomiting or other signifcant bowel change Genitourinary: Negative for hematuria or decreased urine volume.  Musculoskeletal: Negative for other MSK pain or swelling Skin: Negative for color change and worsening wound.  Neurological: Negative for tremors and numbness other than noted  Psychiatric/Behavioral: Negative for decreased concentration or  agitation other than above       Objective:   Physical Exam BP 132/82 mmHg  Pulse 60  Temp(Src) 97.8 F (36.6 C) (Oral)  Ht 5\' 5"  (1.651 m)  Wt 157 lb 8 oz (71.442 kg)  BMI 26.21 kg/m2  SpO2 93% VS noted,  Constitutional: Pt appears well-developed, well-nourished.  HENT: Head: NCAT.  Right Ear: External ear normal.  Left Ear: External ear normal.  Eyes: . Pupils are equal, round, and reactive to light. Conjunctivae and EOM are normal Neck: Normal range of motion. Neck supple.  Cardiovascular: Normal rate and regular rhythm.   Pulmonary/Chest: Effort normal and breath sounds without rales or wheezing.  Neurological: Pt is alert. Not confused , motor grossly intact Skin: Skin is warm. No rash Psychiatric: Pt behavior is normal. No agitation.  GU: scrotum with NT diffuse erythema, no swelling or drainage    Assessment & Plan:

## 2014-04-10 ENCOUNTER — Other Ambulatory Visit: Payer: Self-pay | Admitting: Internal Medicine

## 2014-04-16 NOTE — Assessment & Plan Note (Signed)
stable overall by history and exam, recent data reviewed with pt, and pt to continue medical treatment as before,  to f/u any worsening symptoms or concerns Lab Results  Component Value Date   HGBA1C 7.3* 01/18/2014

## 2014-04-16 NOTE — Assessment & Plan Note (Signed)
stable overall by history and exam, recent data reviewed with pt, and pt to continue medical treatment as before,  to f/u any worsening symptoms or concerns BP Readings from Last 3 Encounters:  04/05/14 132/82  02/22/14 136/62  02/01/14 132/70

## 2014-04-16 NOTE — Assessment & Plan Note (Signed)
Mild to mod, for antifungal asd,  to f/u any worsening symptoms or concerns

## 2014-04-16 NOTE — Assessment & Plan Note (Signed)
stable overall by history and exam, recent data reviewed with pt, and pt to continue medical treatment as before, declines change in statin today,  to f/u any worsening symptoms or concerns Lab Results  Component Value Date   LDLCALC 125* 04/26/2013

## 2014-04-17 ENCOUNTER — Other Ambulatory Visit: Payer: Self-pay | Admitting: Endocrinology

## 2014-05-22 ENCOUNTER — Ambulatory Visit: Payer: Medicare Other | Admitting: Endocrinology

## 2014-05-23 ENCOUNTER — Ambulatory Visit: Payer: Medicare Other | Admitting: Endocrinology

## 2014-06-06 ENCOUNTER — Encounter: Payer: Self-pay | Admitting: Endocrinology

## 2014-06-06 ENCOUNTER — Ambulatory Visit (INDEPENDENT_AMBULATORY_CARE_PROVIDER_SITE_OTHER): Payer: Medicare Other | Admitting: Endocrinology

## 2014-06-06 VITALS — BP 144/82 | HR 82 | Temp 98.2°F | Ht 65.0 in | Wt 158.0 lb

## 2014-06-06 DIAGNOSIS — E1159 Type 2 diabetes mellitus with other circulatory complications: Secondary | ICD-10-CM | POA: Diagnosis not present

## 2014-06-06 NOTE — Patient Instructions (Addendum)
check your blood sugar once a day.  vary the time of day when you check, between before the 3 meals, and at bedtime.  also check if you have symptoms of your blood sugar being too high or too low.  please keep a record of the readings and bring it to your next appointment here.  You can write it on any piece of paper.  please call us sooner if your blood sugar goes below 70, or if you have a lot of readings over 200.  blood tests are being requested for you today.  We'll contact you with results.   If it is high, we'll add "invokana." Please come back for a follow-up appointment in 4 months.

## 2014-06-06 NOTE — Progress Notes (Signed)
Subjective:    Patient ID: Jeffrey Kaiser, male    DOB: 1925-05-11, 81 y.o.   MRN: [id number]  HPI Pt returns for f/u of diabetes mellitus: DM type: 2 Dx'ed: 3785 Complications: CAD Therapy: 4 oral meds DKA: never. Severe hypoglycemia: never Pancreatitis: never Other: he has never been on insulin.  Interval history: no cbg record, but states cbg's are well-controlled.  pt states she feels well in general.  Past Medical History  Diagnosis Date  . Arthritis   . Diabetes mellitus   . Diverticulitis   . Glaucoma   . Hypertension   . Hyperlipidemia   . Hernia   . History of colonic diverticulitis 09/04/2010  . Anemia 09/04/2010  . DDD (degenerative disc disease), cervical 09/04/2010  . Diastolic dysfunction 8/85/0277  . Psoriasis 09/04/2010  . BPH (benign prostatic hyperplasia) 09/09/2010  . CAD (coronary artery disease)     Past Surgical History  Procedure Laterality Date  . Tonsillectomy  1952  . Broken legs  1981 and 1995  . S/p lipoma  2003    History   Social History  . Marital Status: Single    Spouse Name: N/A  . Number of Children: N/A  . Years of Education: N/A   Occupational History  . retired    Social History Main Topics  . Smoking status: Never Smoker   . Smokeless tobacco: Not on file  . Alcohol Use: No  . Drug Use: No  . Sexual Activity: Not on file   Other Topics Concern  . Not on file   Social History Narrative    Current Outpatient Prescriptions on File Prior to Visit  Medication Sig Dispense Refill  . acetaminophen-codeine (TYLENOL #3) 300-30 MG per tablet Take 1 tablet by mouth every 6 (six) hours as needed for moderate pain. 60 tablet 0  . aspirin 81 MG tablet Take 81 mg by mouth daily.    . bromocriptine (PARLODEL) 2.5 MG tablet TAKE 1/2 TABLET AT BEDTIME 15 tablet 11  . clobetasol (TEMOVATE) 0.05 % external solution as directed.      . fosinopril (MONOPRIL) 20 MG tablet TAKE 1 TABLET (20 MG TOTAL) BY MOUTH DAILY. 90 tablet 3  .  glucose blood (ONETOUCH VERIO) test strip 1 each by Other route daily. And lancets 1/day 250.00 100 each 12  . isosorbide mononitrate (IMDUR) 60 MG 24 hr tablet TAKE 1/2 TABLET DAILY 30 tablet 11  . JANUVIA 100 MG tablet TAKE 1 TABLET (100 MG TOTAL) BY MOUTH DAILY. 90 tablet 1  . ketoconazole (NIZORAL) 2 % cream Apply 1 application topically daily. 60 g 0  . meloxicam (MOBIC) 7.5 MG tablet Take 1 tablet (7.5 mg total) by mouth daily. As needed for pain 90 tablet 1  . metFORMIN (GLUCOPHAGE) 500 MG tablet TAKE 2 TABLETS IN THE MORNING,1 TABLET AT NOON AND 2 TABLETS IN THE EVENING 450 tablet 3  . metoprolol (LOPRESSOR) 50 MG tablet Take 1 tablet (50 mg total) by mouth daily. 30 tablet 11  . repaglinide (PRANDIN) 1 MG tablet Take 1 tablet (1 mg total) by mouth 3 (three) times daily before meals. 90 tablet 11  . tiZANidine (ZANAFLEX) 4 MG tablet Take 1 tablet (4 mg total) by mouth every 6 (six) hours as needed for muscle spasms. 60 tablet 0  . traMADol (ULTRAM) 50 MG tablet Take 1 tablet (50 mg total) by mouth every 6 (six) hours as needed. 60 tablet 1  . atorvastatin (LIPITOR) 20 MG tablet Take 1  tablet (20 mg total) by mouth daily. 90 tablet 3   No current facility-administered medications on file prior to visit.    Allergies  Allergen Reactions  . Levaquin [Levofloxacin In D5w] Nausea Only  . Tramadol Other (See Comments)    somnolence    Family History  Problem Relation Age of Onset  . Arthritis Mother   . Heart disease Mother   . Hypertension Mother   . Arthritis Father   . Heart disease Father   . Hypertension Father   . Cancer Other     lung cancer    BP 144/82 mmHg  Pulse 82  Temp(Src) 98.2 F (36.8 C) (Oral)  Ht 5\' 5"  (1.651 m)  Wt 158 lb (71.668 kg)  BMI 26.29 kg/m2  SpO2 94%  Review of Systems He denies hypoglycemia and weight change    Objective:   Physical Exam VITAL SIGNS:  See vs page GENERAL: no distress Pulses: dorsalis pedis intact bilat.  Feet: no  deformity. normal color and temp. Trace bilat leg edema. Long toenails. There is bilateral onychomycosis.  Skin: no ulcer on the feet.  Neuro: sensation is intact to touch on the feet.       Assessment & Plan:  DM: uncertain control.  Patient is advised the following: Patient Instructions  check your blood sugar once a day.  vary the time of day when you check, between before the 3 meals, and at bedtime.  also check if you have symptoms of your blood sugar being too high or too low.  please keep a record of the readings and bring it to your next appointment here.  You can write it on any piece of paper.  please call us sooner if your blood sugar goes below 70, or if you have a lot of readings over 200.  blood tests are being requested for you today.  We'll contact you with results.   If it is high, we'll add "invokana." Please come back for a follow-up appointment in 4 months.

## 2014-06-12 ENCOUNTER — Other Ambulatory Visit: Payer: Self-pay | Admitting: Internal Medicine

## 2014-06-26 ENCOUNTER — Ambulatory Visit (INDEPENDENT_AMBULATORY_CARE_PROVIDER_SITE_OTHER): Payer: Medicare Other | Admitting: Family

## 2014-06-26 ENCOUNTER — Encounter: Payer: Self-pay | Admitting: Family

## 2014-06-26 VITALS — BP 162/90 | HR 82 | Temp 98.2°F | Resp 16 | Wt 156.4 lb

## 2014-06-26 DIAGNOSIS — R05 Cough: Secondary | ICD-10-CM | POA: Diagnosis not present

## 2014-06-26 DIAGNOSIS — R059 Cough, unspecified: Secondary | ICD-10-CM

## 2014-06-26 MED ORDER — AMOXICILLIN-POT CLAVULANATE 875-125 MG PO TABS: 1.0000 | ORAL_TABLET | Freq: Two times a day (BID) | ORAL | Status: DC

## 2014-06-26 NOTE — Assessment & Plan Note (Signed)
Symptoms and exam consistent with bacterial sinusitis. Start Augmentin. Start over-the-counter medications as needed for symptom relief and supportive care. Over-the-counter chart of medications provided to patient and family. Follow-up if symptoms worsen or fail to improve.

## 2014-06-26 NOTE — Progress Notes (Signed)
Subjective:    Patient ID: Jeffrey Kaiser, male    DOB: 02/25/1926, 81 y.o.   MRN: [id number]  Chief Complaint  Patient presents with  . Cough    x4 days, cough, drainage, congestion, sinus pressure, did run a fever but that has gone away    HPI:  Jeffrey Kaiser is a 81 y.o. male who presents today for an acute visit.  This is a new problem. Associated symptoms of cough, drainage, congestion, sinus pressure and a fever that has come and gone has been all going on for about 4 days. Has not tried any over the counter medications. Does have mucinex and coricidin. Denies any recent antibiotic use.   Allergies  Allergen Reactions  . Levaquin [Levofloxacin In D5w] Nausea Only  . Tramadol Other (See Comments)    somnolence    Current Outpatient Prescriptions on File Prior to Visit  Medication Sig Dispense Refill  . acetaminophen-codeine (TYLENOL #3) 300-30 MG per tablet Take 1 tablet by mouth every 6 (six) hours as needed for moderate pain. 60 tablet 0  . aspirin 81 MG tablet Take 81 mg by mouth daily.    . bromocriptine (PARLODEL) 2.5 MG tablet TAKE 1/2 TABLET AT BEDTIME 15 tablet 11  . clobetasol (TEMOVATE) 0.05 % external solution as directed.      . fosinopril (MONOPRIL) 20 MG tablet TAKE 1 TABLET (20 MG TOTAL) BY MOUTH DAILY. 90 tablet 3  . glucose blood (ONETOUCH VERIO) test strip 1 each by Other route daily. And lancets 1/day 250.00 100 each 12  . isosorbide mononitrate (IMDUR) 60 MG 24 hr tablet TAKE 1/2 TABLET DAILY 30 tablet 11  . JANUVIA 100 MG tablet TAKE 1 TABLET BY MOUTH EVERY DAY 90 tablet 1  . ketoconazole (NIZORAL) 2 % cream Apply 1 application topically daily. 60 g 0  . meloxicam (MOBIC) 7.5 MG tablet Take 1 tablet (7.5 mg total) by mouth daily. As needed for pain 90 tablet 1  . metFORMIN (GLUCOPHAGE) 500 MG tablet TAKE 2 TABLETS IN THE MORNING,1 TABLET AT NOON AND 2 TABLETS IN THE EVENING 450 tablet 3  . metoprolol (LOPRESSOR) 50 MG tablet Take 1 tablet (50 mg total)  by mouth daily. 30 tablet 11  . repaglinide (PRANDIN) 1 MG tablet Take 1 tablet (1 mg total) by mouth 3 (three) times daily before meals. 90 tablet 11  . tiZANidine (ZANAFLEX) 4 MG tablet Take 1 tablet (4 mg total) by mouth every 6 (six) hours as needed for muscle spasms. 60 tablet 0  . traMADol (ULTRAM) 50 MG tablet Take 1 tablet (50 mg total) by mouth every 6 (six) hours as needed. 60 tablet 1  . atorvastatin (LIPITOR) 20 MG tablet Take 1 tablet (20 mg total) by mouth daily. 90 tablet 3   No current facility-administered medications on file prior to visit.     Review of Systems  Constitutional: Positive for fever.  HENT: Positive for congestion, sinus pressure and sore throat.   Respiratory: Positive for cough. Negative for chest tightness and shortness of breath.   Neurological: Positive for headaches.      Objective:    BP 162/90 mmHg  Pulse 82  Temp(Src) 98.2 F (36.8 C) (Oral)  Resp 16  Wt 156 lb 6.4 oz (70.943 kg)  SpO2 97% Nursing note and vital signs reviewed.  Physical Exam  Constitutional: He is oriented to person, place, and time. He appears well-developed and well-nourished. No distress.  HENT:  Right Ear: Hearing,  tympanic membrane, external ear and ear canal normal.  Left Ear: Hearing, tympanic membrane, external ear and ear canal normal.  Nose: Right sinus exhibits maxillary sinus tenderness and frontal sinus tenderness. Left sinus exhibits maxillary sinus tenderness and frontal sinus tenderness.  Mouth/Throat: Uvula is midline, oropharynx is clear and moist and mucous membranes are normal.  Cardiovascular: Normal rate, regular rhythm, normal heart sounds and intact distal pulses.   Pulmonary/Chest: Effort normal and breath sounds normal.  Neurological: He is alert and oriented to person, place, and time.  Skin: Skin is warm and dry.  Psychiatric: He has a normal mood and affect. His behavior is normal. Judgment and thought content normal.       Assessment &  Plan:

## 2014-06-26 NOTE — Progress Notes (Signed)
Pre visit review using our clinic review tool, if applicable. No additional management support is needed unless otherwise documented below in the visit note. 

## 2014-06-26 NOTE — Patient Instructions (Addendum)
Thank you for choosing [facility].  Summary/Instructions:  Your prescription(s) have been submitted to your pharmacy or been printed and provided for you. Please take as directed and contact our office if you believe you are having problem(s) with the medication(s) or have any questions.  Please stop by radiology on the basement level of the building for your x-rays. Your results will be released to [facility] (or called to you) after review, usually within 72 hours after test completion. If any treatments or changes are necessary, you will be notified at that same time.  If your symptoms worsen or fail to improve, please contact our office for further instruction, or in case of emergency go directly to the emergency room at the closest medical facility.    General Recommendations:    Please drink plenty of fluids.  Get plenty of rest   Sleep in humidified air  Use saline nasal sprays  Netti pot   OTC Medications:  Decongestants - helps relieve congestion   Flonase (generic fluticasone) or Nasacort (generic triamcinolone) - please make sure to use the "cross-over" technique at a 45 degree angle towards the opposite eye as opposed to straight up the nasal passageway.   If you have HIGH BLOOD PRESSURE - Coricidin HBP; AVOID any product that is -D as this contains pseudoephedrine which may increase your blood pressure.  Afrin (oxymetazoline) every 6-8 hours for up to 3 days.   Allergies - helps relieve runny nose, itchy eyes and sneezing   Claritin (generic loratidine), Allegra (fexofenidine), or Zyrtec (generic cyrterizine) for runny nose. These medications should not cause drowsiness.  Note - Benadryl (generic diphenhydramine) may be used however may cause drowsiness  Cough -   Delsym or Robitussin (generic dextromethorphan)  Expectorants - helps loosen mucus to ease removal   Mucinex (generic guaifenesin) as directed on the package.  Headaches / General  Aches   Tylenol (generic acetaminophen) - DO NOT EXCEED 3 grams (3,000 mg) in a 24 hour time period  Advil/Motrin (generic ibuprofen)   Sore Throat -   Salt water gargle   Chloraseptic (generic benzocaine) spray or lozenges / Sucrets (generic dyclonine)

## 2014-06-28 ENCOUNTER — Telehealth: Payer: Self-pay | Admitting: *Deleted

## 2014-06-28 NOTE — Telephone Encounter (Signed)
[place] Night - Client [facility] Patient Name: Jeffrey Kaiser Gender: Male DOB: December 01, 1925 Age: 81 Y 11 M 28 D Return Phone Number: [phone] (Secondary), [phone] (Alternate) Address: [address] City/State/Zip: [place] [address] Client [name] - Client Client Site [facility] - Night Physician Cathlean Cower Contact Type Call Call Type Triage / Clinical Caller Name Jeffrey Kaiser Relationship To Patient Daughter Return Phone Number [phone] (Alternate) Chief Complaint Medication Question (non symptomatic) Initial Comment Caller says her father spoke with a nurse and she said to get an OTC, but she does not see it at the store. Calling from [phone] Nurse Assessment Nurse: [name], RN, Baker Janus Date/Time [name] Time): 06/25/2014 8:33:00 PM Confirm and document reason for call. If symptomatic, describe symptoms. ---Baxter Flattery daughter called back can't have muccinex as it has tylenol in it and she is not sure which one one to get. Has the patient traveled out of the country within the last 30 days? ---No Does the patient require triage? ---No Please document clinical information provided and list any resource used. ---Nurse advised her not to get any of the mucinex and encourage him to drink plenty of fluids and take two plain 325 mg tablets for discomfort; and see MD in the morning. call for appt. Guidelines Guideline Title Affirmed Question Affirmed Notes Nurse Date/Time (Eastern Time) Disp. Time [name] Time) Disposition Final User 06/25/2014 8:14:19 PM Send To RN Personal Norton Blizzard 06/25/2014 8:36:28 PM Clinical Call Yes [name], RN, Baker Janus After Care Instructions Given Call Event Type User Date / Time Description

## 2014-06-28 NOTE — Telephone Encounter (Signed)
[facility] Night - Client TELEPHONE [facility] Patient Name: Jeffrey Kaiser Gender: Male DOB: 04-01-1926 Age: 81 Y 11 M 26 D Return Phone Number: [phone] (Primary) Address: [address] City/State/Zip: [place] [address] Client [name] - Client Client Site [facility] - Night Physician Cathlean Cower Contact Type Call Call Type Triage / Clinical Caller Name renee haislip Relationship To Patient Daughter Return Phone Number [phone] (Primary) Chief Complaint Sore Throat Initial Comment Caller states her father is not feeling well, congested, fever, sore throat, and chills. PreDisposition Call Doctor Nurse Assessment Nurse: Ruthann Cancer, RN, Apolonio Schneiders Date/Time [name] Time): 06/24/2014 5:59:53 PM Confirm and document reason for call. If symptomatic, describe symptoms. ---Caller states her father is not feeling well, congested, fever, sore throat, and chills. Temp is 101. He is achy & doesn't have an appetite. He did eat a little bit earlier today. He is not drinking as much as usual but seems to be going to the bathroom/urinating ok. He does have a bit of a cough. Has the patient traveled out of the country within the last 30 days? ---No Does the patient require triage? ---Yes Related visit to physician within the last 2 weeks? ---No Does the PT have any chronic conditions? (i.e. diabetes, asthma, etc.) ---Yes List chronic conditions. ---type 2 diabetes; HTN; heart issues Guidelines Guideline Title Affirmed Question Affirmed Notes Nurse Date/Time (Eastern Time) Sore Throat Diabetes mellitus or weak immune system (e.g., HIV positive, cancer chemo, splenectomy, organ transplant) Ruthann Cancer, RN, Apolonio Schneiders 06/24/2014 6:03:55 PM Disp. Time [name] Time) Disposition Final User 06/24/2014 6:09:06 PM See Physician within 24 Hours Yes Ruthann Cancer, RN, Herminio Heads Understands: Yes PLEASE NOTE: All  timestamps contained within this report are represented as [place] Standard Time. CONFIDENTIALTY NOTICE: This fax transmission is intended only for the addressee. It contains information that is legally privileged, confidential or otherwise protected from use or disclosure. If you are not the intended recipient, you are strictly prohibited from reviewing, disclosing, copying using or disseminating any of this information or taking any action in reliance on or regarding this information. If you have received this fax in error, please notify us immediately by telephone so that we can arrange for its return to [place]. Phone: [phone], Toll-Free: [phone], Fax: [phone] Page: 2 of 2 Call Id: [id number] Disagree/Comply: Comply Care Advice Given Per Guideline SEE PHYSICIAN WITHIN 24 HOURS: SORE THROAT - For relief of sore throat: * Sip warm chicken broth or apple juice. * Suck on hard candy or a throat lozenge (OTC). * Gargle with warm salt water four times a day. To make salt water, put 1/2 teaspoon of salt in 8 oz (240 ml) of warm water. PAIN OR FEVER MEDICINES: ACETAMINOPHEN (E.G., TYLENOL): * Take 650 mg (two 325 mg pills) by mouth every 4-6 hours as needed. Each Regular Strength Tylenol pill has 325 mg of acetaminophen. The most you should take each day is 3,250 mg (10 Regular Strength pills a day). SOFT DIET: * Eat a soft diet. Cold drinks, popsicles, and milk shakes are especially good. Avoid citrus fruits. * Drink plenty of liquids so as to avoid dehydration (8-12 eight oz glasses each day). CALL BACK IF: * You become worse. CARE ADVICE given per Sore Throat (Adult) guideline. * IF OFFICE WILL BE CLOSED AND NO PCP TRIAGE: You need to be examined within the next 24 hours. Go to _________ at your convenience. After Care Instructions Given Call Event Type User  Date / Time Descriptio

## 2014-06-28 NOTE — Telephone Encounter (Signed)
[facility] Night - Client [place] Patient Name: Jeffrey Kaiser Gender: Male DOB: April 05, 1926 Age: 81 Y 11 M 27 D Return Phone Number: [phone] (Primary), [phone] (Secondary), [phone] (Secondary) Address: [address] City/State/Zip: [place] [address] Client [name] - Client Client Site [facility] - Night Physician John, Finley Type Call Call Type Triage / Clinical Relationship To Patient Self Return Phone Number [phone] (Secondary) Chief Complaint Cough Initial Comment Caller states sinus drainage going into throat causing cough, no fever, wants to know what he can take w/reg meds PreDisposition Call Doctor Nurse Assessment Nurse: [name], RN, Baker Janus Date/Time [name] Time): 06/25/2014 6:57:38 PM Confirm and document reason for call. If symptomatic, describe symptoms. ---Jeffrey Kaiser is experiencing sinus drainage draining into back of throat causing him to cough. and making him hoarse. onset 3 days Has the patient traveled out of the country within the last 30 days? ---No Does the patient require triage? ---Yes Related visit to physician within the last 2 weeks? ---No Does the PT have any chronic conditions? (i.e. diabetes, asthma, etc.) ---Yes List chronic conditions. ---HTN Diabetes 2 Guidelines Guideline Title Affirmed Question Affirmed Notes Nurse Date/Time [name] Time) Sinus Pain or Congestion Lots of coughing Jeffrey Kaiser 06/25/2014 7:01:04 PM Disp. Time [name] Time) Disposition Final User 06/25/2014 7:04:58 PM See PCP When Office is Open (within 3 days) Yes [name], RN, [name] Understands: Yes Disagree/Comply: Comply Care Advice Given Per Guideline PLEASE NOTE: All timestamps contained within this report are represented as [place] Standard Time. CONFIDENTIALTY NOTICE: This fax transmission is intended only for the addressee. It  contains information that is legally privileged, confidential or otherwise protected from use or disclosure. If you are not the intended recipient, you are strictly prohibited from reviewing, disclosing, copying using or disseminating any of this information or taking any action in reliance on or regarding this information. If you have received this fax in error, please notify us immediately by telephone so that we can arrange for its return to [place]. Phone: [phone], Toll-Free: [phone], Fax: [phone] Page: 2 of 2 Call Id: [id number] Care Advice Given Per Guideline SEE PCP WITHIN 3 DAYS: You need to be examined within 2 or 3 days. Call your doctor during regular office hours and make an appointment. (Note: if office will be open tomorrow, tell caller to call then, not in 3 days). * How it Helps: The salt water rinses out excess mucus, washes out any irritants (dust, allergens) that might be present, and moistens the nasal cavity. * Methods: There are several ways to perform nasal irrigation. You can use a saline nasal spray bottle (available over-the-counter), a rubber ear syringe, a medical syringe without the needle, or a NETI POT. STEP-BY-STEP INSTRUCTIONS: * STEP 1: Lean over a sink. * STEP 2: Gently squirt or spray warm salt water into one of your nostrils. * STEP 3: Some of the water may run into the back of your throat. Spit this out. If you swallow the salt water it will not hurt you. * STEP 4: Blow your nose to clean out the water and mucus. * STEP 5: Repeat steps 1-4 for the other nostril. You can do this a couple times a day if it seems to help you. ACETAMINOPHEN (E.G., TYLENOL): * Difficulty breathing (and not relieved by cleaning out nose) * You become worse. CARE ADVICE given per Sinus Pain or Congestion (Adult) guideline. After Care Instructions Given Call  Event Type User Date / Time Description

## 2014-06-28 NOTE — Telephone Encounter (Signed)
[facility] - Client [name] Patient Name: Jeffrey Kaiser Gender: Male DOB: Aug 06, 1925 Age: 81 Y 11 M 27 D Return Phone Number: [phone] (Primary), [phone] (Secondary), [phone] (Secondary) Address: [address] City/State/Zip: [place] [address] Client [name] - Client Client Site [facility] - Night Physician [place], [place] Type Call Call Type Triage / Clinical Relationship To Patient Self Return Phone Number [phone] (Secondary) Chief Complaint Cough Initial Comment Caller states sinus drainage going into throat causing cough, no fever, wants to know what he can take w/reg meds PreDisposition Call Doctor Nurse Assessment Nurse: [name], RN, Baker Janus Date/Time [name] Time): 06/25/2014 6:57:38 PM Confirm and document reason for call. If symptomatic, describe symptoms. ---Jeffrey Kaiser is experiencing sinus drainage draining into back of throat causing him to cough. and making him hoarse. onset 3 days Has the patient traveled out of the country within the last 30 days? ---No Does the patient require triage? ---Yes Related visit to physician within the last 2 weeks? ---No Does the PT have any chronic conditions? (i.e. diabetes, asthma, etc.) ---Yes List chronic conditions. ---HTN Diabetes 2 Guidelines Guideline Title Affirmed Question Affirmed Notes Nurse Date/Time [name] Time) Sinus Pain or Congestion Lots of coughing Jeffrey Kaiser 06/25/2014 7:01:04 PM Disp. Time [name] Time) Disposition Final User 06/25/2014 7:04:58 PM See PCP When Office is Open (within 3 days) Yes [name], RN, [name] Understands: Yes Disagree/Comply: Comply Care Advice Given Per Guideline PLEASE NOTE: All timestamps contained within this report are represented as [place] Standard Time. CONFIDENTIALTY NOTICE: This fax transmission is intended only for the addressee. It  contains information that is legally privileged, confidential or otherwise protected from use or disclosure. If you are not the intended recipient, you are strictly prohibited from reviewing, disclosing, copying using or disseminating any of this information or taking any action in reliance on or regarding this information. If you have received this fax in error, please notify us immediately by telephone so that we can arrange for its return to [place]. Phone: [phone], Toll-Free: [phone], Fax: [phone] Page: 2 of 2 Call Id: [id number] Care Advice Given Per Guideline SEE PCP WITHIN 3 DAYS: You need to be examined within 2 or 3 days. Call your doctor during regular office hours and make an appointment. (Note: if office will be open tomorrow, tell caller to call then, not in 3 days). * How it Helps: The salt water rinses out excess mucus, washes out any irritants (dust, allergens) that might be present, and moistens the nasal cavity. * Methods: There are several ways to perform nasal irrigation. You can use a saline nasal spray bottle (available over-the-counter), a rubber ear syringe, a medical syringe without the needle, or a NETI POT. STEP-BY-STEP INSTRUCTIONS: * STEP 1: Lean over a sink. * STEP 2: Gently squirt or spray warm salt water into one of your nostrils. * STEP 3: Some of the water may run into the back of your throat. Spit this out. If you swallow the salt water it will not hurt you. * STEP 4: Blow your nose to clean out the water and mucus. * STEP 5: Repeat steps 1-4 for the other nostril. You can do this a couple times a day if it seems to help you. ACETAMINOPHEN (E.G., TYLENOL): * Difficulty breathing (and not relieved by cleaning out nose) * You become worse. CARE ADVICE given per Sinus Pain or Congestion (Adult) guideline. After Care Instructions Given Call  Event Type User Date / Time Description

## 2014-07-02 ENCOUNTER — Other Ambulatory Visit: Payer: Self-pay | Admitting: Internal Medicine

## 2014-08-11 ENCOUNTER — Other Ambulatory Visit: Payer: Self-pay | Admitting: Internal Medicine

## 2014-09-14 ENCOUNTER — Other Ambulatory Visit: Payer: Self-pay | Admitting: *Deleted

## 2014-09-14 ENCOUNTER — Other Ambulatory Visit: Payer: Self-pay

## 2014-09-14 MED ORDER — METOPROLOL TARTRATE 50 MG PO TABS: 50.0000 mg | ORAL_TABLET | Freq: Every day | ORAL | Status: DC

## 2014-09-14 MED ORDER — BROMOCRIPTINE MESYLATE 2.5 MG PO TABS: 1.2500 mg | ORAL_TABLET | Freq: Every day | ORAL | Status: DC

## 2014-09-18 ENCOUNTER — Other Ambulatory Visit: Payer: Self-pay | Admitting: Internal Medicine

## 2014-10-05 ENCOUNTER — Ambulatory Visit (INDEPENDENT_AMBULATORY_CARE_PROVIDER_SITE_OTHER): Payer: Medicare Other | Admitting: Internal Medicine

## 2014-10-05 ENCOUNTER — Encounter: Payer: Self-pay | Admitting: Internal Medicine

## 2014-10-05 ENCOUNTER — Other Ambulatory Visit (INDEPENDENT_AMBULATORY_CARE_PROVIDER_SITE_OTHER): Payer: Medicare Other

## 2014-10-05 VITALS — BP 154/80 | HR 89 | Temp 97.7°F | Ht 64.0 in | Wt 155.0 lb

## 2014-10-05 DIAGNOSIS — E1159 Type 2 diabetes mellitus with other circulatory complications: Secondary | ICD-10-CM | POA: Diagnosis not present

## 2014-10-05 DIAGNOSIS — M79605 Pain in left leg: Secondary | ICD-10-CM

## 2014-10-05 DIAGNOSIS — Z23 Encounter for immunization: Secondary | ICD-10-CM

## 2014-10-05 DIAGNOSIS — N471 Phimosis: Secondary | ICD-10-CM | POA: Insufficient documentation

## 2014-10-05 DIAGNOSIS — M79604 Pain in right leg: Secondary | ICD-10-CM | POA: Insufficient documentation

## 2014-10-05 DIAGNOSIS — E785 Hyperlipidemia, unspecified: Secondary | ICD-10-CM

## 2014-10-05 DIAGNOSIS — I10 Essential (primary) hypertension: Secondary | ICD-10-CM

## 2014-10-05 LAB — CBC WITH DIFFERENTIAL/PLATELET
BASOS ABS: 0 10*3/uL (ref 0.0–0.1)
Basophils Relative: 0.4 % (ref 0.0–3.0)
Eosinophils Absolute: 0.1 10*3/uL (ref 0.0–0.7)
Eosinophils Relative: 1.5 % (ref 0.0–5.0)
HCT: 39.6 % (ref 39.0–52.0)
Hemoglobin: 13.3 g/dL (ref 13.0–17.0)
LYMPHS PCT: 18.7 % (ref 12.0–46.0)
Lymphs Abs: 1.6 10*3/uL (ref 0.7–4.0)
MCHC: 33.7 g/dL (ref 30.0–36.0)
MCV: 84 fl (ref 78.0–100.0)
MONOS PCT: 8.4 % (ref 3.0–12.0)
Monocytes Absolute: 0.7 10*3/uL (ref 0.1–1.0)
Neutro Abs: 6.3 10*3/uL (ref 1.4–7.7)
Neutrophils Relative %: 71 % (ref 43.0–77.0)
Platelets: 189 10*3/uL (ref 150.0–400.0)
RBC: 4.71 Mil/uL (ref 4.22–5.81)
RDW: 14.7 % (ref 11.5–15.5)
WBC: 8.8 10*3/uL (ref 4.0–10.5)

## 2014-10-05 LAB — URINALYSIS, ROUTINE W REFLEX MICROSCOPIC
BILIRUBIN URINE: NEGATIVE
HGB URINE DIPSTICK: NEGATIVE
Ketones, ur: NEGATIVE
Leukocytes, UA: NEGATIVE
NITRITE: NEGATIVE
RBC / HPF: NONE SEEN (ref 0–?)
Specific Gravity, Urine: 1.02 (ref 1.000–1.030)
Total Protein, Urine: NEGATIVE
Urine Glucose: NEGATIVE
Urobilinogen, UA: 1 (ref 0.0–1.0)
WBC, UA: NONE SEEN (ref 0–?)
pH: 7 (ref 5.0–8.0)

## 2014-10-05 LAB — LIPID PANEL
CHOL/HDL RATIO: 4
Cholesterol: 150 mg/dL (ref 0–200)
HDL: 37.2 mg/dL — AB (ref >39.00)
LDL Cholesterol: 89 mg/dL (ref 0–99)
NONHDL: 112.8
Triglycerides: 121 mg/dL (ref 0.0–149.0)
VLDL: 24.2 mg/dL (ref 0.0–40.0)

## 2014-10-05 LAB — TSH: TSH: 4.74 u[IU]/mL — ABNORMAL HIGH (ref 0.35–4.50)

## 2014-10-05 LAB — MICROALBUMIN / CREATININE URINE RATIO
Creatinine,U: 95.6 mg/dL
MICROALB/CREAT RATIO: 1.6 mg/g (ref 0.0–30.0)
Microalb, Ur: 1.5 mg/dL (ref 0.0–1.9)

## 2014-10-05 LAB — BASIC METABOLIC PANEL
BUN: 20 mg/dL (ref 6–23)
CALCIUM: 9.9 mg/dL (ref 8.4–10.5)
CHLORIDE: 103 meq/L (ref 96–112)
CO2: 31 mEq/L (ref 19–32)
Creatinine, Ser: 0.9 mg/dL (ref 0.40–1.50)
GFR: 84.78 mL/min (ref >60.00)
GLUCOSE: 180 mg/dL — AB (ref 70–99)
POTASSIUM: 5.3 meq/L — AB (ref 3.5–5.1)
SODIUM: 140 meq/L (ref 135–145)

## 2014-10-05 LAB — HEPATIC FUNCTION PANEL
ALBUMIN: 4.1 g/dL (ref 3.5–5.2)
ALT: 13 U/L (ref 0–53)
AST: 18 U/L (ref 0–37)
Alkaline Phosphatase: 56 U/L (ref 39–117)
Bilirubin, Direct: 0.2 mg/dL (ref 0.0–0.3)
TOTAL PROTEIN: 7.6 g/dL (ref 6.0–8.3)
Total Bilirubin: 0.7 mg/dL (ref 0.2–1.2)

## 2014-10-05 LAB — HEMOGLOBIN A1C: Hgb A1c MFr Bld: 7.8 % — ABNORMAL HIGH (ref 4.6–6.5)

## 2014-10-05 MED ORDER — CLOBETASOL PROPIONATE 0.05 % EX SOLN: Freq: Two times a day (BID) | CUTANEOUS | Status: AC

## 2014-10-05 NOTE — Assessment & Plan Note (Addendum)
stable overall by history and exam, recent data reviewed with pt, and pt to continue medical treatment as before,  to f/u any worsening symptoms or concerns Lab Results  Component Value Date   HGBA1C 7.3* 01/18/2014   Note:  Total time for pt hx, exam, review of record with pt in the room, determination of diagnoses and plan for further eval and tx is > 40 min, with over 50% spent in coordination and counseling of patient

## 2014-10-05 NOTE — Assessment & Plan Note (Signed)
?   MSk with ambulating further distances, doubt neurogenic, but cant r/o vascular - for LE art dopplers/ABI, cont to work on vascular mod risk factors

## 2014-10-05 NOTE — Progress Notes (Signed)
Subjective:    Patient ID: Jeffrey Kaiser, male    DOB: December 13, 1925, 81 y.o.   MRN: [id number]  HPI    Here for yearly f/u;  Overall doing ok;  Pt denies Chest pain, worsening SOB, DOE, wheezing, orthopnea, PND, worsening LE edema, palpitations, dizziness or syncope.  Pt denies neurological change such as new headache, facial or extremity weakness.  Pt denies polydipsia, polyuria, or low sugar symptoms. Pt states overall good compliance with treatment and medications, good tolerability, and has been trying to follow appropriate diet.  Pt denies worsening depressive symptoms, suicidal ideation or panic. No fever, night sweats, wt loss, loss of appetite, or other constitutional symptoms.  Pt states good ability with ADL's, has low fall risk, home safety reviewed and adequate, no other significant changes in hearing or vision, and only occasionally active with exercise.  Did have what sounds like sinus/uri like symptoms 3-4 wks ago (actually mar 2016 he means by emr) tx with augmentin that did seem to help, in addition to tyelenol.  Did have some bloody nasal d/c at the time, but none since.  [place] Tonita Cong, but very sore, neck hurt, and "legs were so tired" that he was helped in the facility with a wheelchair, then was able to walk out again to the car.  Asks for muscle relaxer, No back pain assoc. Some tightness to the shoulders occas.  Also still has some whitich spots to left prox inner thigh and penis he thinks may be fungal?    Past Medical History  Diagnosis Date  . Arthritis   . Diabetes mellitus   . Diverticulitis   . Glaucoma   . Hypertension   . Hyperlipidemia   . Hernia   . History of colonic diverticulitis 09/04/2010  . Anemia 09/04/2010  . DDD (degenerative disc disease), cervical 09/04/2010  . Diastolic dysfunction 11/23/2991  . Psoriasis 09/04/2010  . BPH (benign prostatic hyperplasia) 09/09/2010  . CAD (coronary artery disease)     Past Surgical History  Procedure Laterality Date  . Tonsillectomy  1952  . Broken legs  1981 and 1995  . S/p lipoma  2003    reports that he has never smoked. He does not have any smokeless tobacco history on file. He reports that he does not drink alcohol or use illicit drugs. family history includes Arthritis in his father and mother; Cancer in his other; Heart disease in his father and mother; Hypertension in his father and mother. Allergies  Allergen Reactions  . Levaquin [Levofloxacin In D5w] Nausea Only  . Tramadol Other (See Comments)    somnolence   Current Outpatient Prescriptions on File Prior to Visit  Medication Sig Dispense Refill  . acetaminophen-codeine (TYLENOL #3) 300-30 MG per tablet Take 1 tablet by mouth every 6 (six) hours as needed for moderate pain. 60 tablet 0  . amoxicillin-clavulanate (AUGMENTIN) 875-125 MG per tablet Take 1 tablet by mouth 2 (two) times daily. (Patient not taking: Reported on 10/05/2014) 20 tablet 0  . aspirin 81 MG tablet Take 81 mg by mouth daily.    Marland Kitchen atorvastatin (LIPITOR) 20 MG tablet TAKE 1 TABLET (20 MG TOTAL) BY MOUTH DAILY. 90 tablet 1  . bromocriptine (PARLODEL) 2.5 MG tablet Take 0.5 tablets (1.25 mg total) by mouth at bedtime. 45 tablet 2  . clobetasol (TEMOVATE) 0.05 % external solution as directed.      . fosinopril (MONOPRIL) 20 MG tablet TAKE 1  TABLET (20 MG TOTAL) BY MOUTH DAILY. 90 tablet 2  . glucose blood (ONETOUCH VERIO) test strip 1 each by Other route daily. And lancets 1/day 250.00 100 each 12  . isosorbide mononitrate (IMDUR) 60 MG 24 hr tablet Take 0.5 tablets (30 mg total) by mouth daily. 45 tablet 3  . JANUVIA 100 MG tablet TAKE 1 TABLET BY MOUTH EVERY DAY 90 tablet 1  . ketoconazole (NIZORAL) 2 % cream Apply 1 application topically daily. 60 g 0  . meloxicam (MOBIC) 7.5 MG tablet Take 1 tablet (7.5 mg total) by mouth daily. As needed for pain (Patient not taking: Reported on 10/05/2014) 90 tablet 1  . metFORMIN  (GLUCOPHAGE) 500 MG tablet TAKE 2 TABLETS IN THE MORNING,1 TABLET AT NOON AND 2 TABLETS IN THE EVENING 450 tablet 3  . metoprolol (LOPRESSOR) 50 MG tablet Take 1 tablet (50 mg total) by mouth daily. 90 tablet 1  . repaglinide (PRANDIN) 1 MG tablet Take 1 tablet (1 mg total) by mouth 3 (three) times daily before meals. 90 tablet 11  . tiZANidine (ZANAFLEX) 4 MG tablet Take 1 tablet (4 mg total) by mouth every 6 (six) hours as needed for muscle spasms. (Patient not taking: Reported on 10/05/2014) 60 tablet 0  . traMADol (ULTRAM) 50 MG tablet Take 1 tablet (50 mg total) by mouth every 6 (six) hours as needed. (Patient not taking: Reported on 10/05/2014) 60 tablet 1   No current facility-administered medications on file prior to visit.      Review of Systems Constitutional: Negative for increased diaphoresis, other activity, appetite or siginficant weight change other than noted HENT: Negative for worsening hearing loss, ear pain, facial swelling, mouth sores and neck stiffness.   Eyes: Negative for other worsening pain, redness or visual disturbance.  Respiratory: Negative for shortness of breath and wheezing  Cardiovascular: Negative for chest pain and palpitations.  Gastrointestinal: Negative for diarrhea, blood in stool, abdominal distention or other pain Genitourinary: Negative for hematuria, flank pain or change in urine volume.  Musculoskeletal: Negative for myalgias or other joint complaints.  Skin: Negative for color change and wound or drainage.  Neurological: Negative for syncope and numbness. other than noted Hematological: Negative for adenopathy. or other swelling Psychiatric/Behavioral: Negative for hallucinations, SI, self-injury, decreased concentration or other worsening agitation.      Objective:   Physical Exam BP 154/80 mmHg  Pulse 89  Temp(Src) 97.7 F (36.5 C) (Oral)  Ht 5\' 4"  (1.626 m)  Wt 155 lb (70.308 kg)  BMI 26.59 kg/m2  SpO2 97% VS noted,  Constitutional:  Pt is oriented to person, place, and time. Appears well-developed and well-nourished, in no significant distress Head: Normocephalic and atraumatic.  Right Ear: External ear normal.  Left Ear: External ear normal.  Nose: Nose normal.  Mouth/Throat: Oropharynx is clear and moist.  Eyes: Conjunctivae and EOM are normal. Pupils are equal, round, and reactive to light.  Neck: Reduced range of motion to extnesion. Neck supple. No JVD present. No tracheal deviation present or significant neck LA or mass Cardiovascular: Normal rate, regular rhythm, normal heart sounds and intact distal pulses.   Pulmonary/Chest: Effort normal and breath sounds without rales or wheezing  Abdominal: Soft. Bowel sounds are normal. NT. No HSM  Musculoskeletal: Normal range of motion. Exhibits no edema.  Lymphadenopathy:  Has no cervical adenopathy.  Neurological: Pt is alert and oriented to person, place, and time. Pt has normal reflexes. No cranial nerve deficit. Motor grossly intact, dorsalis pedis trace bilat  Skin: Skin is warm and dry. + rash noted. to phimosis distally, unable to retract easily Psychiatric:  Has normal mood and affect. Behavior is normal.      Assessment & Plan:

## 2014-10-05 NOTE — Assessment & Plan Note (Signed)
stable overall by history and exam, recent data reviewed with pt, and pt to continue medical treatment as before,  to f/u any worsening symptoms or concerns Lab Results  Component Value Date   LDLCALC 125* 04/26/2013   To cont statin, f/u labs today

## 2014-10-05 NOTE — Progress Notes (Signed)
Pre visit review using our clinic review tool, if applicable. No additional management support is needed unless otherwise documented below in the visit note. 

## 2014-10-05 NOTE — Assessment & Plan Note (Signed)
Mild elev today, o/w stable overall by history and exam, recent data reviewed with pt, and pt to continue medical treatment as before as he declines any med changes - concerned abou tside effects,  to f/u any worsening symptoms or concerns BP Readings from Last 3 Encounters:  10/05/14 154/80  06/26/14 162/90  06/06/14 144/82

## 2014-10-05 NOTE — Addendum Note (Signed)
Addended by: Lyman Bishop on: 10/05/2014 09:52 AM   Modules accepted: Orders

## 2014-10-05 NOTE — Patient Instructions (Addendum)
You had Tetanus (Td) shot today  Please continue all other medications as before, including the fungal cream, and also the the other cream for rash on the back  Please have the pharmacy call with any other refills you may need.  Please continue your efforts at being more active, low cholesterol diet, and weight control.  You are otherwise up to date with prevention measures today.  Please keep your appointments with your specialists as you may have planned  You will be contacted regarding the referral for: Lower extremity leg circulation testing  Please go to the LAB in the Basement (turn left off the elevator) for the tests to be done today  You will be contacted by phone if any changes need to be made immediately.  Otherwise, you will receive a letter about your results with an explanation, but please check with MyChart first.  Please remember to sign up for MyChart if you have not done so, as this will be important to you in the future with finding out test results, communicating by private email, and scheduling acute appointments online when needed.  Please return in 6 months, or sooner if needed

## 2014-10-05 NOTE — Assessment & Plan Note (Signed)
D/with pt - ok to continue ketoconozole cream use, declines urology referral for how but may reconsider

## 2014-10-06 ENCOUNTER — Ambulatory Visit: Payer: Medicare Other | Admitting: Endocrinology

## 2014-10-10 ENCOUNTER — Ambulatory Visit (INDEPENDENT_AMBULATORY_CARE_PROVIDER_SITE_OTHER): Payer: Medicare Other | Admitting: Endocrinology

## 2014-10-10 ENCOUNTER — Encounter: Payer: Self-pay | Admitting: Endocrinology

## 2014-10-10 ENCOUNTER — Other Ambulatory Visit: Payer: Self-pay | Admitting: Internal Medicine

## 2014-10-10 VITALS — BP 137/84 | HR 81 | Temp 98.0°F | Wt 156.0 lb

## 2014-10-10 DIAGNOSIS — I739 Peripheral vascular disease, unspecified: Secondary | ICD-10-CM

## 2014-10-10 DIAGNOSIS — E875 Hyperkalemia: Secondary | ICD-10-CM | POA: Diagnosis not present

## 2014-10-10 DIAGNOSIS — M79604 Pain in right leg: Secondary | ICD-10-CM

## 2014-10-10 DIAGNOSIS — M79605 Pain in left leg: Principal | ICD-10-CM

## 2014-10-10 MED ORDER — CANAGLIFLOZIN 100 MG PO TABS: 100.0000 mg | ORAL_TABLET | Freq: Every day | ORAL | Status: DC

## 2014-10-10 NOTE — Progress Notes (Signed)
Subjective:    Patient ID: Jeffrey Kaiser, male    DOB: 17-Aug-1925, 81 y.o.   MRN: [id number]  HPI Pt returns for f/u of diabetes mellitus: DM type: 2 Dx'ed: 2620 Complications: CAD Therapy: 4 oral meds DKA: never. Severe hypoglycemia: never Pancreatitis: never Other: he has never been on insulin.  Interval history: no cbg record, but states cbg's are well-controlled.  pt states she feels well in general.  Past Medical History  Diagnosis Date  . Arthritis   . Diabetes mellitus   . Diverticulitis   . Glaucoma   . Hypertension   . Hyperlipidemia   . Hernia   . History of colonic diverticulitis 09/04/2010  . Anemia 09/04/2010  . DDD (degenerative disc disease), cervical 09/04/2010  . Diastolic dysfunction 3/55/9741  . Psoriasis 09/04/2010  . BPH (benign prostatic hyperplasia) 09/09/2010  . CAD (coronary artery disease)     Past Surgical History  Procedure Laterality Date  . Tonsillectomy  1952  . Broken legs  1981 and 1995  . S/p lipoma  2003    History   Social History  . Marital Status: Single    Spouse Name: N/A  . Number of Children: N/A  . Years of Education: N/A   Occupational History  . retired    Social History Main Topics  . Smoking status: Never Smoker   . Smokeless tobacco: Not on file  . Alcohol Use: No  . Drug Use: No  . Sexual Activity: Not on file   Other Topics Concern  . Not on file   Social History Narrative    Current Outpatient Prescriptions on File Prior to Visit  Medication Sig Dispense Refill  . acetaminophen-codeine (TYLENOL #3) 300-30 MG per tablet Take 1 tablet by mouth every 6 (six) hours as needed for moderate pain. 60 tablet 0  . aspirin 81 MG tablet Take 81 mg by mouth daily.    Marland Kitchen atorvastatin (LIPITOR) 20 MG tablet TAKE 1 TABLET (20 MG TOTAL) BY MOUTH DAILY. 90 tablet 1  . bromocriptine (PARLODEL) 2.5 MG tablet Take 0.5 tablets (1.25 mg total) by mouth at bedtime. 45 tablet 2  . clobetasol (TEMOVATE) 0.05 % external  solution Apply topically 2 (two) times daily. 50 mL 1  . fosinopril (MONOPRIL) 20 MG tablet TAKE 1 TABLET (20 MG TOTAL) BY MOUTH DAILY. 90 tablet 2  . glucose blood (ONETOUCH VERIO) test strip 1 each by Other route daily. And lancets 1/day 250.00 100 each 12  . isosorbide mononitrate (IMDUR) 60 MG 24 hr tablet Take 0.5 tablets (30 mg total) by mouth daily. 45 tablet 3  . JANUVIA 100 MG tablet TAKE 1 TABLET BY MOUTH EVERY DAY 90 tablet 1  . ketoconazole (NIZORAL) 2 % cream Apply 1 application topically daily. 60 g 0  . meloxicam (MOBIC) 7.5 MG tablet Take 1 tablet (7.5 mg total) by mouth daily. As needed for pain 90 tablet 1  . metFORMIN (GLUCOPHAGE) 500 MG tablet TAKE 2 TABLETS IN THE MORNING,1 TABLET AT NOON AND 2 TABLETS IN THE EVENING 450 tablet 3  . metoprolol (LOPRESSOR) 50 MG tablet Take 1 tablet (50 mg total) by mouth daily. 90 tablet 1  . repaglinide (PRANDIN) 1 MG tablet Take 1 tablet (1 mg total) by mouth 3 (three) times daily before meals. 90 tablet 11  . tiZANidine (ZANAFLEX) 4 MG tablet Take 1 tablet (4 mg total) by mouth every 6 (six) hours as needed for muscle spasms. 60 tablet 0  . traMADol (  ULTRAM) 50 MG tablet Take 1 tablet (50 mg total) by mouth every 6 (six) hours as needed. 60 tablet 1   No current facility-administered medications on file prior to visit.    Allergies  Allergen Reactions  . Levaquin [Levofloxacin In D5w] Nausea Only  . Tramadol Other (See Comments)    somnolence    Family History  Problem Relation Age of Onset  . Arthritis Mother   . Heart disease Mother   . Hypertension Mother   . Arthritis Father   . Heart disease Father   . Hypertension Father   . Cancer Other     lung cancer    BP 137/84 mmHg  Pulse 81  Temp(Src) 98 F (36.7 C) (Oral)  Wt 156 lb (70.761 kg)  SpO2 95%    Review of Systems He denies hypoglycemia.      Objective:   Physical Exam VITAL SIGNS:  See vs page GENERAL: no distress CV: dorsalis pedis intact bilat.    Feet: no deformity. normal color and temp. Trace bilat leg edema. Long toenails. There is bilateral onychomycosis.  Skin: no ulcer on the feet.  Neuro: sensation is intact to touch on the feet   Lab Results  Component Value Date   HGBA1C 7.8* 10/05/2014      Assessment & Plan:  DM: he needs increased rx, if it can be done with a regimen that avoids or minimizes hypoglycemia.   Patient is advised the following: Patient Instructions  check your blood sugar once a day.  vary the time of day when you check, between before the 3 meals, and at bedtime.  also check if you have symptoms of your blood sugar being too high or too low.  please keep a record of the readings and bring it to your next appointment here.  You can write it on any piece of paper.  please call us sooner if your blood sugar goes below 70, or if you have a lot of readings over 200.  i have sent a prescription to your pharmacy, to add "invokana." Please come back for a follow-up appointment in 4 months.   Please do a potassium blood test in the lab in 1-2 weeks.

## 2014-10-10 NOTE — Patient Instructions (Addendum)
check your blood sugar once a day.  vary the time of day when you check, between before the 3 meals, and at bedtime.  also check if you have symptoms of your blood sugar being too high or too low.  please keep a record of the readings and bring it to your next appointment here.  You can write it on any piece of paper.  please call us sooner if your blood sugar goes below 70, or if you have a lot of readings over 200.  i have sent a prescription to your pharmacy, to add "invokana." Please come back for a follow-up appointment in 4 months.   Please do a potassium blood test in the lab in 1-2 weeks.

## 2014-10-13 ENCOUNTER — Ambulatory Visit (HOSPITAL_COMMUNITY): Payer: Medicare Other | Attending: Cardiovascular Disease

## 2014-10-13 ENCOUNTER — Other Ambulatory Visit: Payer: Self-pay | Admitting: Internal Medicine

## 2014-10-13 ENCOUNTER — Telehealth: Payer: Self-pay | Admitting: Internal Medicine

## 2014-10-13 DIAGNOSIS — M79605 Pain in left leg: Secondary | ICD-10-CM | POA: Diagnosis not present

## 2014-10-13 DIAGNOSIS — E119 Type 2 diabetes mellitus without complications: Secondary | ICD-10-CM | POA: Diagnosis not present

## 2014-10-13 DIAGNOSIS — M79604 Pain in right leg: Secondary | ICD-10-CM | POA: Diagnosis not present

## 2014-10-13 DIAGNOSIS — E785 Hyperlipidemia, unspecified: Secondary | ICD-10-CM | POA: Insufficient documentation

## 2014-10-13 DIAGNOSIS — I2510 Atherosclerotic heart disease of native coronary artery without angina pectoris: Secondary | ICD-10-CM | POA: Insufficient documentation

## 2014-10-13 DIAGNOSIS — I700 Atherosclerosis of aorta: Secondary | ICD-10-CM | POA: Insufficient documentation

## 2014-10-13 DIAGNOSIS — I10 Essential (primary) hypertension: Secondary | ICD-10-CM | POA: Diagnosis not present

## 2014-10-13 DIAGNOSIS — I739 Peripheral vascular disease, unspecified: Secondary | ICD-10-CM

## 2014-10-13 DIAGNOSIS — I70203 Unspecified atherosclerosis of native arteries of extremities, bilateral legs: Secondary | ICD-10-CM | POA: Insufficient documentation

## 2014-10-13 NOTE — Telephone Encounter (Signed)
Patient had procedure today and patients daughter Jeffrey Kaiser would like you to call her. She's wondering if there is anything special they need to till his next visit. Her #is [phone].  She is on [facility]

## 2014-10-16 ENCOUNTER — Encounter: Payer: Self-pay | Admitting: *Deleted

## 2014-10-16 NOTE — Telephone Encounter (Signed)
Pt advised to contact office or facility that conducted procedure for after care advisements

## 2014-10-17 ENCOUNTER — Encounter: Payer: Self-pay | Admitting: *Deleted

## 2014-10-17 ENCOUNTER — Ambulatory Visit (INDEPENDENT_AMBULATORY_CARE_PROVIDER_SITE_OTHER): Payer: Medicare Other | Admitting: Cardiovascular Disease

## 2014-10-17 ENCOUNTER — Encounter: Payer: Self-pay | Admitting: Cardiovascular Disease

## 2014-10-17 DIAGNOSIS — Z01812 Encounter for preprocedural laboratory examination: Secondary | ICD-10-CM

## 2014-10-17 DIAGNOSIS — I739 Peripheral vascular disease, unspecified: Secondary | ICD-10-CM | POA: Insufficient documentation

## 2014-10-17 DIAGNOSIS — M79606 Pain in leg, unspecified: Secondary | ICD-10-CM | POA: Diagnosis not present

## 2014-10-17 DIAGNOSIS — I2510 Atherosclerotic heart disease of native coronary artery without angina pectoris: Secondary | ICD-10-CM | POA: Diagnosis not present

## 2014-10-17 DIAGNOSIS — E785 Hyperlipidemia, unspecified: Secondary | ICD-10-CM

## 2014-10-17 DIAGNOSIS — I10 Essential (primary) hypertension: Secondary | ICD-10-CM

## 2014-10-17 LAB — CBC WITH DIFFERENTIAL/PLATELET
Basophils Absolute: 0 10*3/uL (ref 0.0–0.1)
Basophils Relative: 0.1 % (ref 0.0–3.0)
EOS ABS: 0.2 10*3/uL (ref 0.0–0.7)
Eosinophils Relative: 1.8 % (ref 0.0–5.0)
HCT: 38.4 % — ABNORMAL LOW (ref 39.0–52.0)
HEMOGLOBIN: 12.7 g/dL — AB (ref 13.0–17.0)
LYMPHS ABS: 1.5 10*3/uL (ref 0.7–4.0)
Lymphocytes Relative: 16.5 % (ref 12.0–46.0)
MCHC: 33 g/dL (ref 30.0–36.0)
MCV: 85.1 fl (ref 78.0–100.0)
MONO ABS: 0.6 10*3/uL (ref 0.1–1.0)
Monocytes Relative: 6.7 % (ref 3.0–12.0)
NEUTROS PCT: 74.9 % (ref 43.0–77.0)
Neutro Abs: 6.8 10*3/uL (ref 1.4–7.7)
Platelets: 159 10*3/uL (ref 150.0–400.0)
RBC: 4.51 Mil/uL (ref 4.22–5.81)
RDW: 14.5 % (ref 11.5–15.5)
WBC: 9 10*3/uL (ref 4.0–10.5)

## 2014-10-17 LAB — BASIC METABOLIC PANEL
BUN: 15 mg/dL (ref 6–23)
CALCIUM: 9.8 mg/dL (ref 8.4–10.5)
CHLORIDE: 103 meq/L (ref 96–112)
CO2: 31 meq/L (ref 19–32)
CREATININE: 0.93 mg/dL (ref 0.40–1.50)
GFR: 81.63 mL/min (ref >60.00)
Glucose, Bld: 136 mg/dL — ABNORMAL HIGH (ref 70–99)
Potassium: 4.6 mEq/L (ref 3.5–5.1)
SODIUM: 141 meq/L (ref 135–145)

## 2014-10-17 LAB — PROTIME-INR
INR: 1.1 ratio — ABNORMAL HIGH (ref 0.8–1.0)
Prothrombin Time: 11.9 s (ref 9.6–13.1)

## 2014-10-17 NOTE — Assessment & Plan Note (Signed)
Continue treatment with atorvastatin with a target LDL of less than 70. 

## 2014-10-17 NOTE — Patient Instructions (Signed)
Medication Instructions:  Your physician recommends that you continue on your current medications as directed. Please refer to the Current Medication list given to you today.     Labwork: Lab work to be done today--BMP, CBC, PT  Testing/Procedures: Your physician has requested that you have a peripheral vascular angiogram. This exam is performed at the hospital. During this exam IV contrast is used to look at arterial blood flow. Please review the information sheet given for details.  Scheduled for October 25, 2014   Follow-Up:  To be set up after procedure.

## 2014-10-17 NOTE — Progress Notes (Signed)
Primary care physician: Dr. Jenny Reichmann  HPI  This is a pleasant 81 year old male who was referred for evaluation of peripheral arterial disease and claudication. He has reported history of coronary artery disease with no available details, prolonged history of diabetes, hyperlipidemia, hypertension and arthritis. He has no history of tobacco use. Over the last year, he has experienced progressive bilateral leg weakness and giving out when he walks. This is worse on the left side than the right side. This has caused him to cut down on his physical activities and he is only able to walk around the house. He has no rest pain and no lower extremity ulceration. He underwent noninvasive evaluation which showed an ABI of 0.93 on the right and 0.31 on the left. Duplex showed moderate bilateral SFA disease, occluded left popliteal artery with severe tibial calcifications.  Allergies  Allergen Reactions  . Levaquin [Levofloxacin In D5w] Nausea Only  . Tramadol Other (See Comments)    somnolence     Current Outpatient Prescriptions on File Prior to Visit  Medication Sig Dispense Refill  . acetaminophen-codeine (TYLENOL #3) 300-30 MG per tablet Take 1 tablet by mouth every 6 (six) hours as needed for moderate pain. 60 tablet 0  . aspirin 81 MG tablet Take 81 mg by mouth daily.    Marland Kitchen atorvastatin (LIPITOR) 20 MG tablet TAKE 1 TABLET (20 MG TOTAL) BY MOUTH DAILY. 90 tablet 1  . bromocriptine (PARLODEL) 2.5 MG tablet Take 0.5 tablets (1.25 mg total) by mouth at bedtime. 45 tablet 2  . canagliflozin (INVOKANA) 100 MG TABS tablet Take 1 tablet (100 mg total) by mouth daily. 30 tablet 11  . clobetasol (TEMOVATE) 0.05 % external solution Apply topically 2 (two) times daily. 50 mL 1  . fosinopril (MONOPRIL) 20 MG tablet TAKE 1 TABLET (20 MG TOTAL) BY MOUTH DAILY. 90 tablet 2  . glucose blood (ONETOUCH VERIO) test strip 1 each by Other route daily. And lancets 1/day 250.00 100 each 12  . isosorbide mononitrate (IMDUR)  60 MG 24 hr tablet Take 0.5 tablets (30 mg total) by mouth daily. 45 tablet 3  . JANUVIA 100 MG tablet TAKE 1 TABLET BY MOUTH EVERY DAY 90 tablet 1  . ketoconazole (NIZORAL) 2 % cream Apply 1 application topically daily. 60 g 0  . metFORMIN (GLUCOPHAGE) 500 MG tablet TAKE 2 TABLETS IN THE MORNING,1 TABLET AT NOON AND 2 TABLETS IN THE EVENING 450 tablet 3  . metoprolol (LOPRESSOR) 50 MG tablet Take 1 tablet (50 mg total) by mouth daily. 90 tablet 1  . repaglinide (PRANDIN) 1 MG tablet Take 1 tablet (1 mg total) by mouth 3 (three) times daily before meals. 90 tablet 11   No current facility-administered medications on file prior to visit.     Past Medical History  Diagnosis Date  . Arthritis   . Diabetes mellitus   . Diverticulitis   . Glaucoma   . Hypertension   . Hyperlipidemia   . Hernia   . History of colonic diverticulitis 09/04/2010  . Anemia 09/04/2010  . DDD (degenerative disc disease), cervical 09/04/2010  . Diastolic dysfunction 1/61/0960  . Psoriasis 09/04/2010  . BPH (benign prostatic hyperplasia) 09/09/2010  . CAD (coronary artery disease)      Past Surgical History  Procedure Laterality Date  . Tonsillectomy  1952  . Broken legs  1981 and 1995  . S/p lipoma  2003     Family History  Problem Relation Age of Onset  . Arthritis Mother   .  Heart disease Mother   . Hypertension Mother   . Arthritis Father   . Heart disease Father   . Hypertension Father   . Cancer Other     lung cancer     History   Social History  . Marital Status: Single    Spouse Name: N/A  . Number of Children: N/A  . Years of Education: N/A   Occupational History  . retired    Social History Main Topics  . Smoking status: Never Smoker   . Smokeless tobacco: Not on file  . Alcohol Use: No  . Drug Use: No  . Sexual Activity: Not on file   Other Topics Concern  . Not on file   Social History Narrative     ROS A 10 point review of system was performed. It is negative  other than that mentioned in the history of present illness.   PHYSICAL EXAM   BP 190/78 mmHg  Pulse 70  Ht 5\' 5"  (1.651 m)  Wt 155 lb 6.4 oz (70.489 kg)  BMI 25.86 kg/m2 Constitutional: He is oriented to person, place, and time. He appears well-developed and well-nourished. No distress.  HENT: No nasal discharge.  Head: Normocephalic and atraumatic.  Eyes: Pupils are equal and round.  No discharge. Neck: Normal range of motion. Neck supple. No JVD present. No thyromegaly present.  Cardiovascular: Normal rate, regular rhythm, normal heart sounds. Exam reveals no gallop and no friction rub. No murmur heard.  Pulmonary/Chest: Effort normal and breath sounds normal. No stridor. No respiratory distress. He has no wheezes. He has no rales. He exhibits no tenderness.  Abdominal: Soft. Bowel sounds are normal. He exhibits no distension. There is no tenderness. There is no rebound and no guarding.  Musculoskeletal: Normal range of motion. He exhibits no edema and no tenderness.  Neurological: He is alert and oriented to person, place, and time. Coordination normal.  Skin: Skin is warm and dry. No rash noted. He is not diaphoretic. No erythema. No pallor.  Psychiatric: He has a normal mood and affect. His behavior is normal. Judgment and thought content normal.   Vascular: Femoral pulses are normal bilaterally. Distal pulses are not palpable.     EKG: Normal sinus rhythm with T-wave changes suggestive of inferolateral ischemia.   ASSESSMENT AND PLAN

## 2014-10-17 NOTE — Assessment & Plan Note (Signed)
He denies any anginal symptoms.

## 2014-10-17 NOTE — Assessment & Plan Note (Signed)
The patient has severe left leg claudication and less degree right leg claudication. This is due to femoropopliteal disease and likely tibial disease. Obviously with his age, there are probably other factors affecting his poor functional capacity and limitations with physical activities. This was discussed with the patient and his daughter. I discussed different management options including medical therapy versus angiography and possible endovascular intervention. Given the severity of his symptoms, will proceed with the second option. I discussed risks and benefits. I also explained to them that there might be no easy options for endovascular intervention.

## 2014-10-17 NOTE — Assessment & Plan Note (Signed)
Blood pressure is elevated but he did not take his medications today.

## 2014-10-18 ENCOUNTER — Other Ambulatory Visit: Payer: Self-pay | Admitting: Endocrinology

## 2014-10-20 ENCOUNTER — Telehealth: Payer: Self-pay | Admitting: Cardiovascular Disease

## 2014-10-20 NOTE — Telephone Encounter (Signed)
Called daughter Joseph Art back and told her that Dr. Fletcher Anon said to have him continue using topical cream and hopefully the area will be better by the time of the procedure

## 2014-10-20 NOTE — Telephone Encounter (Signed)
New Message  PT daughter calling to speak w/ RN about procedure sched for Wednesday 7/20. Please call back and discuss.

## 2014-10-20 NOTE — Telephone Encounter (Signed)
Patient told daughter Joseph Art that he wanted to let the Dr. Gwyndolyn Kaufman that he has a yeast infection that has developed in his groin.  Has history of yeast infections in his groin because of medications he takes.  Is putting a topical cream on the area that he has had from previous yeast infections.

## 2014-10-20 NOTE — Telephone Encounter (Signed)
Continue to use the topical cream. Hopefully, the area will be better by the time of procedure.

## 2014-10-23 ENCOUNTER — Encounter (HOSPITAL_COMMUNITY): Payer: Self-pay | Admitting: Pharmacy Technician

## 2014-10-24 ENCOUNTER — Other Ambulatory Visit (INDEPENDENT_AMBULATORY_CARE_PROVIDER_SITE_OTHER): Payer: Medicare Other

## 2014-10-24 DIAGNOSIS — E1159 Type 2 diabetes mellitus with other circulatory complications: Secondary | ICD-10-CM

## 2014-10-24 DIAGNOSIS — E875 Hyperkalemia: Secondary | ICD-10-CM | POA: Diagnosis not present

## 2014-10-24 LAB — HEMOGLOBIN A1C: HEMOGLOBIN A1C: 7.5 % — AB (ref 4.6–6.5)

## 2014-10-24 LAB — BASIC METABOLIC PANEL
BUN: 22 mg/dL (ref 6–23)
CO2: 30 mEq/L (ref 19–32)
Calcium: 9.6 mg/dL (ref 8.4–10.5)
Chloride: 102 mEq/L (ref 96–112)
Creatinine, Ser: 0.91 mg/dL (ref 0.40–1.50)
GFR: 83.7 mL/min (ref >60.00)
Glucose, Bld: 143 mg/dL — ABNORMAL HIGH (ref 70–99)
Potassium: 4.6 mEq/L (ref 3.5–5.1)
SODIUM: 139 meq/L (ref 135–145)

## 2014-10-25 ENCOUNTER — Other Ambulatory Visit: Payer: Self-pay

## 2014-10-25 ENCOUNTER — Encounter (HOSPITAL_COMMUNITY)
Admission: RE | Disposition: A | Discharge status: Home / Self Care | Payer: Self-pay | Source: Ambulatory Visit | Attending: Cardiovascular Disease

## 2014-10-25 ENCOUNTER — Ambulatory Visit (HOSPITAL_COMMUNITY)
Admission: RE | Admit: 2014-10-25 | Discharge: 2014-10-25 | Disposition: A | Discharge status: Home / Self Care | Payer: Medicare Other | Source: Ambulatory Visit | Attending: Cardiovascular Disease | Admitting: Cardiovascular Disease

## 2014-10-25 DIAGNOSIS — M1990 Unspecified osteoarthritis, unspecified site: Secondary | ICD-10-CM | POA: Insufficient documentation

## 2014-10-25 DIAGNOSIS — I739 Peripheral vascular disease, unspecified: Secondary | ICD-10-CM

## 2014-10-25 DIAGNOSIS — Z7982 Long term (current) use of aspirin: Secondary | ICD-10-CM | POA: Insufficient documentation

## 2014-10-25 DIAGNOSIS — N400 Enlarged prostate without lower urinary tract symptoms: Secondary | ICD-10-CM | POA: Insufficient documentation

## 2014-10-25 DIAGNOSIS — I2510 Atherosclerotic heart disease of native coronary artery without angina pectoris: Secondary | ICD-10-CM | POA: Diagnosis not present

## 2014-10-25 DIAGNOSIS — I10 Essential (primary) hypertension: Secondary | ICD-10-CM | POA: Insufficient documentation

## 2014-10-25 DIAGNOSIS — I70213 Atherosclerosis of native arteries of extremities with intermittent claudication, bilateral legs: Secondary | ICD-10-CM | POA: Diagnosis not present

## 2014-10-25 DIAGNOSIS — I70212 Atherosclerosis of native arteries of extremities with intermittent claudication, left leg: Secondary | ICD-10-CM | POA: Diagnosis not present

## 2014-10-25 DIAGNOSIS — E119 Type 2 diabetes mellitus without complications: Secondary | ICD-10-CM | POA: Insufficient documentation

## 2014-10-25 DIAGNOSIS — I701 Atherosclerosis of renal artery: Secondary | ICD-10-CM | POA: Diagnosis not present

## 2014-10-25 DIAGNOSIS — D649 Anemia, unspecified: Secondary | ICD-10-CM | POA: Insufficient documentation

## 2014-10-25 DIAGNOSIS — L409 Psoriasis, unspecified: Secondary | ICD-10-CM | POA: Insufficient documentation

## 2014-10-25 DIAGNOSIS — E785 Hyperlipidemia, unspecified: Secondary | ICD-10-CM | POA: Insufficient documentation

## 2014-10-25 HISTORY — PX: PERIPHERAL VASCULAR CATHETERIZATION: SHX172C

## 2014-10-25 LAB — GLUCOSE, CAPILLARY: GLUCOSE-CAPILLARY: 154 mg/dL — AB (ref 65–99)

## 2014-10-25 SURGERY — ABDOMINAL AORTOGRAM
Anesthesia: LOCAL

## 2014-10-25 MED ORDER — ASPIRIN 81 MG PO CHEW: 81.0000 mg | CHEWABLE_TABLET | ORAL | Status: DC

## 2014-10-25 MED ORDER — LABETALOL HCL 5 MG/ML IV SOLN
INTRAVENOUS | Status: DC | PRN
  Administered 2014-10-25: 20 mg via INTRAVENOUS

## 2014-10-25 MED ORDER — SODIUM CHLORIDE 0.9 % IJ SOLN: 3.0000 mL | INTRAMUSCULAR | Status: DC | PRN

## 2014-10-25 MED ORDER — SODIUM CHLORIDE 0.9 % WEIGHT BASED INFUSION
1.0000 mL/kg/h | INTRAVENOUS | Status: DC
Start: 2014-10-26 — End: 2014-10-25
  Administered 2014-10-25: 1 mL/kg/h via INTRAVENOUS

## 2014-10-25 MED ORDER — IODIXANOL 320 MG/ML IV SOLN
INTRAVENOUS | Status: DC | PRN
  Administered 2014-10-25: 157 mL via INTRAVENOUS

## 2014-10-25 MED ORDER — HEPARIN (PORCINE) IN NACL 2-0.9 UNIT/ML-% IJ SOLN
INTRAMUSCULAR | Status: AC
  Filled 2014-10-25: qty 1000

## 2014-10-25 MED ORDER — SODIUM CHLORIDE 0.9 % IJ SOLN: 3.0000 mL | Freq: Two times a day (BID) | INTRAMUSCULAR | Status: DC

## 2014-10-25 MED ORDER — LABETALOL HCL 5 MG/ML IV SOLN
INTRAVENOUS | Status: AC
  Filled 2014-10-25: qty 4

## 2014-10-25 MED ORDER — CILOSTAZOL 50 MG PO TABS: 50.0000 mg | ORAL_TABLET | Freq: Two times a day (BID) | ORAL | Status: DC

## 2014-10-25 MED ORDER — SODIUM CHLORIDE 0.9 % WEIGHT BASED INFUSION
3.0000 mL/kg/h | INTRAVENOUS | Status: DC
Start: 2014-10-26 — End: 2014-10-25
  Administered 2014-10-25: 3 mL/kg/h via INTRAVENOUS

## 2014-10-25 MED ORDER — SODIUM CHLORIDE 0.9 % IV SOLN
250.0000 mL | INTRAVENOUS | Status: DC | PRN
Start: 2014-10-25 — End: 2014-10-25

## 2014-10-25 MED ORDER — SODIUM CHLORIDE 0.9 % IV SOLN: 250.0000 mL | INTRAVENOUS | Status: DC | PRN

## 2014-10-25 MED ORDER — SODIUM CHLORIDE 0.9 % IV SOLN: INTRAVENOUS | Status: AC

## 2014-10-25 MED ORDER — LIDOCAINE HCL (PF) 1 % IJ SOLN
INTRAMUSCULAR | Status: DC | PRN
  Administered 2014-10-25: 11:00:00

## 2014-10-25 MED ORDER — LIDOCAINE HCL (PF) 1 % IJ SOLN
INTRAMUSCULAR | Status: AC
Start: 2014-10-25 — End: 2014-10-25
  Filled 2014-10-25: qty 30

## 2014-10-25 SURGICAL SUPPLY — 8 items
CATH ANGIO 5F PIGTAIL 65CM (CATHETERS) ×3 IMPLANT
KIT PV (KITS) ×3 IMPLANT
SHEATH PINNACLE 5F 10CM (SHEATH) ×3 IMPLANT
SYR MEDRAD MARK V 150ML (SYRINGE) ×3 IMPLANT
TRANSDUCER W/STOPCOCK (MISCELLANEOUS) ×3 IMPLANT
TRAY PV CATH (CUSTOM PROCEDURE TRAY) ×3 IMPLANT
TUBING HIGH PRESSURE 120CM (CONNECTOR) ×6 IMPLANT
WIRE HITORQ VERSACORE ST 145CM (WIRE) ×3 IMPLANT

## 2014-10-25 NOTE — H&P (View-Only) (Signed)
Primary care physician: Dr. Jenny Reichmann  HPI  This is a pleasant 81 year old male who was referred for evaluation of peripheral arterial disease and claudication. He has reported history of coronary artery disease with no available details, prolonged history of diabetes, hyperlipidemia, hypertension and arthritis. He has no history of tobacco use. Over the last year, he has experienced progressive bilateral leg weakness and giving out when he walks. This is worse on the left side than the right side. This has caused him to cut down on his physical activities and he is only able to walk around the house. He has no rest pain and no lower extremity ulceration. He underwent noninvasive evaluation which showed an ABI of 0.93 on the right and 0.31 on the left. Duplex showed moderate bilateral SFA disease, occluded left popliteal artery with severe tibial calcifications.  Allergies  Allergen Reactions  . Levaquin [Levofloxacin In D5w] Nausea Only  . Tramadol Other (See Comments)    somnolence     Current Outpatient Prescriptions on File Prior to Visit  Medication Sig Dispense Refill  . acetaminophen-codeine (TYLENOL #3) 300-30 MG per tablet Take 1 tablet by mouth every 6 (six) hours as needed for moderate pain. 60 tablet 0  . aspirin 81 MG tablet Take 81 mg by mouth daily.    Marland Kitchen atorvastatin (LIPITOR) 20 MG tablet TAKE 1 TABLET (20 MG TOTAL) BY MOUTH DAILY. 90 tablet 1  . bromocriptine (PARLODEL) 2.5 MG tablet Take 0.5 tablets (1.25 mg total) by mouth at bedtime. 45 tablet 2  . canagliflozin (INVOKANA) 100 MG TABS tablet Take 1 tablet (100 mg total) by mouth daily. 30 tablet 11  . clobetasol (TEMOVATE) 0.05 % external solution Apply topically 2 (two) times daily. 50 mL 1  . fosinopril (MONOPRIL) 20 MG tablet TAKE 1 TABLET (20 MG TOTAL) BY MOUTH DAILY. 90 tablet 2  . glucose blood (ONETOUCH VERIO) test strip 1 each by Other route daily. And lancets 1/day 250.00 100 each 12  . isosorbide mononitrate (IMDUR)  60 MG 24 hr tablet Take 0.5 tablets (30 mg total) by mouth daily. 45 tablet 3  . JANUVIA 100 MG tablet TAKE 1 TABLET BY MOUTH EVERY DAY 90 tablet 1  . ketoconazole (NIZORAL) 2 % cream Apply 1 application topically daily. 60 g 0  . metFORMIN (GLUCOPHAGE) 500 MG tablet TAKE 2 TABLETS IN THE MORNING,1 TABLET AT NOON AND 2 TABLETS IN THE EVENING 450 tablet 3  . metoprolol (LOPRESSOR) 50 MG tablet Take 1 tablet (50 mg total) by mouth daily. 90 tablet 1  . repaglinide (PRANDIN) 1 MG tablet Take 1 tablet (1 mg total) by mouth 3 (three) times daily before meals. 90 tablet 11   No current facility-administered medications on file prior to visit.     Past Medical History  Diagnosis Date  . Arthritis   . Diabetes mellitus   . Diverticulitis   . Glaucoma   . Hypertension   . Hyperlipidemia   . Hernia   . History of colonic diverticulitis 09/04/2010  . Anemia 09/04/2010  . DDD (degenerative disc disease), cervical 09/04/2010  . Diastolic dysfunction 0/17/7939  . Psoriasis 09/04/2010  . BPH (benign prostatic hyperplasia) 09/09/2010  . CAD (coronary artery disease)      Past Surgical History  Procedure Laterality Date  . Tonsillectomy  1952  . Broken legs  1981 and 1995  . S/p lipoma  2003     Family History  Problem Relation Age of Onset  . Arthritis Mother   .  Heart disease Mother   . Hypertension Mother   . Arthritis Father   . Heart disease Father   . Hypertension Father   . Cancer Other     lung cancer     History   Social History  . Marital Status: Single    Spouse Name: N/A  . Number of Children: N/A  . Years of Education: N/A   Occupational History  . retired    Social History Main Topics  . Smoking status: Never Smoker   . Smokeless tobacco: Not on file  . Alcohol Use: No  . Drug Use: No  . Sexual Activity: Not on file   Other Topics Concern  . Not on file   Social History Narrative     ROS A 10 point review of system was performed. It is negative  other than that mentioned in the history of present illness.   PHYSICAL EXAM   BP 190/78 mmHg  Pulse 70  Ht 5\' 5"  (1.651 m)  Wt 155 lb 6.4 oz (70.489 kg)  BMI 25.86 kg/m2 Constitutional: He is oriented to person, place, and time. He appears well-developed and well-nourished. No distress.  HENT: No nasal discharge.  Head: Normocephalic and atraumatic.  Eyes: Pupils are equal and round.  No discharge. Neck: Normal range of motion. Neck supple. No JVD present. No thyromegaly present.  Cardiovascular: Normal rate, regular rhythm, normal heart sounds. Exam reveals no gallop and no friction rub. No murmur heard.  Pulmonary/Chest: Effort normal and breath sounds normal. No stridor. No respiratory distress. He has no wheezes. He has no rales. He exhibits no tenderness.  Abdominal: Soft. Bowel sounds are normal. He exhibits no distension. There is no tenderness. There is no rebound and no guarding.  Musculoskeletal: Normal range of motion. He exhibits no edema and no tenderness.  Neurological: He is alert and oriented to person, place, and time. Coordination normal.  Skin: Skin is warm and dry. No rash noted. He is not diaphoretic. No erythema. No pallor.  Psychiatric: He has a normal mood and affect. His behavior is normal. Judgment and thought content normal.   Vascular: Femoral pulses are normal bilaterally. Distal pulses are not palpable.     EKG: Normal sinus rhythm with T-wave changes suggestive of inferolateral ischemia.   ASSESSMENT AND PLAN

## 2014-10-25 NOTE — Interval H&P Note (Signed)
History and Physical Interval Note:  10/25/2014 10:43 AM  Jeffrey Kaiser  has presented today for surgery, with the diagnosis of pvd  The various methods of treatment have been discussed with the patient and family. After consideration of risks, benefits and other options for treatment, the patient has consented to  Procedure(s): Abdominal Aortogram (N/A) as a surgical intervention .  The patient's history has been reviewed, patient examined, no change in status, stable for surgery.  I have reviewed the patient's chart and labs.  Questions were answered to the patient's satisfaction.     [name]

## 2014-10-25 NOTE — Discharge Instructions (Signed)

## 2014-10-25 NOTE — Research (Signed)
SAFE Informed Consent   Subject Name: Jeffrey Kaiser  Subject met inclusion and exclusion criteria.  The informed consent form, study requirements and expectations were reviewed with the subject and questions and concerns were addressed prior to the signing of the consent form.  The subject verbalized understanding of the trail requirements.  The subject agreed to participate in the SAFE trial and signed the informed consent.  The informed consent was obtained prior to performance of any protocol-specific procedures for the subject.  A copy of the signed informed consent was given to the subject and a copy was placed in the subject's medical record.  Sandie Ano 10/25/2014, 9:02 AM

## 2014-10-25 NOTE — Progress Notes (Signed)
Area to right leg has bruising that has moved from area marked.  Area is soft but dark bruised area spread about four and a half inches and less than an inch wide following the crease of the upper leg where leg mets torso below marked area above crease where original plum sized hematoma formed in holding area before coming to short stay post proceedure.  Dr. Fletcher Anon notified.  Will continue to monitor area.

## 2014-10-26 ENCOUNTER — Telehealth: Payer: Self-pay | Admitting: Cardiovascular Disease

## 2014-10-26 ENCOUNTER — Other Ambulatory Visit: Payer: Self-pay

## 2014-10-26 ENCOUNTER — Encounter (HOSPITAL_COMMUNITY): Payer: Self-pay | Admitting: Cardiovascular Disease

## 2014-10-26 MED ORDER — CILOSTAZOL 50 MG PO TABS: 50.0000 mg | ORAL_TABLET | Freq: Two times a day (BID) | ORAL | Status: DC

## 2014-10-26 NOTE — Telephone Encounter (Signed)
Follow Up ° °Pt returned call//  °

## 2014-10-26 NOTE — Telephone Encounter (Signed)
I spoke with the pt's daughter and made her aware of post hospital appointment.  I also advised her that a Rx for pletal was sent to the pharmacy yesterday.  The pt does use 2 different pharmacies and his Rx was [facility] to [facility] in [place].  She asked that I [facility] the [facility] on [address].  Rx resent.

## 2014-10-27 ENCOUNTER — Telehealth: Payer: Self-pay | Admitting: *Deleted

## 2014-10-27 NOTE — Telephone Encounter (Signed)
Pt  daughter calling stating we just did a procedure and now  Where we did the procedure, pt leg area looks a bit bruised She is concerned about this and would like to know what to do about this  Please advise.

## 2014-10-27 NOTE — Telephone Encounter (Signed)
Procedure in [place]. Will forward to [facility]. triage

## 2014-10-27 NOTE — Telephone Encounter (Signed)
I spoke with Jeffrey Kaiser the pt's daughter and she is concerned about the pt's bruising at groin site.  Jeffrey Kaiser did not look at the pt's bruise so I asked if I could contact the pt.   Pt [id number].  I spoke with the pt and he did have bruising at his right groin site when he left the hospital.  The pt said the bruising is a little bigger and he would say it measures about 4 inches.  The pt cannot really describe the location of the bruise.  The bruise is a dark purple. Pt denies hardness at area and no pain. The pt denies any numbness or pain in right leg.  The pt feels like his groin site is fine.  I advised the pt to continue to monitor his groin site.  If he develops swelling, bleeding or pain at site he will contact the office.  I called Jeffrey Kaiser and made her aware that at this time the pt will continue to monitor his groin site.  She is aware that the pt needs to contact our office if he develops swelling, bleeding or pain at cath site.

## 2014-10-29 ENCOUNTER — Telehealth: Payer: Self-pay | Admitting: Physician Assistant

## 2014-10-29 NOTE — Telephone Encounter (Signed)
Jeffrey Kaiser is a 81 y.o. male with a history of PAD. He is status post recent peripheral angiogram. He has significant left popliteal artery stenosis. Conservative management with medical therapy and walking program was recommended. The patient called the answering service today with complaints of left lower extremity discomfort. This was mild and rated at 2 out of 10. He denies any significant change in his symptoms. Right groin site is stable without change in bruising or pain. I have advised him to continue as recommended by Dr. Fletcher Anon. We went over the warning signs of acute arterial embolism. Richardson Dopp, PA-C   10/29/2014 3:50 PM

## 2014-10-31 ENCOUNTER — Ambulatory Visit (INDEPENDENT_AMBULATORY_CARE_PROVIDER_SITE_OTHER): Payer: Medicare Other | Admitting: Internal Medicine

## 2014-10-31 ENCOUNTER — Encounter: Payer: Self-pay | Admitting: Internal Medicine

## 2014-10-31 VITALS — BP 130/76 | HR 61 | Temp 98.0°F | Resp 16 | Ht 65.0 in | Wt 158.5 lb

## 2014-10-31 DIAGNOSIS — I2510 Atherosclerotic heart disease of native coronary artery without angina pectoris: Secondary | ICD-10-CM | POA: Diagnosis not present

## 2014-10-31 DIAGNOSIS — B356 Tinea cruris: Secondary | ICD-10-CM | POA: Insufficient documentation

## 2014-10-31 MED ORDER — CICLOPIROX 0.77 % EX GEL: 1.0000 | Freq: Two times a day (BID) | CUTANEOUS | Status: DC

## 2014-10-31 NOTE — Patient Instructions (Signed)

## 2014-10-31 NOTE — Progress Notes (Signed)
Subjective:  Patient ID: Jeffrey Kaiser, male    DOB: [birth year]  Age: 81 y.o. MRN: [id number]  CC: Rash   HPI Jeffrey Kaiser presents for persistent groin rash. He has been diagnosed with tinea cruris and phimosis and has been trying ketoconazole cream. He doesn't feel like the rash is resolving. He recently had a vascular procedure and wants me to check out bruising in his right groin as well. He has no pain or swelling in the area and he feels like the bruising is resolving.  Outpatient Prescriptions Prior to Visit  Medication Sig Dispense Refill  . acetaminophen-codeine (TYLENOL #3) 300-30 MG per tablet Take 1 tablet by mouth every 6 (six) hours as needed for moderate pain. 60 tablet 0  . aspirin 81 MG tablet Take 81 mg by mouth daily.    Marland Kitchen atorvastatin (LIPITOR) 20 MG tablet TAKE 1 TABLET (20 MG TOTAL) BY MOUTH DAILY. 90 tablet 1  . bromocriptine (PARLODEL) 2.5 MG tablet Take 0.5 tablets (1.25 mg total) by mouth at bedtime. 45 tablet 2  . canagliflozin (INVOKANA) 100 MG TABS tablet Take 1 tablet (100 mg total) by mouth daily. 30 tablet 11  . cilostazol (PLETAL) 50 MG tablet Take 1 tablet (50 mg total) by mouth 2 (two) times daily. 60 tablet 3  . clobetasol (TEMOVATE) 0.05 % external solution Apply topically 2 (two) times daily. 50 mL 1  . fosinopril (MONOPRIL) 20 MG tablet TAKE 1 TABLET (20 MG TOTAL) BY MOUTH DAILY. 90 tablet 2  . glucose blood (ONETOUCH VERIO) test strip 1 each by Other route daily. And lancets 1/day 250.00 100 each 12  . isosorbide mononitrate (IMDUR) 60 MG 24 hr tablet Take 0.5 tablets (30 mg total) by mouth daily. 45 tablet 3  . JANUVIA 100 MG tablet TAKE 1 TABLET BY MOUTH EVERY DAY 90 tablet 1  . metoprolol (LOPRESSOR) 50 MG tablet Take 1 tablet (50 mg total) by mouth daily. 90 tablet 1  . repaglinide (PRANDIN) 1 MG tablet TAKE 1 TABLET (1 MG TOTAL) BY MOUTH 3 (THREE) TIMES DAILY BEFORE MEALS. 90 tablet 3  . ketoconazole (NIZORAL) 2 % cream Apply 1 application  topically daily. 60 g 0   No facility-administered medications prior to visit.    ROS Review of Systems  Constitutional: Negative.  Negative for fever, chills, diaphoresis, appetite change and fatigue.  HENT: Negative.   Eyes: Negative.   Respiratory: Negative.  Negative for cough, choking, chest tightness, shortness of breath and stridor.   Cardiovascular: Negative.  Negative for chest pain, palpitations and leg swelling.  Gastrointestinal: Negative.  Negative for abdominal pain.  Endocrine: Negative.   Genitourinary: Negative.  Negative for urgency, penile swelling, scrotal swelling, difficulty urinating and testicular pain.  Musculoskeletal: Negative.  Negative for myalgias, back pain and arthralgias.  Skin: Positive for rash.  Allergic/Immunologic: Negative.   Neurological: Negative.  Negative for dizziness, light-headedness and headaches.  Hematological: Negative.  Negative for adenopathy. Does not bruise/bleed easily.  Psychiatric/Behavioral: Negative.     Objective:  BP 130/76 mmHg  Pulse 61  Temp(Src) 98 F (36.7 C) (Oral)  Resp 16  Ht 5\' 5"  (1.651 m)  Wt 158 lb 8 oz (71.895 kg)  BMI 26.38 kg/m2  SpO2 97%  BP Readings from Last 3 Encounters:  10/31/14 130/76  10/25/14 154/61  10/17/14 190/78    Wt Readings from Last 3 Encounters:  10/31/14 158 lb 8 oz (71.895 kg)  10/25/14 155 lb (70.308 kg)  10/17/14 155  lb 6.4 oz (70.489 kg)    Physical Exam  Constitutional: He is oriented to person, place, and time. No distress.  HENT:  Mouth/Throat: Oropharynx is clear and moist. No oropharyngeal exudate.  Eyes: Conjunctivae are normal. Right eye exhibits no discharge. Left eye exhibits no discharge. No scleral icterus.  Neck: Normal range of motion. No JVD present. No tracheal deviation present. No thyromegaly present.  Cardiovascular: Normal rate, regular rhythm, normal heart sounds and intact distal pulses.  Exam reveals no gallop and no friction rub.   No murmur  heard. Pulses:      Femoral pulses are 1+ on the right side, and 1+ on the left side. Pulmonary/Chest: Effort normal and breath sounds normal. No stridor. No respiratory distress. He has no wheezes. He has no rales. He exhibits no tenderness.  Abdominal: Soft. Bowel sounds are normal. He exhibits no distension. There is no tenderness. There is no rebound and no guarding. Hernia confirmed negative in the right inguinal area and confirmed negative in the left inguinal area.  Genitourinary: Penis normal.    Right testis shows no mass, no swelling and no tenderness. Right testis is descended. Left testis shows no swelling and no tenderness. Left testis is descended. Uncircumcised. No phimosis, paraphimosis, hypospadias, penile erythema or penile tenderness. No discharge found.  Musculoskeletal: Normal range of motion. He exhibits no edema.  Lymphadenopathy:    He has no cervical adenopathy.       Right: No inguinal adenopathy present.       Left: No inguinal adenopathy present.  Neurological: He is oriented to person, place, and time.  Skin: Rash noted. He is not diaphoretic.  Vitals reviewed.   Lab Results  Component Value Date   WBC 9.0 10/17/2014   HGB 12.7* 10/17/2014   HCT 38.4* 10/17/2014   PLT 159.0 10/17/2014   GLUCOSE 143* 10/24/2014   CHOL 150 10/05/2014   TRIG 121.0 10/05/2014   HDL 37.20* 10/05/2014   LDLDIRECT 114.4 05/13/2012   LDLCALC 89 10/05/2014   ALT 13 10/05/2014   AST 18 10/05/2014   NA 139 10/24/2014   K 4.6 10/24/2014   CL 102 10/24/2014   CREATININE 0.91 10/24/2014   BUN 22 10/24/2014   CO2 30 10/24/2014   TSH 4.74* 10/05/2014   INR 1.1* 10/17/2014   HGBA1C 7.5* 10/24/2014   MICROALBUR 1.5 10/05/2014    No results found.  Assessment & Plan:   Jeffrey Kaiser was seen today for rash.  Diagnoses and all orders for this visit:  Tinea cruris- he appears to have a tinea cruris that is resistant to ketoconazole, will try ciclopriox. The bruising from his  recent vascular procedure is resolving and I do not see any evidence of concern or complications. Orders: -     Ciclopirox 0.77 % gel; Apply 1 Act topically 2 (two) times daily.   I have discontinued Jeffrey Kaiser ketoconazole. I am also having him start on Ciclopirox. Additionally, I am having him maintain his aspirin, glucose blood, acetaminophen-codeine, JANUVIA, fosinopril, atorvastatin, bromocriptine, metoprolol, isosorbide mononitrate, clobetasol, canagliflozin, repaglinide, and cilostazol.  Meds ordered this encounter  Medications  . Ciclopirox 0.77 % gel    Sig: Apply 1 Act topically 2 (two) times daily.    Dispense:  100 g    Refill:  2     Follow-up: Return in about 4 weeks (around 11/28/2014).  Scarlette Calico, MD

## 2014-10-31 NOTE — Progress Notes (Signed)
Pre visit review using our clinic review tool, if applicable. No additional management support is needed unless otherwise documented below in the visit note. 

## 2014-11-02 ENCOUNTER — Ambulatory Visit: Payer: Medicare Other | Admitting: Internal Medicine

## 2014-11-14 ENCOUNTER — Encounter: Payer: Self-pay | Admitting: Cardiovascular Disease

## 2014-11-14 ENCOUNTER — Ambulatory Visit (INDEPENDENT_AMBULATORY_CARE_PROVIDER_SITE_OTHER): Payer: Medicare Other | Admitting: Cardiovascular Disease

## 2014-11-14 VITALS — BP 182/90 | HR 102 | Ht 65.0 in | Wt 156.8 lb

## 2014-11-14 DIAGNOSIS — I739 Peripheral vascular disease, unspecified: Secondary | ICD-10-CM | POA: Diagnosis not present

## 2014-11-14 DIAGNOSIS — I2510 Atherosclerotic heart disease of native coronary artery without angina pectoris: Secondary | ICD-10-CM

## 2014-11-14 DIAGNOSIS — I10 Essential (primary) hypertension: Secondary | ICD-10-CM | POA: Diagnosis not present

## 2014-11-14 MED ORDER — METOPROLOL SUCCINATE ER 50 MG PO TB24: 50.0000 mg | ORAL_TABLET | Freq: Every day | ORAL | Status: DC

## 2014-11-14 NOTE — Progress Notes (Signed)
Primary care physician: Dr. Jenny Reichmann  HPI  This is a pleasant 81 year old male who is here today for a follow up visit regarding peripheral arterial disease and claudication. He has reported history of coronary artery disease with no available details, prolonged history of diabetes, hyperlipidemia, hypertension and arthritis. He has no history of tobacco use. He was seen recently for severe left calf claudication . He underwent noninvasive evaluation which showed an ABI of 0.93 on the right and 0.31 on the left. Duplex showed moderate bilateral SFA disease, occluded left popliteal artery with severe tibial calcifications. I proceeded with angiography in 7/16 which showed: 1. No significant aortoiliac disease. 2. Moderate distal left common femoral artery stenosis. Mild nonobstructive disease in the left SFA. Subtotal occlusion of the left popliteal artery proximally with 1 vessel runoff below the knee. 3. Mild nonobstructive disease affecting the right SFA and popliteal arteries. 2 vessel runoff below the knee on the right. I started him on small dose cilostazol. He reports significant improvement in claudication. He is noted to be mildly tachycardic today. He has been using metoprolol tartrate once daily only.   Allergies  Allergen Reactions  . Levaquin [Levofloxacin In D5w] Nausea Only  . Tramadol Other (See Comments)    somnolence     Current Outpatient Prescriptions on File Prior to Visit  Medication Sig Dispense Refill  . acetaminophen-codeine (TYLENOL #3) 300-30 MG per tablet Take 1 tablet by mouth every 6 (six) hours as needed for moderate pain. 60 tablet 0  . aspirin 81 MG tablet Take 81 mg by mouth daily.    Marland Kitchen atorvastatin (LIPITOR) 20 MG tablet TAKE 1 TABLET (20 MG TOTAL) BY MOUTH DAILY. 90 tablet 1  . bromocriptine (PARLODEL) 2.5 MG tablet Take 0.5 tablets (1.25 mg total) by mouth at bedtime. 45 tablet 2  . canagliflozin (INVOKANA) 100 MG TABS tablet Take 1 tablet (100 mg total) by  mouth daily. 30 tablet 11  . cilostazol (PLETAL) 50 MG tablet Take 1 tablet (50 mg total) by mouth 2 (two) times daily. 60 tablet 3  . clobetasol (TEMOVATE) 0.05 % external solution Apply topically 2 (two) times daily. 50 mL 1  . fosinopril (MONOPRIL) 20 MG tablet TAKE 1 TABLET (20 MG TOTAL) BY MOUTH DAILY. 90 tablet 2  . glucose blood (ONETOUCH VERIO) test strip 1 each by Other route daily. And lancets 1/day 250.00 100 each 12  . isosorbide mononitrate (IMDUR) 60 MG 24 hr tablet Take 0.5 tablets (30 mg total) by mouth daily. 45 tablet 3  . JANUVIA 100 MG tablet TAKE 1 TABLET BY MOUTH EVERY DAY 90 tablet 1  . repaglinide (PRANDIN) 1 MG tablet TAKE 1 TABLET (1 MG TOTAL) BY MOUTH 3 (THREE) TIMES DAILY BEFORE MEALS. 90 tablet 3   No current facility-administered medications on file prior to visit.     Past Medical History  Diagnosis Date  . Arthritis   . Diabetes mellitus   . Diverticulitis   . Glaucoma   . Hypertension   . Hyperlipidemia   . Hernia   . History of colonic diverticulitis 09/04/2010  . Anemia 09/04/2010  . DDD (degenerative disc disease), cervical 09/04/2010  . Diastolic dysfunction 07/14/8117  . Psoriasis 09/04/2010  . BPH (benign prostatic hyperplasia) 09/09/2010  . CAD (coronary artery disease)      Past Surgical History  Procedure Laterality Date  . Tonsillectomy  1952  . Broken legs  1981 and 1995  . S/p lipoma  2003  . Peripheral vascular catheterization  N/A 10/25/2014    Procedure: Abdominal Aortogram;  Surgeon: Wellington Hampshire, MD;  Location: [facility];  Service: Cardiovascular;  Laterality: N/A;  . Peripheral vascular catheterization Bilateral 10/25/2014    Procedure: Lower Extremity Angiography;  Surgeon: Wellington Hampshire, MD;  Location: [facility];  Service: Cardiovascular;  Laterality: Bilateral;     Family History  Problem Relation Age of Onset  . Arthritis Mother   . Heart disease Mother   . Hypertension Mother   . Arthritis Father     . Heart disease Father   . Hypertension Father   . Cancer Other     lung cancer     Social History   Social History  . Marital Status: Single    Spouse Name: N/A  . Number of Children: N/A  . Years of Education: N/A   Occupational History  . retired    Social History Main Topics  . Smoking status: Never Smoker   . Smokeless tobacco: Not on file  . Alcohol Use: No  . Drug Use: No  . Sexual Activity: Not on file   Other Topics Concern  . Not on file   Social History Narrative     ROS A 10 point review of system was performed. It is negative other than that mentioned in the history of present illness.   PHYSICAL EXAM   BP 182/90 mmHg  Pulse 102  Ht 5\' 5"  (1.651 m)  Wt 156 lb 12.8 oz (71.124 kg)  BMI 26.09 kg/m2 Constitutional: He is oriented to person, place, and time. He appears well-developed and well-nourished. No distress.  HENT: No nasal discharge.  Head: Normocephalic and atraumatic.  Eyes: Pupils are equal and round.  No discharge. Neck: Normal range of motion. Neck supple. No JVD present. No thyromegaly present.  Cardiovascular: Normal rate, regular rhythm, normal heart sounds. Exam reveals no gallop and no friction rub. No murmur heard.  Pulmonary/Chest: Effort normal and breath sounds normal. No stridor. No respiratory distress. He has no wheezes. He has no rales. He exhibits no tenderness.  Abdominal: Soft. Bowel sounds are normal. He exhibits no distension. There is no tenderness. There is no rebound and no guarding.  Musculoskeletal: Normal range of motion. He exhibits no edema and no tenderness.  Neurological: He is alert and oriented to person, place, and time. Coordination normal.  Skin: Skin is warm and dry. No rash noted. He is not diaphoretic. No erythema. No pallor.  Psychiatric: He has a normal mood and affect. His behavior is normal. Judgment and thought content normal.   Vascular: Femoral pulses are normal bilaterally. Distal pulses are  not palpable. No groin hematoma       ASSESSMENT AND PLAN

## 2014-11-14 NOTE — Patient Instructions (Addendum)
Medication Instructions:  Your physician recommends that you schedule a follow-up appointment in:  1) STOP Metoprolol Tartrate (Lopressor) 2) START Metoprolol Succinate (Topol XL) 50mg  tablet by mouth ONCE daily  Labwork: NONE  Testing/Procedures: NONE  Follow-Up: Your physician recommends that you schedule a follow-up appointment in: 3 months with Dr. Fletcher Anon   Any Other Special Instructions Will Be Listed Below (If Applicable).

## 2014-11-18 NOTE — Assessment & Plan Note (Signed)
He has been taking metoprolol tartrate once daily only and thus I switched this to metoprolol succinate.

## 2014-11-18 NOTE — Assessment & Plan Note (Signed)
Recent angiography showed severe subocclusive disease affecting the left popliteal artery with 1 vessel runoff below the knee. There was also moderate disease at distal bifurcation of the left common femoral artery. Endovascular intervention can be done on the left popliteal artery. However, this is going to be associated with significant risk of distal embolization. Given that his symptoms improved with cilostazol, I recommend continuing medical therapy for now and reserving revascularization for more advanced symptoms.

## 2014-12-13 ENCOUNTER — Other Ambulatory Visit: Payer: Self-pay | Admitting: *Deleted

## 2014-12-13 MED ORDER — CILOSTAZOL 50 MG PO TABS: 50.0000 mg | ORAL_TABLET | Freq: Two times a day (BID) | ORAL | Status: DC

## 2015-02-02 ENCOUNTER — Other Ambulatory Visit: Payer: Self-pay

## 2015-02-02 MED ORDER — REPAGLINIDE 1 MG PO TABS: ORAL_TABLET | ORAL | Status: DC

## 2015-02-08 ENCOUNTER — Other Ambulatory Visit: Payer: Self-pay | Admitting: Internal Medicine

## 2015-02-12 ENCOUNTER — Encounter: Payer: Self-pay | Admitting: Endocrinology

## 2015-02-12 ENCOUNTER — Ambulatory Visit (INDEPENDENT_AMBULATORY_CARE_PROVIDER_SITE_OTHER): Payer: Medicare Other | Admitting: Endocrinology

## 2015-02-12 VITALS — BP 132/74 | HR 75 | Temp 98.2°F | Ht 65.0 in | Wt 157.0 lb

## 2015-02-12 DIAGNOSIS — E1159 Type 2 diabetes mellitus with other circulatory complications: Secondary | ICD-10-CM

## 2015-02-12 DIAGNOSIS — I2510 Atherosclerotic heart disease of native coronary artery without angina pectoris: Secondary | ICD-10-CM

## 2015-02-12 LAB — POCT GLYCOSYLATED HEMOGLOBIN (HGB A1C): Hemoglobin A1C: 6.9

## 2015-02-12 NOTE — Patient Instructions (Addendum)
check your blood sugar once a day.  vary the time of day when you check, between before the 3 meals, and at bedtime.  also check if you have symptoms of your blood sugar being too high or too low.  please keep a record of the readings and bring it to your next appointment here.  You can write it on any piece of paper.  please call us sooner if your blood sugar goes below 70, or if you have a lot of readings over 200.  Please come back for a follow-up appointment in 4 months.   Please continue the same medications (you can stay off the invokana).

## 2015-02-12 NOTE — Progress Notes (Signed)
Subjective:    Patient ID: Jeffrey Kaiser, male    DOB: [birth year], 81 y.o.   MRN: [id number]  HPI Pt returns for f/u of diabetes mellitus: DM type: 2 Dx'ed: 0737 Complications: CAD Therapy: 3 oral meds DKA: never. Severe hypoglycemia: never Pancreatitis: never.   Other: he has never been on insulin.  Interval history: no cbg record, but states cbg's are well-controlled.  pt states she feels well in general.  He does not take the invokana, but he does not know why.   Past Medical History  Diagnosis Date  . Arthritis   . Diabetes mellitus   . Diverticulitis   . Glaucoma   . Hypertension   . Hyperlipidemia   . Hernia   . History of colonic diverticulitis 09/04/2010  . Anemia 09/04/2010  . DDD (degenerative disc disease), cervical 09/04/2010  . Diastolic dysfunction 04/12/2692  . Psoriasis 09/04/2010  . BPH (benign prostatic hyperplasia) 09/09/2010  . CAD (coronary artery disease)     Past Surgical History  Procedure Laterality Date  . Tonsillectomy  1952  . Broken legs  1981 and 1995  . S/p lipoma  2003  . Peripheral vascular catheterization N/A 10/25/2014    Procedure: Abdominal Aortogram;  Surgeon: Wellington Hampshire, MD;  Location: [facility];  Service: Cardiovascular;  Laterality: N/A;  . Peripheral vascular catheterization Bilateral 10/25/2014    Procedure: Lower Extremity Angiography;  Surgeon: Wellington Hampshire, MD;  Location: [facility];  Service: Cardiovascular;  Laterality: Bilateral;    Social History   Social History  . Marital Status: Single    Spouse Name: N/A  . Number of Children: N/A  . Years of Education: N/A   Occupational History  . retired    Social History Main Topics  . Smoking status: Never Smoker   . Smokeless tobacco: Not on file  . Alcohol Use: No  . Drug Use: No  . Sexual Activity: Not on file   Other Topics Concern  . Not on file   Social History Narrative    Current Outpatient Prescriptions on File Prior to Visit    Medication Sig Dispense Refill  . acetaminophen-codeine (TYLENOL #3) 300-30 MG per tablet Take 1 tablet by mouth every 6 (six) hours as needed for moderate pain. 60 tablet 0  . aspirin 81 MG tablet Take 81 mg by mouth daily.    Marland Kitchen atorvastatin (LIPITOR) 20 MG tablet TAKE 1 TABLET (20 MG TOTAL) BY MOUTH DAILY. 90 tablet 1  . bromocriptine (PARLODEL) 2.5 MG tablet Take 0.5 tablets (1.25 mg total) by mouth at bedtime. 45 tablet 2  . cilostazol (PLETAL) 50 MG tablet Take 1 tablet (50 mg total) by mouth 2 (two) times daily. 180 tablet 3  . clobetasol (TEMOVATE) 0.05 % external solution Apply topically 2 (two) times daily. 50 mL 1  . fosinopril (MONOPRIL) 20 MG tablet TAKE 1 TABLET (20 MG TOTAL) BY MOUTH DAILY. 90 tablet 2  . glucose blood (ONETOUCH VERIO) test strip 1 each by Other route daily. And lancets 1/day 250.00 100 each 12  . isosorbide mononitrate (IMDUR) 60 MG 24 hr tablet Take 0.5 tablets (30 mg total) by mouth daily. 45 tablet 3  . JANUVIA 100 MG tablet TAKE 1 TABLET BY MOUTH EVERY DAY 90 tablet 1  . metoprolol succinate (TOPROL-XL) 50 MG 24 hr tablet Take 1 tablet (50 mg total) by mouth daily. Take with or immediately following a meal. 90 tablet 3  . repaglinide (PRANDIN)  1 MG tablet TAKE 1 TABLET (1 MG TOTAL) BY MOUTH 3 (THREE) TIMES DAILY BEFORE MEALS. 90 tablet 3   No current facility-administered medications on file prior to visit.    Allergies  Allergen Reactions  . Levaquin [Levofloxacin In D5w] Nausea Only  . Tramadol Other (See Comments)    somnolence    Family History  Problem Relation Age of Onset  . Arthritis Mother   . Heart disease Mother   . Hypertension Mother   . Arthritis Father   . Heart disease Father   . Hypertension Father   . Cancer Other     lung cancer    BP 132/74 mmHg  Pulse 75  Temp(Src) 98.2 F (36.8 C) (Oral)  Ht 5\' 5"  (1.651 m)  Wt 157 lb (71.215 kg)  BMI 26.13 kg/m2  SpO2 97%  Review of Systems He denies hypoglycemia     Objective:   Physical Exam VITAL SIGNS:  See vs page GENERAL: no distress CV: dorsalis pedis intact bilat.  Trace bilat leg edema.   Feet: no deformity. normal color and temp. . Long toenails. There is bilateral onychomycosis.  Skin: no ulcer on the feet.  Neuro: sensation is intact to touch on the feet.    A1c=6.9%    Assessment & Plan:  DM: well-controlled  Patient is advised the following: Patient Instructions  check your blood sugar once a day.  vary the time of day when you check, between before the 3 meals, and at bedtime.  also check if you have symptoms of your blood sugar being too high or too low.  please keep a record of the readings and bring it to your next appointment here.  You can write it on any piece of paper.  please call us sooner if your blood sugar goes below 70, or if you have a lot of readings over 200.  Please come back for a follow-up appointment in 4 months.   Please continue the same medications (you can stay off the invokana).

## 2015-02-20 ENCOUNTER — Ambulatory Visit (INDEPENDENT_AMBULATORY_CARE_PROVIDER_SITE_OTHER): Payer: Medicare Other | Admitting: Cardiovascular Disease

## 2015-02-20 ENCOUNTER — Encounter: Payer: Self-pay | Admitting: Cardiovascular Disease

## 2015-02-20 VITALS — BP 106/54 | HR 82 | Ht 65.0 in | Wt 156.8 lb

## 2015-02-20 DIAGNOSIS — I2510 Atherosclerotic heart disease of native coronary artery without angina pectoris: Secondary | ICD-10-CM | POA: Diagnosis not present

## 2015-02-20 DIAGNOSIS — I739 Peripheral vascular disease, unspecified: Secondary | ICD-10-CM | POA: Diagnosis not present

## 2015-02-20 DIAGNOSIS — E785 Hyperlipidemia, unspecified: Secondary | ICD-10-CM | POA: Diagnosis not present

## 2015-02-20 DIAGNOSIS — I10 Essential (primary) hypertension: Secondary | ICD-10-CM

## 2015-02-20 MED ORDER — CILOSTAZOL 100 MG PO TABS: 100.0000 mg | ORAL_TABLET | Freq: Two times a day (BID) | ORAL | Status: DC

## 2015-02-20 NOTE — Patient Instructions (Addendum)
  Medication Instructions:  Increase cilostazol 100mg  two times a day. You can take 2 of your 50mg  tablets two times a day and use your current supply.  Labwork: None today  Testing/Procedures: None today  Follow-Up: Your physician wants you to follow-up in: 6 months with Dr Fletcher Anon. (May 2017). You will receive a reminder letter in the mail two months in advance. If you don't receive a letter, please call our office to schedule the follow-up appointment.        If you need a refill on your cardiac medications before your next appointment, please call your pharmacy.

## 2015-02-20 NOTE — Assessment & Plan Note (Signed)
Severe left calf claudication improved with small dose cilostazol. He has no evidence of critical limb ischemia and considering his age, I think it's best to treat him medically at the present time. I increased the dose of cilostazol 100 mg twice daily.

## 2015-02-20 NOTE — Progress Notes (Signed)
Primary care physician: Dr. Jenny Reichmann  HPI  This is a pleasant 81 year old male who is here today for a follow up visit regarding peripheral arterial disease and claudication. He has reported history of coronary artery disease with no available details, prolonged history of diabetes, hyperlipidemia, hypertension and arthritis. He has no history of tobacco use. He was seen recently for severe left calf claudication . He underwent noninvasive evaluation which showed an ABI of 0.93 on the right and 0.31 on the left. Duplex showed moderate bilateral SFA disease, occluded left popliteal artery with severe tibial calcifications. Angiography in 7/16  showed: 1. No significant aortoiliac disease. 2. Moderate distal left common femoral artery stenosis. Mild nonobstructive disease in the left SFA. Subtotal occlusion of the left popliteal artery proximally with 1 vessel runoff below the knee. 3. Mild nonobstructive disease affecting the right SFA and popliteal arteries. 2 vessel runoff below the knee on the right. I started him on small dose cilostazol with improvement in claudication which did not resolve completely but he is able to walk longer distances. No rest pain or lower extremity ulceration.   Allergies  Allergen Reactions  . Levaquin [Levofloxacin In D5w] Nausea Only  . Tramadol Other (See Comments)    somnolence     Current Outpatient Prescriptions on File Prior to Visit  Medication Sig Dispense Refill  . acetaminophen-codeine (TYLENOL #3) 300-30 MG per tablet Take 1 tablet by mouth every 6 (six) hours as needed for moderate pain. 60 tablet 0  . aspirin 81 MG tablet Take 81 mg by mouth daily.    Marland Kitchen atorvastatin (LIPITOR) 20 MG tablet TAKE 1 TABLET (20 MG TOTAL) BY MOUTH DAILY. 90 tablet 1  . bromocriptine (PARLODEL) 2.5 MG tablet Take 0.5 tablets (1.25 mg total) by mouth at bedtime. 45 tablet 2  . cilostazol (PLETAL) 50 MG tablet Take 1 tablet (50 mg total) by mouth 2 (two) times daily. 180  tablet 3  . clobetasol (TEMOVATE) 0.05 % external solution Apply topically 2 (two) times daily. 50 mL 1  . fosinopril (MONOPRIL) 20 MG tablet TAKE 1 TABLET (20 MG TOTAL) BY MOUTH DAILY. 90 tablet 2  . glucose blood (ONETOUCH VERIO) test strip 1 each by Other route daily. And lancets 1/day 250.00 100 each 12  . isosorbide mononitrate (IMDUR) 60 MG 24 hr tablet Take 0.5 tablets (30 mg total) by mouth daily. 45 tablet 3  . JANUVIA 100 MG tablet TAKE 1 TABLET BY MOUTH EVERY DAY 90 tablet 1  . metoprolol succinate (TOPROL-XL) 50 MG 24 hr tablet Take 1 tablet (50 mg total) by mouth daily. Take with or immediately following a meal. 90 tablet 3  . repaglinide (PRANDIN) 1 MG tablet TAKE 1 TABLET (1 MG TOTAL) BY MOUTH 3 (THREE) TIMES DAILY BEFORE MEALS. 90 tablet 3   No current facility-administered medications on file prior to visit.     Past Medical History  Diagnosis Date  . Arthritis   . Diabetes mellitus   . Diverticulitis   . Glaucoma   . Hypertension   . Hyperlipidemia   . Hernia   . History of colonic diverticulitis 09/04/2010  . Anemia 09/04/2010  . DDD (degenerative disc disease), cervical 09/04/2010  . Diastolic dysfunction 99991111  . Psoriasis 09/04/2010  . BPH (benign prostatic hyperplasia) 09/09/2010  . CAD (coronary artery disease)      Past Surgical History  Procedure Laterality Date  . Tonsillectomy  1952  . Broken legs  1981 and 1995  . S/p lipoma  2003  . Peripheral vascular catheterization N/A 10/25/2014    Procedure: Abdominal Aortogram;  Surgeon: Wellington Hampshire, MD;  Location: [facility];  Service: Cardiovascular;  Laterality: N/A;  . Peripheral vascular catheterization Bilateral 10/25/2014    Procedure: Lower Extremity Angiography;  Surgeon: Wellington Hampshire, MD;  Location: [facility];  Service: Cardiovascular;  Laterality: Bilateral;     Family History  Problem Relation Age of Onset  . Arthritis Mother   . Heart disease Mother   . Hypertension  Mother   . Arthritis Father   . Heart disease Father   . Hypertension Father   . Cancer Other     lung cancer     Social History   Social History  . Marital Status: Single    Spouse Name: N/A  . Number of Children: N/A  . Years of Education: N/A   Occupational History  . retired    Social History Main Topics  . Smoking status: Never Smoker   . Smokeless tobacco: Not on file  . Alcohol Use: No  . Drug Use: No  . Sexual Activity: Not on file   Other Topics Concern  . Not on file   Social History Narrative     ROS A 10 point review of system was performed. It is negative other than that mentioned in the history of present illness.   PHYSICAL EXAM   BP 106/54 mmHg  Pulse 82  Ht 5\' 5"  (1.651 m)  Wt 156 lb 12.8 oz (71.124 kg)  BMI 26.09 kg/m2  SpO2 98% Constitutional: He is oriented to person, place, and time. He appears well-developed and well-nourished. No distress.  HENT: No nasal discharge.  Head: Normocephalic and atraumatic.  Eyes: Pupils are equal and round.  No discharge. Neck: Normal range of motion. Neck supple. No JVD present. No thyromegaly present.  Cardiovascular: Normal rate, regular rhythm, normal heart sounds. Exam reveals no gallop and no friction rub. No murmur heard.  Pulmonary/Chest: Effort normal and breath sounds normal. No stridor. No respiratory distress. He has no wheezes. He has no rales. He exhibits no tenderness.  Abdominal: Soft. Bowel sounds are normal. He exhibits no distension. There is no tenderness. There is no rebound and no guarding.  Musculoskeletal: Normal range of motion. He exhibits no edema and no tenderness.  Neurological: He is alert and oriented to person, place, and time. Coordination normal.  Skin: Skin is warm and dry. No rash noted. He is not diaphoretic. No erythema. No pallor.  Psychiatric: He has a normal mood and affect. His behavior is normal. Judgment and thought content normal.      ASSESSMENT AND  PLAN

## 2015-02-20 NOTE — Assessment & Plan Note (Signed)
He reports no anginal symptoms.  

## 2015-02-20 NOTE — Assessment & Plan Note (Signed)
Blood pressure is controlled on current medications. Sinus tachycardia improved after switching him to long-acting metoprolol.

## 2015-02-20 NOTE — Assessment & Plan Note (Signed)
Lab Results  Component Value Date   CHOL 150 10/05/2014   HDL 37.20* 10/05/2014   LDLCALC 89 10/05/2014        TRIG 121.0 10/05/2014   CHOLHDL 4 10/05/2014   Continue treatment with atorvastatin. LDL was close to target.

## 2015-03-22 ENCOUNTER — Other Ambulatory Visit: Payer: Self-pay | Admitting: Internal Medicine

## 2015-04-06 ENCOUNTER — Other Ambulatory Visit: Payer: Self-pay

## 2015-04-06 ENCOUNTER — Ambulatory Visit (INDEPENDENT_AMBULATORY_CARE_PROVIDER_SITE_OTHER): Payer: Medicare Other | Admitting: Internal Medicine

## 2015-04-06 ENCOUNTER — Other Ambulatory Visit (INDEPENDENT_AMBULATORY_CARE_PROVIDER_SITE_OTHER): Payer: Medicare Other

## 2015-04-06 ENCOUNTER — Encounter: Payer: Self-pay | Admitting: Internal Medicine

## 2015-04-06 VITALS — BP 170/80 | HR 98 | Temp 97.7°F | Ht 65.0 in | Wt 157.0 lb

## 2015-04-06 DIAGNOSIS — I10 Essential (primary) hypertension: Secondary | ICD-10-CM | POA: Diagnosis not present

## 2015-04-06 DIAGNOSIS — E785 Hyperlipidemia, unspecified: Secondary | ICD-10-CM

## 2015-04-06 DIAGNOSIS — Z23 Encounter for immunization: Secondary | ICD-10-CM

## 2015-04-06 DIAGNOSIS — D508 Other iron deficiency anemias: Secondary | ICD-10-CM | POA: Diagnosis not present

## 2015-04-06 DIAGNOSIS — K5900 Constipation, unspecified: Secondary | ICD-10-CM

## 2015-04-06 DIAGNOSIS — I2510 Atherosclerotic heart disease of native coronary artery without angina pectoris: Secondary | ICD-10-CM

## 2015-04-06 LAB — CBC WITH DIFFERENTIAL/PLATELET
BASOS ABS: 0 10*3/uL (ref 0.0–0.1)
BASOS PCT: 0.2 % (ref 0.0–3.0)
EOS ABS: 0.1 10*3/uL (ref 0.0–0.7)
Eosinophils Relative: 1.7 % (ref 0.0–5.0)
HEMATOCRIT: 40.4 % (ref 39.0–52.0)
HEMOGLOBIN: 13 g/dL (ref 13.0–17.0)
LYMPHS PCT: 24.5 % (ref 12.0–46.0)
Lymphs Abs: 1.8 10*3/uL (ref 0.7–4.0)
MCHC: 32.2 g/dL (ref 30.0–36.0)
MCV: 87 fl (ref 78.0–100.0)
Monocytes Absolute: 0.6 10*3/uL (ref 0.1–1.0)
Monocytes Relative: 8.5 % (ref 3.0–12.0)
Neutro Abs: 4.8 10*3/uL (ref 1.4–7.7)
Neutrophils Relative %: 65.1 % (ref 43.0–77.0)
Platelets: 183 10*3/uL (ref 150.0–400.0)
RBC: 4.64 Mil/uL (ref 4.22–5.81)
RDW: 14.8 % (ref 11.5–15.5)
WBC: 7.4 10*3/uL (ref 4.0–10.5)

## 2015-04-06 LAB — LIPID PANEL
CHOL/HDL RATIO: 3
Cholesterol: 138 mg/dL (ref 0–200)
HDL: 44.3 mg/dL (ref >39.00)
LDL Cholesterol: 73 mg/dL (ref 0–99)
NonHDL: 93.43
TRIGLYCERIDES: 103 mg/dL (ref 0.0–149.0)
VLDL: 20.6 mg/dL (ref 0.0–40.0)

## 2015-04-06 LAB — BASIC METABOLIC PANEL
BUN: 18 mg/dL (ref 6–23)
CHLORIDE: 103 meq/L (ref 96–112)
CO2: 30 mEq/L (ref 19–32)
CREATININE: 1 mg/dL (ref 0.40–1.50)
Calcium: 10.8 mg/dL — ABNORMAL HIGH (ref 8.4–10.5)
GFR: 74.99 mL/min (ref >60.00)
Glucose, Bld: 139 mg/dL — ABNORMAL HIGH (ref 70–99)
Potassium: 5.2 mEq/L — ABNORMAL HIGH (ref 3.5–5.1)
Sodium: 141 mEq/L (ref 135–145)

## 2015-04-06 LAB — HEPATIC FUNCTION PANEL
ALT: 11 U/L (ref 0–53)
AST: 16 U/L (ref 0–37)
Albumin: 4.4 g/dL (ref 3.5–5.2)
Alkaline Phosphatase: 53 U/L (ref 39–117)
BILIRUBIN TOTAL: 0.6 mg/dL (ref 0.2–1.2)
Bilirubin, Direct: 0.1 mg/dL (ref 0.0–0.3)
TOTAL PROTEIN: 7.7 g/dL (ref 6.0–8.3)

## 2015-04-06 NOTE — Assessment & Plan Note (Addendum)
stable overall by history and exam, recent data reviewed with pt, and pt to continue medical treatment as before,  to f/u any worsening symptoms or concerns Lab Results  Component Value Date   LDLCALC 89 10/05/2014  for fu lab

## 2015-04-06 NOTE — Assessment & Plan Note (Signed)
Mild, exam benign, for miralax otc prn,  to f/u any worsening symptoms or concerns

## 2015-04-06 NOTE — Patient Instructions (Signed)

## 2015-04-06 NOTE — Assessment & Plan Note (Signed)
Lab Results  Component Value Date   WBC 9.0 10/17/2014   HGB 12.7* 10/17/2014   HCT 38.4* 10/17/2014   MCV 85.1 10/17/2014   PLT 159.0 10/17/2014   For f/u today

## 2015-04-06 NOTE — Assessment & Plan Note (Signed)
Did not take BP meds this am, encouraged to take these meds even when not eating bfast in future,  to f/u any worsening symptoms or concerns

## 2015-04-06 NOTE — Progress Notes (Signed)
Subjective:    Patient ID: Jeffrey Kaiser, male    DOB: 07/19/1925, 81 y.o.   MRN: [id number]  HPI  Here to f/u; overall doing ok,  Pt denies chest pain, increasing sob or doe, wheezing, orthopnea, PND, increased LE swelling, palpitations, dizziness or syncope.  Pt denies new neurological symptoms such as new headache, or facial or extremity weakness or numbness.  Pt denies polydipsia, polyuria, or low sugar episode.   Pt denies new neurological symptoms such as new headache, or facial or extremity weakness or numbness.   Pt states overall good compliance with meds, mostly trying to follow appropriate diet, with wt overall stable,  but little exercise however. Did not take all meds this am as he did not eat bfast thinging there might be fasting lab work.  Recent a1c 6.14 Feb 2015, sees endo,m with good control.  BP at home usually < 140;90.  Wife ill at home with what sounds like URI.  Claudication symptoms improved overall, still some discomfort on left but minor per pt. Still some stiffness occurring in neck but no bowel or bladder change, fever, wt loss,  worsening LE pain/numbness/weakness, gait change or falls.  No cough or ST,  Denies urinary symptoms such as dysuria, frequency, urgency, flank pain, hematuria or n/v, fever, chills. Past Medical History  Diagnosis Date  . Arthritis   . Diabetes mellitus   . Diverticulitis   . Glaucoma   . Hypertension   . Hyperlipidemia   . Hernia   . History of colonic diverticulitis 09/04/2010  . Anemia 09/04/2010  . DDD (degenerative disc disease), cervical 09/04/2010  . Diastolic dysfunction 99991111  . Psoriasis 09/04/2010  . BPH (benign prostatic hyperplasia) 09/09/2010  . CAD (coronary artery disease)    Past Surgical History  Procedure Laterality Date  . Tonsillectomy  1952  . Broken legs  1981 and 1995  . S/p lipoma  2003  . Peripheral vascular catheterization N/A 10/25/2014    Procedure: Abdominal Aortogram;  Surgeon: Wellington Hampshire, MD;   Location: [facility];  Service: Cardiovascular;  Laterality: N/A;  . Peripheral vascular catheterization Bilateral 10/25/2014    Procedure: Lower Extremity Angiography;  Surgeon: Wellington Hampshire, MD;  Location: [facility];  Service: Cardiovascular;  Laterality: Bilateral;    reports that he has never smoked. He does not have any smokeless tobacco history on file. He reports that he does not drink alcohol or use illicit drugs. family history includes Arthritis in his father and mother; Cancer in his other; Heart disease in his father and mother; Hypertension in his father and mother. Allergies  Allergen Reactions  . Levaquin [Levofloxacin In D5w] Nausea Only  . Tramadol Other (See Comments)    somnolence   Current Outpatient Prescriptions on File Prior to Visit  Medication Sig Dispense Refill  . acetaminophen-codeine (TYLENOL #3) 300-30 MG per tablet Take 1 tablet by mouth every 6 (six) hours as needed for moderate pain. 60 tablet 0  . aspirin 81 MG tablet Take 81 mg by mouth daily.    Marland Kitchen atorvastatin (LIPITOR) 20 MG tablet TAKE 1 TABLET (20 MG TOTAL) BY MOUTH DAILY. 90 tablet 1  . bromocriptine (PARLODEL) 2.5 MG tablet Take 0.5 tablets (1.25 mg total) by mouth at bedtime. 45 tablet 2  . cilostazol (PLETAL) 100 MG tablet Take 1 tablet (100 mg total) by mouth 2 (two) times daily. 180 tablet 3  . clobetasol (TEMOVATE) 0.05 % external solution Apply topically 2 (two)  times daily. 50 mL 1  . fosinopril (MONOPRIL) 20 MG tablet TAKE 1 TABLET (20 MG TOTAL) BY MOUTH DAILY. 90 tablet 2  . glucose blood (ONETOUCH VERIO) test strip 1 each by Other route daily. And lancets 1/day 250.00 100 each 12  . isosorbide mononitrate (IMDUR) 60 MG 24 hr tablet Take 0.5 tablets (30 mg total) by mouth daily. 45 tablet 3  . JANUVIA 100 MG tablet TAKE 1 TABLET BY MOUTH EVERY DAY 90 tablet 1  . metFORMIN (GLUCOPHAGE) 500 MG tablet Take 500 mg by mouth as directed. Take 2 tablets in the morning, 1 tablet at  noon and 2 tablets at evening    . metoprolol succinate (TOPROL-XL) 50 MG 24 hr tablet Take 1 tablet (50 mg total) by mouth daily. Take with or immediately following a meal. 90 tablet 3  . repaglinide (PRANDIN) 1 MG tablet TAKE 1 TABLET (1 MG TOTAL) BY MOUTH 3 (THREE) TIMES DAILY BEFORE MEALS. 90 tablet 3   No current facility-administered medications on file prior to visit.      Review of Systems VS noted,  Constitutional: Pt appears in no significant distress HENT: Head: NCAT.  Right Ear: External ear normal.  Left Ear: External ear normal.  Eyes: . Pupils are equal, round, and reactive to light. Conjunctivae and EOM are normal Neck: Normal range of motion. Neck supple.  Cardiovascular: Normal rate and regular rhythm.   Pulmonary/Chest: Effort normal and breath sounds without rales or wheezing.  Abd:  Soft, NT, ND, + BS Neurological: Pt is alert. Not confused , motor grossly intact Skin: Skin is warm. No rash, no LE edema Psychiatric: Pt behavior is normal. No agitation.  Does have occas constipation with hard stools but minor    Objective:   Physical Exam BP 170/80 mmHg  Pulse 98  Temp(Src) 97.7 F (36.5 C) (Oral)  Ht 5\' 5"  (1.651 m)  Wt 157 lb (71.215 kg)  BMI 26.13 kg/m2  SpO2 96% VS noted, not ill appearing Constitutional: Pt appears in no significant distress HENT: Head: NCAT.  Right Ear: External ear normal.  Left Ear: External ear normal.  Eyes: . Pupils are equal, round, and reactive to light. Conjunctivae and EOM are normal Neck: Normal range of motion. Neck supple.  Cardiovascular: Normal rate and regular rhythm.   Pulmonary/Chest: Effort normal and breath sounds without rales or wheezing.  Abd:  Soft, NT, ND, + BS Neurological: Pt is alert. Not confused , motor grossly intact Skin: Skin is warm. No rash, no LE edema Psychiatric: Pt behavior is normal. No agitation.     Assessment & Plan:

## 2015-04-06 NOTE — Progress Notes (Signed)
Pre visit review using our clinic review tool, if applicable. No additional management support is needed unless otherwise documented below in the visit note. 

## 2015-04-06 NOTE — Addendum Note (Signed)
Addended by: Lyman Bishop on: 04/06/2015 04:54 PM   Modules accepted: Orders

## 2015-04-10 ENCOUNTER — Other Ambulatory Visit: Payer: Medicare Other

## 2015-04-10 DIAGNOSIS — E785 Hyperlipidemia, unspecified: Secondary | ICD-10-CM | POA: Diagnosis not present

## 2015-04-11 LAB — PTH, INTACT AND CALCIUM
Calcium: 9.7 mg/dL (ref 8.4–10.5)
PTH: 54 pg/mL (ref 14–64)

## 2015-04-26 DIAGNOSIS — H524 Presbyopia: Secondary | ICD-10-CM | POA: Diagnosis not present

## 2015-04-26 DIAGNOSIS — Z961 Presence of intraocular lens: Secondary | ICD-10-CM | POA: Diagnosis not present

## 2015-04-26 DIAGNOSIS — H401132 Primary open-angle glaucoma, bilateral, moderate stage: Secondary | ICD-10-CM | POA: Diagnosis not present

## 2015-04-26 DIAGNOSIS — H52223 Regular astigmatism, bilateral: Secondary | ICD-10-CM | POA: Diagnosis not present

## 2015-04-26 DIAGNOSIS — E119 Type 2 diabetes mellitus without complications: Secondary | ICD-10-CM | POA: Diagnosis not present

## 2015-05-28 DIAGNOSIS — H3589 Other specified retinal disorders: Secondary | ICD-10-CM | POA: Diagnosis not present

## 2015-05-28 DIAGNOSIS — H401132 Primary open-angle glaucoma, bilateral, moderate stage: Secondary | ICD-10-CM | POA: Diagnosis not present

## 2015-05-28 DIAGNOSIS — Z961 Presence of intraocular lens: Secondary | ICD-10-CM | POA: Diagnosis not present

## 2015-05-28 DIAGNOSIS — H53432 Sector or arcuate defects, left eye: Secondary | ICD-10-CM | POA: Diagnosis not present

## 2015-06-12 ENCOUNTER — Ambulatory Visit (INDEPENDENT_AMBULATORY_CARE_PROVIDER_SITE_OTHER): Payer: Medicare Other | Admitting: Endocrinology

## 2015-06-12 ENCOUNTER — Encounter: Payer: Self-pay | Admitting: Endocrinology

## 2015-06-12 VITALS — BP 144/78 | HR 93 | Temp 97.9°F | Ht 65.0 in | Wt 154.0 lb

## 2015-06-12 DIAGNOSIS — E1159 Type 2 diabetes mellitus with other circulatory complications: Secondary | ICD-10-CM | POA: Diagnosis not present

## 2015-06-12 DIAGNOSIS — E1151 Type 2 diabetes mellitus with diabetic peripheral angiopathy without gangrene: Secondary | ICD-10-CM | POA: Diagnosis not present

## 2015-06-12 LAB — POCT GLYCOSYLATED HEMOGLOBIN (HGB A1C): Hemoglobin A1C: 7.5

## 2015-06-12 NOTE — Progress Notes (Signed)
Subjective:    Patient ID: Jeffrey Kaiser, male    DOB: 1926-03-03, 82 y.o.   MRN: [id number]  HPI Pt returns for f/u of diabetes mellitus: DM type: 2 Dx'ed: [id number] Complications: retinopathy, CAD, and PAD. Therapy: 3 oral meds DKA: never. Severe hypoglycemia: never Pancreatitis: never.   Other: he has never been on insulin; invokana rx is limited by hyperkalemia.  Interval history: no cbg record, but states cbg's are well-controlled.  pt states she feels well in general.   Past Medical History  Diagnosis Date  . Arthritis   . Diabetes mellitus   . Diverticulitis   . Glaucoma   . Hypertension   . Hyperlipidemia   . Hernia   . History of colonic diverticulitis 09/04/2010  . Anemia 09/04/2010  . DDD (degenerative disc disease), cervical 09/04/2010  . Diastolic dysfunction 99991111  . Psoriasis 09/04/2010  . BPH (benign prostatic hyperplasia) 09/09/2010  . CAD (coronary artery disease)     Past Surgical History  Procedure Laterality Date  . Tonsillectomy  1952  . Broken legs  1981 and 1995  . S/p lipoma  2003  . Peripheral vascular catheterization N/A 10/25/2014    Procedure: Abdominal Aortogram;  Surgeon: Wellington Hampshire, MD;  Location: [facility];  Service: Cardiovascular;  Laterality: N/A;  . Peripheral vascular catheterization Bilateral 10/25/2014    Procedure: Lower Extremity Angiography;  Surgeon: Wellington Hampshire, MD;  Location: [facility];  Service: Cardiovascular;  Laterality: Bilateral;    Social History   Social History  . Marital Status: Single    Spouse Name: N/A  . Number of Children: N/A  . Years of Education: N/A   Occupational History  . retired    Social History Main Topics  . Smoking status: Never Smoker   . Smokeless tobacco: Not on file  . Alcohol Use: No  . Drug Use: No  . Sexual Activity: Not on file   Other Topics Concern  . Not on file   Social History Narrative    Current Outpatient Prescriptions on File Prior to  Visit  Medication Sig Dispense Refill  . acetaminophen-codeine (TYLENOL #3) 300-30 MG per tablet Take 1 tablet by mouth every 6 (six) hours as needed for moderate pain. 60 tablet 0  . aspirin 81 MG tablet Take 81 mg by mouth daily.    Marland Kitchen atorvastatin (LIPITOR) 20 MG tablet TAKE 1 TABLET (20 MG TOTAL) BY MOUTH DAILY. 90 tablet 1  . bromocriptine (PARLODEL) 2.5 MG tablet Take 0.5 tablets (1.25 mg total) by mouth at bedtime. 45 tablet 2  . cilostazol (PLETAL) 100 MG tablet Take 1 tablet (100 mg total) by mouth 2 (two) times daily. 180 tablet 3  . clobetasol (TEMOVATE) 0.05 % external solution Apply topically 2 (two) times daily. 50 mL 1  . fosinopril (MONOPRIL) 20 MG tablet TAKE 1 TABLET (20 MG TOTAL) BY MOUTH DAILY. 90 tablet 2  . glucose blood (ONETOUCH VERIO) test strip 1 each by Other route daily. And lancets 1/day 250.00 100 each 12  . isosorbide mononitrate (IMDUR) 60 MG 24 hr tablet Take 0.5 tablets (30 mg total) by mouth daily. 45 tablet 3  . metFORMIN (GLUCOPHAGE) 500 MG tablet Take 500 mg by mouth as directed. Take 2 tablets in the morning, 1 tablet at noon and 2 tablets at evening    . metoprolol succinate (TOPROL-XL) 50 MG 24 hr tablet Take 1 tablet (50 mg total) by mouth daily. Take with or immediately  following a meal. 90 tablet 3  . repaglinide (PRANDIN) 1 MG tablet TAKE 1 TABLET (1 MG TOTAL) BY MOUTH 3 (THREE) TIMES DAILY BEFORE MEALS. 90 tablet 3   No current facility-administered medications on file prior to visit.    Allergies  Allergen Reactions  . Levaquin [Levofloxacin In D5w] Nausea Only  . Tramadol Other (See Comments)    somnolence    Family History  Problem Relation Age of Onset  . Arthritis Mother   . Heart disease Mother   . Hypertension Mother   . Arthritis Father   . Heart disease Father   . Hypertension Father   . Cancer Other     lung cancer    BP 144/78 mmHg  Pulse 93  Temp(Src) 97.9 F (36.6 C) (Oral)  Ht 5\' 5"  (1.651 m)  Wt 154 lb (69.854 kg)   BMI 25.63 kg/m2  SpO2 94%  Review of Systems He denies hypoglycemia.      Objective:   Physical Exam VITAL SIGNS: See vs page GENERAL: no distress CV: dorsalis pedis absent bilat. Trace bilat leg edema.  Feet: no deformity. normal color and temp.  There is bilateral onychomycosis.  Skin: no ulcer on the feet.  Neuro: sensation is intact to touch on the feet.    A1c=7.5% Lab Results  Component Value Date   PTH 54 04/10/2015   CALCIUM 9.7 04/10/2015   PHOS 2.8 08/01/2009      Assessment & Plan:  DM: well-controlled   Patient is advised the following: Patient Instructions  check your blood sugar once a day.  vary the time of day when you check, between before the 3 meals, and at bedtime.  also check if you have symptoms of your blood sugar being too high or too low.  please keep a record of the readings and bring it to your next appointment here.  You can write it on any piece of paper.  please call us sooner if your blood sugar goes below 70, or if you have a lot of readings over 200.  Please continue the same medications.   Please come back for a follow-up appointment in 4 months.

## 2015-06-12 NOTE — Patient Instructions (Addendum)
check your blood sugar once a day.  vary the time of day when you check, between before the 3 meals, and at bedtime.  also check if you have symptoms of your blood sugar being too high or too low.  please keep a record of the readings and bring it to your next appointment here.  You can write it on any piece of paper.  please call us sooner if your blood sugar goes below 70, or if you have a lot of readings over 200.   Please continue the same medications Please come back for a follow-up appointment in 4 months.    

## 2015-06-13 DIAGNOSIS — E119 Type 2 diabetes mellitus without complications: Secondary | ICD-10-CM | POA: Insufficient documentation

## 2015-08-03 ENCOUNTER — Other Ambulatory Visit: Payer: Self-pay | Admitting: *Deleted

## 2015-08-03 MED ORDER — METFORMIN HCL 500 MG PO TABS: 500.0000 mg | ORAL_TABLET | ORAL | Status: DC

## 2015-08-03 NOTE — Telephone Encounter (Signed)
Receive call pt requesting refills on his metformin. Verified pharmacy inform pt will send to [facility].../lmb

## 2015-08-21 ENCOUNTER — Encounter: Payer: Self-pay | Admitting: Cardiovascular Disease

## 2015-08-21 ENCOUNTER — Ambulatory Visit (INDEPENDENT_AMBULATORY_CARE_PROVIDER_SITE_OTHER): Payer: Medicare Other | Admitting: Cardiovascular Disease

## 2015-08-21 VITALS — BP 158/90 | HR 93 | Ht 65.0 in | Wt 154.8 lb

## 2015-08-21 DIAGNOSIS — I739 Peripheral vascular disease, unspecified: Secondary | ICD-10-CM

## 2015-08-21 NOTE — Patient Instructions (Signed)

## 2015-08-21 NOTE — Progress Notes (Signed)
Cardiology Office Note   Date:  08/22/2015   ID:  Jeffrey Kaiser [birth year], MRN [id number]  PCP:  Cathlean Cower, MD  Cardiologist:   [name], MD   No chief complaint on file.     History of Present Illness: Jeffrey Kaiser is a 82 y.o. male who presents for a follow up visit regarding peripheral arterial disease and claudication. He has reported history of coronary artery disease with no available details, prolonged history of diabetes, hyperlipidemia, hypertension and arthritis. He has no history of tobacco use. He was seen last year for severe left calf claudication . He underwent noninvasive evaluation which showed an ABI of 0.93 on the right and 0.31 on the left. Duplex showed moderate bilateral SFA disease, occluded left popliteal artery with severe tibial calcifications. Angiography in 7/16  showed: 1. No significant aortoiliac disease. 2. Moderate distal left common femoral artery stenosis. Mild nonobstructive disease in the left SFA. Subtotal occlusion of the left popliteal artery proximally with 1 vessel runoff below the knee. 3. Mild nonobstructive disease affecting the right SFA and popliteal arteries. 2 vessel runoff below the knee on the right. Claudication improved significantly with cilostazol and currently this is minimal. He tries to stay active. He complains of persistent groin yeast infection and is going to see a dermatologist soon. He also complains of right upper quadrant and right-sided chest pain mostly after he eats. No exertional chest pain.    Past Medical History  Diagnosis Date  . Arthritis   . Diabetes mellitus   . Diverticulitis   . Glaucoma   . Hypertension   . Hyperlipidemia   . Hernia   . History of colonic diverticulitis 09/04/2010  . Anemia 09/04/2010  . DDD (degenerative disc disease), cervical 09/04/2010  . Diastolic dysfunction 99991111  . Psoriasis 09/04/2010  . BPH (benign prostatic hyperplasia) 09/09/2010  . CAD (coronary  artery disease)     Past Surgical History  Procedure Laterality Date  . Tonsillectomy  1952  . Broken legs  1981 and 1995  . S/p lipoma  2003  . Peripheral vascular catheterization N/A 10/25/2014    Procedure: Abdominal Aortogram;  Surgeon: Wellington Hampshire, MD;  Location: [facility];  Service: Cardiovascular;  Laterality: N/A;  . Peripheral vascular catheterization Bilateral 10/25/2014    Procedure: Lower Extremity Angiography;  Surgeon: Wellington Hampshire, MD;  Location: [facility];  Service: Cardiovascular;  Laterality: Bilateral;     Current Outpatient Prescriptions  Medication Sig Dispense Refill  . acetaminophen-codeine (TYLENOL #3) 300-30 MG per tablet Take 1 tablet by mouth every 6 (six) hours as needed for moderate pain. 60 tablet 0  . aspirin 81 MG tablet Take 81 mg by mouth daily.    Marland Kitchen atorvastatin (LIPITOR) 20 MG tablet TAKE 1 TABLET (20 MG TOTAL) BY MOUTH DAILY. 90 tablet 1  . bromocriptine (PARLODEL) 2.5 MG tablet Take 0.5 tablets (1.25 mg total) by mouth at bedtime. 45 tablet 2  . ciclopirox (LOPROX) 0.77 % cream Apply 1 application topically daily as needed.    . cilostazol (PLETAL) 100 MG tablet Take 1 tablet (100 mg total) by mouth 2 (two) times daily. 180 tablet 3  . clobetasol (TEMOVATE) 0.05 % external solution Apply topically 2 (two) times daily. 50 mL 1  . fosinopril (MONOPRIL) 20 MG tablet TAKE 1 TABLET (20 MG TOTAL) BY MOUTH DAILY. 90 tablet 2  . glucose blood (ONETOUCH VERIO) test strip 1 each by Other route  daily. And lancets 1/day 250.00 100 each 12  . isosorbide mononitrate (IMDUR) 60 MG 24 hr tablet Take 0.5 tablets (30 mg total) by mouth daily. 45 tablet 3  . metFORMIN (GLUCOPHAGE) 500 MG tablet Take 1 tablet (500 mg total) by mouth as directed. Take 2 tablets in the morning, 1 tablet at noon and 2 tablets at evening 150 tablet 5  . metoprolol succinate (TOPROL-XL) 50 MG 24 hr tablet Take 1 tablet (50 mg total) by mouth daily. Take with or  immediately following a meal. 90 tablet 3  . repaglinide (PRANDIN) 1 MG tablet TAKE 1 TABLET (1 MG TOTAL) BY MOUTH 3 (THREE) TIMES DAILY BEFORE MEALS. 90 tablet 3  . TRAVATAN Z 0.004 % SOLN ophthalmic solution Place 1 drop into both eyes at bedtime.  4   No current facility-administered medications for this visit.    Allergies:   Levaquin and Tramadol    Social History:  The patient  reports that he has never smoked. He does not have any smokeless tobacco history on file. He reports that he does not drink alcohol or use illicit drugs.   Family History:  The patient's family history includes Arthritis in his father and mother; Cancer in his other; Heart disease in his father and mother; Hypertension in his father and mother.    ROS:  Please see the history of present illness.   Otherwise, review of systems are positive for none.   All other systems are reviewed and negative.    PHYSICAL EXAM: VS:  BP 158/90 mmHg  Pulse 93  Ht 5\' 5"  (1.651 m)  Wt 154 lb 12.8 oz (70.217 kg)  BMI 25.76 kg/m2 , BMI Body mass index is 25.76 kg/(m^2). GEN: Well nourished, well developed, in no acute distress HEENT: normal Neck: no JVD, carotid bruits, or masses Cardiac: RRR; no murmurs, rubs, or gallops,no edema  Respiratory:  clear to auscultation bilaterally, normal work of breathing GI: soft, nontender, nondistended, + BS MS: no deformity or atrophy Skin: warm and dry, no rash Neuro:  Strength and sensation are intact Psych: euthymic mood, full affect   EKG:  EKG is ordered today. The ekg ordered today demonstrates sinus rhythm with PVCs, left ventricular hypertrophy with repolarization abnormalities.   Recent Labs: 10/05/2014: TSH 4.74* 04/06/2015: ALT 11; BUN 18; Creatinine, Ser 1.00; Hemoglobin 13.0; Platelets 183.0; Potassium 5.2*; Sodium 141    Lipid Panel    Component Value Date/Time   CHOL 138 04/06/2015 0904   TRIG 103.0 04/06/2015 0904   HDL 44.30 04/06/2015 0904   CHOLHDL 3  04/06/2015 0904   VLDL 20.6 04/06/2015 0904   LDLCALC 73 04/06/2015 0904   LDLDIRECT 114.4 05/13/2012 1417      Wt Readings from Last 3 Encounters:  08/21/15 154 lb 12.8 oz (70.217 kg)  06/12/15 154 lb (69.854 kg)  04/06/15 157 lb (71.215 kg)       ASSESSMENT AND PLAN:  1. Peripheral arterial disease with claudication: Symptoms improved significantly with cilostazol. His current symptoms are not lifestyle limiting and thus I recommend continuing medical therapy.  2. Right sided chest pain with right upper quadrant pain: This is mostly happening after he eats. I suggested an abdominal ultrasound to evaluate his gallbladder but he wants to discuss with his primary care physician if symptoms persist.   3. Hyperlipidemia: Continue treatment with atorvastatin. Most recent LDL was 73.     Disposition:   FU with me in 6 months  Signed,  [name], MD  08/22/2015 5:20 PM    [facility]

## 2015-08-23 ENCOUNTER — Ambulatory Visit: Payer: Medicare Other | Admitting: Internal Medicine

## 2015-10-03 ENCOUNTER — Ambulatory Visit (INDEPENDENT_AMBULATORY_CARE_PROVIDER_SITE_OTHER): Payer: Medicare Other | Admitting: Internal Medicine

## 2015-10-03 ENCOUNTER — Encounter: Payer: Self-pay | Admitting: Internal Medicine

## 2015-10-03 VITALS — BP 120/68 | HR 95 | Temp 98.4°F | Resp 20 | Wt 154.0 lb

## 2015-10-03 DIAGNOSIS — I519 Heart disease, unspecified: Secondary | ICD-10-CM | POA: Diagnosis not present

## 2015-10-03 DIAGNOSIS — I10 Essential (primary) hypertension: Secondary | ICD-10-CM

## 2015-10-03 DIAGNOSIS — E785 Hyperlipidemia, unspecified: Secondary | ICD-10-CM

## 2015-10-03 DIAGNOSIS — I5189 Other ill-defined heart diseases: Secondary | ICD-10-CM

## 2015-10-03 NOTE — Progress Notes (Signed)
Subjective:    Patient ID: Jeffrey Kaiser, male    DOB: [birth year], 82 y.o.   MRN: [id number]  HPI Here to f/u; overall doing ok,  Pt denies chest pain, increasing sob or doe, wheezing, orthopnea, PND, increased LE swelling, palpitations, dizziness or syncope.  Pt denies new neurological symptoms such as new headache, or facial or extremity weakness or numbness.  Pt denies polydipsia, polyuria, or low sugar episode.   Pt denies new neurological symptoms such as new headache, or facial or extremity weakness or numbness.   Pt states overall good compliance with meds, mostly trying to follow appropriate diet, with wt overall stable,  but occas exercise however. C/o chronic right shoulder pain, and cannot seem to hold his head up straight for any longer periods of time.  Takes tylenol, helps well enough.   Pt denies fever, wt loss, night sweats, loss of appetite, or other constitutional symptoms  Denies worsening reflux, abd pain, dysphagia, n/v, bowel change or blood.  Denies urinary symptoms such as dysuria, frequency, urgency, flank pain, hematuria or n/v, fever, chills. Past Medical History  Diagnosis Date  . Arthritis   . Diabetes mellitus   . Diverticulitis   . Glaucoma   . Hypertension   . Hyperlipidemia   . Hernia   . History of colonic diverticulitis 09/04/2010  . Anemia 09/04/2010  . DDD (degenerative disc disease), cervical 09/04/2010  . Diastolic dysfunction 99991111  . Psoriasis 09/04/2010  . BPH (benign prostatic hyperplasia) 09/09/2010  . CAD (coronary artery disease)    Past Surgical History  Procedure Laterality Date  . Tonsillectomy  1952  . Broken legs  1981 and 1995  . S/p lipoma  2003  . Peripheral vascular catheterization N/A 10/25/2014    Procedure: Abdominal Aortogram;  Surgeon: Wellington Hampshire, MD;  Location: [facility];  Service: Cardiovascular;  Laterality: N/A;  . Peripheral vascular catheterization Bilateral 10/25/2014    Procedure: Lower Extremity  Angiography;  Surgeon: Wellington Hampshire, MD;  Location: [facility];  Service: Cardiovascular;  Laterality: Bilateral;    reports that he has never smoked. He does not have any smokeless tobacco history on file. He reports that he does not drink alcohol or use illicit drugs. family history includes Arthritis in his father and mother; Cancer in his other; Heart disease in his father and mother; Hypertension in his father and mother. Allergies  Allergen Reactions  . Levaquin [Levofloxacin In D5w] Nausea Only  . Tramadol Other (See Comments)    somnolence   Current Outpatient Prescriptions on File Prior to Visit  Medication Sig Dispense Refill  . acetaminophen-codeine (TYLENOL #3) 300-30 MG per tablet Take 1 tablet by mouth every 6 (six) hours as needed for moderate pain. 60 tablet 0  . aspirin 81 MG tablet Take 81 mg by mouth daily.    Marland Kitchen atorvastatin (LIPITOR) 20 MG tablet TAKE 1 TABLET (20 MG TOTAL) BY MOUTH DAILY. 90 tablet 1  . bromocriptine (PARLODEL) 2.5 MG tablet Take 0.5 tablets (1.25 mg total) by mouth at bedtime. 45 tablet 2  . ciclopirox (LOPROX) 0.77 % cream Apply 1 application topically daily as needed.    . cilostazol (PLETAL) 100 MG tablet Take 1 tablet (100 mg total) by mouth 2 (two) times daily. 180 tablet 3  . clobetasol (TEMOVATE) 0.05 % external solution Apply topically 2 (two) times daily. 50 mL 1  . fosinopril (MONOPRIL) 20 MG tablet TAKE 1 TABLET (20 MG TOTAL) BY MOUTH DAILY.  90 tablet 2  . glucose blood (ONETOUCH VERIO) test strip 1 each by Other route daily. And lancets 1/day 250.00 100 each 12  . isosorbide mononitrate (IMDUR) 60 MG 24 hr tablet Take 0.5 tablets (30 mg total) by mouth daily. 45 tablet 3  . metFORMIN (GLUCOPHAGE) 500 MG tablet Take 1 tablet (500 mg total) by mouth as directed. Take 2 tablets in the morning, 1 tablet at noon and 2 tablets at evening 150 tablet 5  . metoprolol succinate (TOPROL-XL) 50 MG 24 hr tablet Take 1 tablet (50 mg total) by  mouth daily. Take with or immediately following a meal. 90 tablet 3  . repaglinide (PRANDIN) 1 MG tablet TAKE 1 TABLET (1 MG TOTAL) BY MOUTH 3 (THREE) TIMES DAILY BEFORE MEALS. 90 tablet 3  . TRAVATAN Z 0.004 % SOLN ophthalmic solution Place 1 drop into both eyes at bedtime.  4   No current facility-administered medications on file prior to visit.   Review of Systems  Constitutional: Negative for unusual diaphoresis or night sweats HENT: Negative for ear swelling or discharge Eyes: Negative for worsening visual haziness  Respiratory: Negative for choking and stridor.   Gastrointestinal: Negative for distension or worsening eructation Genitourinary: Negative for retention or change in urine volume.  Musculoskeletal: Negative for other MSK pain or swelling Skin: Negative for color change and worsening wound Neurological: Negative for tremors and numbness other than noted  Psychiatric/Behavioral: Negative for decreased concentration or agitation other than above       Objective:   Physical Exam BP 120/68 mmHg  Pulse 95  Temp(Src) 98.4 F (36.9 C) (Oral)  Resp 20  Wt 154 lb (69.854 kg)  SpO2 98% VS noted,  Constitutional: Pt appears in no apparent distress HENT: Head: NCAT.  Right Ear: External ear normal.  Left Ear: External ear normal.  Eyes: . Pupils are equal, round, and reactive to light. Conjunctivae and EOM are normal Neck: Normal range of motion. Neck supple.  Cardiovascular: Normal rate and regular rhythm.   Pulmonary/Chest: Effort normal and breath sounds without rales or wheezing.  Abd:  Soft, NT, ND, + BS Neurological: Pt is alert. Not confused , motor grossly intact Skin: Skin is warm. No rash, no LE edema Psychiatric: Pt behavior is normal. No agitation.   Lab Results  Component Value Date   WBC 7.4 04/06/2015   HGB 13.0 04/06/2015   HCT 40.4 04/06/2015   PLT 183.0 04/06/2015   GLUCOSE 139* 04/06/2015   CHOL 138 04/06/2015   TRIG 103.0 04/06/2015   HDL  44.30 04/06/2015   LDLDIRECT 114.4 05/13/2012   LDLCALC 73 04/06/2015   ALT 11 04/06/2015   AST 16 04/06/2015   NA 141 04/06/2015   K 5.2* 04/06/2015   CL 103 04/06/2015   CREATININE 1.00 04/06/2015   BUN 18 04/06/2015   CO2 30 04/06/2015   TSH 4.74* 10/05/2014   INR 1.1* 10/17/2014   HGBA1C 7.5 06/12/2015   MICROALBUR 1.5 10/05/2014       Assessment & Plan:

## 2015-10-03 NOTE — Assessment & Plan Note (Signed)
stable overall by history and exam, recent data reviewed with pt, and pt to continue medical treatment as before,  to f/u any worsening symptoms or concerns BP Readings from Last 3 Encounters:  10/03/15 120/68  08/21/15 158/90  06/12/15 144/78

## 2015-10-03 NOTE — Assessment & Plan Note (Signed)
stable overall by history and exam, recent data reviewed with pt, and pt to continue medical treatment as before,  to f/u any worsening symptoms or concerns Lab Results  Component Value Date   LDLCALC 73 04/06/2015

## 2015-10-03 NOTE — Patient Instructions (Signed)
Please continue all other medications as before, and refills have been done if requested.  Please have the pharmacy call with any other refills you may need.  Please continue your efforts at being more active, low cholesterol diet, and weight control.  You are otherwise up to date with prevention measures today.  Please keep your appointments with your specialists as you may have planned  Please return in 6 months, or sooner if needed 

## 2015-10-03 NOTE — Assessment & Plan Note (Signed)
stable overall by history and exam, recent data reviewed with pt, and pt to continue medical treatment as before,  to f/u any worsening symptoms or concerns Lab Results  Component Value Date   WBC 7.4 04/06/2015   HGB 13.0 04/06/2015   HCT 40.4 04/06/2015   PLT 183.0 04/06/2015   GLUCOSE 139* 04/06/2015   CHOL 138 04/06/2015   TRIG 103.0 04/06/2015   HDL 44.30 04/06/2015   LDLDIRECT 114.4 05/13/2012   LDLCALC 73 04/06/2015   ALT 11 04/06/2015   AST 16 04/06/2015   NA 141 04/06/2015   K 5.2* 04/06/2015   CL 103 04/06/2015   CREATININE 1.00 04/06/2015   BUN 18 04/06/2015   CO2 30 04/06/2015   TSH 4.74* 10/05/2014   INR 1.1* 10/17/2014   HGBA1C 7.5 06/12/2015   MICROALBUR 1.5 10/05/2014

## 2015-10-03 NOTE — Progress Notes (Signed)
Pre visit review using our clinic review tool, if applicable. No additional management support is needed unless otherwise documented below in the visit note. 

## 2015-10-12 ENCOUNTER — Ambulatory Visit: Payer: Medicare Other | Admitting: Endocrinology

## 2015-10-12 DIAGNOSIS — Z0289 Encounter for other administrative examinations: Secondary | ICD-10-CM

## 2015-10-15 ENCOUNTER — Telehealth: Payer: Self-pay | Admitting: Endocrinology

## 2015-10-15 NOTE — Telephone Encounter (Signed)
Please come back for a follow-up appointment in 3 months 

## 2015-10-15 NOTE — Telephone Encounter (Signed)
Patient no showed today's appt. Please advise on how to follow up. °A. No follow up necessary. °B. Follow up urgent. Contact patient immediately. °C. Follow up necessary. Contact patient and schedule visit in ___ days. °D. Follow up advised. Contact patient and schedule visit in ____weeks. ° °

## 2015-10-15 NOTE — Telephone Encounter (Signed)
Jeffrey Kaiser, Could you please call and schedule? Thanks!

## 2015-10-16 NOTE — Telephone Encounter (Signed)
LVM for PT to call back.

## 2015-10-24 DIAGNOSIS — H3589 Other specified retinal disorders: Secondary | ICD-10-CM | POA: Diagnosis not present

## 2015-10-24 DIAGNOSIS — H401132 Primary open-angle glaucoma, bilateral, moderate stage: Secondary | ICD-10-CM | POA: Diagnosis not present

## 2015-10-24 DIAGNOSIS — H53431 Sector or arcuate defects, right eye: Secondary | ICD-10-CM | POA: Diagnosis not present

## 2015-11-01 ENCOUNTER — Other Ambulatory Visit: Payer: Self-pay | Admitting: Internal Medicine

## 2016-01-04 ENCOUNTER — Other Ambulatory Visit: Payer: Self-pay | Admitting: Cardiovascular Disease

## 2016-01-18 ENCOUNTER — Telehealth: Payer: Self-pay | Admitting: Emergency Medicine

## 2016-01-18 NOTE — Telephone Encounter (Signed)
Pt wants to know if he can be referred out to a male dermatologist. Thanks.

## 2016-01-18 NOTE — Telephone Encounter (Signed)
This can be done, such as referral to Dr Allyson Sabal, however I would need to ask why to place a reason for the referral, thanks

## 2016-01-22 NOTE — Telephone Encounter (Signed)
Called patient unable to reach left message to give us a call back.

## 2016-02-12 ENCOUNTER — Encounter: Payer: Self-pay | Admitting: Cardiovascular Disease

## 2016-02-15 ENCOUNTER — Ambulatory Visit: Payer: Medicare Other | Admitting: Endocrinology

## 2016-02-19 ENCOUNTER — Ambulatory Visit (INDEPENDENT_AMBULATORY_CARE_PROVIDER_SITE_OTHER): Payer: Medicare Other | Admitting: Cardiovascular Disease

## 2016-02-19 ENCOUNTER — Encounter: Payer: Self-pay | Admitting: Cardiovascular Disease

## 2016-02-19 VITALS — BP 120/82 | HR 94 | Ht 65.0 in | Wt 153.2 lb

## 2016-02-19 DIAGNOSIS — Z23 Encounter for immunization: Secondary | ICD-10-CM

## 2016-02-19 DIAGNOSIS — E784 Other hyperlipidemia: Secondary | ICD-10-CM | POA: Diagnosis not present

## 2016-02-19 DIAGNOSIS — I10 Essential (primary) hypertension: Secondary | ICD-10-CM | POA: Diagnosis not present

## 2016-02-19 DIAGNOSIS — I2510 Atherosclerotic heart disease of native coronary artery without angina pectoris: Secondary | ICD-10-CM

## 2016-02-19 DIAGNOSIS — I739 Peripheral vascular disease, unspecified: Secondary | ICD-10-CM

## 2016-02-19 DIAGNOSIS — L409 Psoriasis, unspecified: Secondary | ICD-10-CM | POA: Diagnosis not present

## 2016-02-19 DIAGNOSIS — E7849 Other hyperlipidemia: Secondary | ICD-10-CM

## 2016-02-19 NOTE — Patient Instructions (Signed)

## 2016-02-19 NOTE — Progress Notes (Signed)
Cardiology Office Note   Date:  02/19/2016   ID:  Jeffrey Kaiser, Jeffrey Kaiser Jan 29, 1926, MRN [id number]  PCP:  Cathlean Cower, MD  Cardiologist:   [name], MD   No chief complaint on file.     History of Present Illness: Jeffrey Kaiser is a 82 y.o. male who presents for a follow up visit regarding peripheral arterial disease and claudication. He has reported history of coronary artery disease with no available details, prolonged history of diabetes, hyperlipidemia, hypertension and arthritis. He has no history of tobacco use.  He was seen in 2016 for severe left calf claudication. Noninvasive vascular evaluation showed an ABI of 0.93 on the right and 0.31 on the left.  Angiography in 7/16  showed: 1. No significant aortoiliac disease. 2. Moderate distal left common femoral artery stenosis. Mild nonobstructive disease in the left SFA. Subtotal occlusion of the left popliteal artery proximally with 1 vessel runoff below the knee. 3. Mild nonobstructive disease affecting the right SFA and popliteal arteries. 2 vessel runoff below the knee on the right. The patient was treated medically with the addition of cilostazol with significant improvement in symptoms. Currently he has minimal left calf discomfort. He denies any chest pain or shortness of breath.   Past Medical History:  Diagnosis Date  . Anemia 09/04/2010  . Arthritis   . BPH (benign prostatic hyperplasia) 09/09/2010  . CAD (coronary artery disease)   . DDD (degenerative disc disease), cervical 09/04/2010  . Diabetes mellitus   . Diastolic dysfunction 99991111  . Diverticulitis   . Glaucoma   . Hernia   . History of colonic diverticulitis 09/04/2010  . Hyperlipidemia   . Hypertension   . Psoriasis 09/04/2010    Past Surgical History:  Procedure Laterality Date  . broken legs  1981 and 1995  . PERIPHERAL VASCULAR CATHETERIZATION N/A 10/25/2014   Procedure: Abdominal Aortogram;  Surgeon: Wellington Hampshire, MD;  Location: [facility];  Service: Cardiovascular;  Laterality: N/A;  . PERIPHERAL VASCULAR CATHETERIZATION Bilateral 10/25/2014   Procedure: Lower Extremity Angiography;  Surgeon: Wellington Hampshire, MD;  Location: [facility];  Service: Cardiovascular;  Laterality: Bilateral;  . s/p lipoma  2003  . TONSILLECTOMY  1952     Current Outpatient Prescriptions  Medication Sig Dispense Refill  . acetaminophen-codeine (TYLENOL #3) 300-30 MG per tablet Take 1 tablet by mouth every 6 (six) hours as needed for moderate pain. 60 tablet 0  . aspirin 81 MG tablet Take 81 mg by mouth daily.    Marland Kitchen atorvastatin (LIPITOR) 20 MG tablet TAKE 1 TABLET (20 MG TOTAL) BY MOUTH DAILY. 90 tablet 1  . bromocriptine (PARLODEL) 2.5 MG tablet Take 0.5 tablets (1.25 mg total) by mouth at bedtime. 45 tablet 2  . ciclopirox (LOPROX) 0.77 % cream Apply 1 application topically daily as needed.    . cilostazol (PLETAL) 100 MG tablet Take 1 tablet (100 mg total) by mouth 2 (two) times daily. 180 tablet 3  . clobetasol (TEMOVATE) 0.05 % external solution Apply topically 2 (two) times daily. 50 mL 1  . fosinopril (MONOPRIL) 20 MG tablet TAKE 1 TABLET (20 MG TOTAL) BY MOUTH DAILY. 90 tablet 2  . glucose blood (ONETOUCH VERIO) test strip 1 each by Other route daily. And lancets 1/day 250.00 100 each 12  . isosorbide mononitrate (IMDUR) 60 MG 24 hr tablet Take 0.5 tablets (30 mg total) by mouth daily. 45 tablet 3  . metFORMIN (GLUCOPHAGE) 500 MG  tablet Take 1 tablet (500 mg total) by mouth as directed. Take 2 tablets in the morning, 1 tablet at noon and 2 tablets at evening 150 tablet 5  . metoprolol succinate (TOPROL-XL) 50 MG 24 hr tablet TAKE 1 TABLET (50 MG TOTAL) BY MOUTH DAILY. TAKE WITH OR IMMEDIATELY FOLLOWING A MEAL. 90 tablet 3  . repaglinide (PRANDIN) 1 MG tablet TAKE 1 TABLET (1 MG TOTAL) BY MOUTH 3 (THREE) TIMES DAILY BEFORE MEALS. 90 tablet 3  . TRAVATAN Z 0.004 % SOLN ophthalmic solution Place 1 drop into both eyes at  bedtime.  4   No current facility-administered medications for this visit.     Allergies:   Levaquin [levofloxacin in d5w] and Tramadol    Social History:  The patient  reports that he has never smoked. He has never used smokeless tobacco. He reports that he does not drink alcohol or use drugs.   Family History:  The patient's family history includes Arthritis in his father and mother; Cancer in his other; Heart disease in his father and mother; Hypertension in his father and mother.    ROS:  Please see the history of present illness.   Otherwise, review of systems are positive for none.   All other systems are reviewed and negative.    PHYSICAL EXAM: VS:  BP 120/82   Pulse 94   Ht 5\' 5"  (1.651 m)   Wt 153 lb 3.2 oz (69.5 kg)   BMI 25.49 kg/m  , BMI Body mass index is 25.49 kg/m. GEN: Well nourished, well developed, in no acute distress  HEENT: normal  Neck: no JVD, carotid bruits, or masses Cardiac: RRR; no murmurs, rubs, or gallops,no edema  Respiratory:  clear to auscultation bilaterally, normal work of breathing GI: soft, nontender, nondistended, + BS MS: no deformity or atrophy  Skin: warm and dry, no rash Neuro:  Strength and sensation are intact Psych: euthymic mood, full affect   EKG:  EKG is not ordered today.   Recent Labs: 04/06/2015: ALT 11; BUN 18; Creatinine, Ser 1.00; Hemoglobin 13.0; Platelets 183.0; Potassium 5.2; Sodium 141    Lipid Panel    Component Value Date/Time   CHOL 138 04/06/2015 0904   TRIG 103.0 04/06/2015 0904   HDL 44.30 04/06/2015 0904   CHOLHDL 3 04/06/2015 0904   VLDL 20.6 04/06/2015 0904   LDLCALC 73 04/06/2015 0904   LDLDIRECT 114.4 05/13/2012 1417      Wt Readings from Last 3 Encounters:  02/19/16 153 lb 3.2 oz (69.5 kg)  10/03/15 154 lb (69.9 kg)  08/21/15 154 lb 12.8 oz (70.2 kg)       ASSESSMENT AND PLAN:  1. Peripheral arterial disease with claudication:  He has minimal claudication at this time. Continue  medical therapy. I discussed with him the importance of proper foot hygiene. He does have a bunion in the medial aspect of the left foot. It's slightly red but there is no ulceration. I advised him to use a bunion protector.  2. Hyperlipidemia: Continue treatment with atorvastatin. Most recent LDL was 73 December 2016 .   3. Coronary artery disease involving native coronary arteries without angina: Continue medical therapy.   4. Essential hypertension: Blood pressure is well controlled.  Flu shot was given today.   Disposition:   FU with me in 6 months  Signed,  [name], MD  02/19/2016 9:13 AM    [place]

## 2016-02-20 ENCOUNTER — Encounter: Payer: Self-pay | Admitting: Endocrinology

## 2016-02-20 ENCOUNTER — Ambulatory Visit (INDEPENDENT_AMBULATORY_CARE_PROVIDER_SITE_OTHER): Payer: Medicare Other | Admitting: Endocrinology

## 2016-02-20 VITALS — BP 132/84 | HR 89 | Ht 65.0 in | Wt 152.0 lb

## 2016-02-20 DIAGNOSIS — I2510 Atherosclerotic heart disease of native coronary artery without angina pectoris: Secondary | ICD-10-CM

## 2016-02-20 DIAGNOSIS — E1151 Type 2 diabetes mellitus with diabetic peripheral angiopathy without gangrene: Secondary | ICD-10-CM | POA: Diagnosis not present

## 2016-02-20 LAB — POCT GLYCOSYLATED HEMOGLOBIN (HGB A1C): Hemoglobin A1C: 7.6

## 2016-02-20 NOTE — Progress Notes (Signed)
Subjective:    Patient ID: Jeffrey Kaiser, male    DOB: [birth year], 82 y.o.   MRN: [id number]  HPI Pt returns for f/u of diabetes mellitus: DM type: 2 Dx'ed: [id number] Complications: retinopathy, renal insufficiency, CAD, and PAD. Therapy: 3 oral meds DKA: never. Severe hypoglycemia: never Pancreatitis: never.   Other: he has never been on insulin; invokana rx is limited by hyperkalemia.  Interval history: no cbg record, but states cbg's are well-controlled.  pt states he feels well in general.   Past Medical History:  Diagnosis Date  . Anemia 09/04/2010  . Arthritis   . BPH (benign prostatic hyperplasia) 09/09/2010  . CAD (coronary artery disease)   . DDD (degenerative disc disease), cervical 09/04/2010  . Diabetes mellitus   . Diastolic dysfunction 99991111  . Diverticulitis   . Glaucoma   . Hernia   . History of colonic diverticulitis 09/04/2010  . Hyperlipidemia   . Hypertension   . Psoriasis 09/04/2010    Past Surgical History:  Procedure Laterality Date  . broken legs  1981 and 1995  . PERIPHERAL VASCULAR CATHETERIZATION N/A 10/25/2014   Procedure: Abdominal Aortogram;  Surgeon: Wellington Hampshire, MD;  Location: [facility];  Service: Cardiovascular;  Laterality: N/A;  . PERIPHERAL VASCULAR CATHETERIZATION Bilateral 10/25/2014   Procedure: Lower Extremity Angiography;  Surgeon: Wellington Hampshire, MD;  Location: [facility];  Service: Cardiovascular;  Laterality: Bilateral;  . s/p lipoma  2003  . TONSILLECTOMY  1952    Social History   Social History  . Marital status: Single    Spouse name: N/A  . Number of children: N/A  . Years of education: N/A   Occupational History  . retired    Social History Main Topics  . Smoking status: Never Smoker  . Smokeless tobacco: Never Used  . Alcohol use No  . Drug use: No  . Sexual activity: Not on file   Other Topics Concern  . Not on file   Social History Narrative  . No narrative on file    Current  Outpatient Prescriptions on File Prior to Visit  Medication Sig Dispense Refill  . acetaminophen-codeine (TYLENOL #3) 300-30 MG per tablet Take 1 tablet by mouth every 6 (six) hours as needed for moderate pain. 60 tablet 0  . aspirin 81 MG tablet Take 81 mg by mouth daily.    Marland Kitchen atorvastatin (LIPITOR) 20 MG tablet TAKE 1 TABLET (20 MG TOTAL) BY MOUTH DAILY. 90 tablet 1  . bromocriptine (PARLODEL) 2.5 MG tablet Take 0.5 tablets (1.25 mg total) by mouth at bedtime. 45 tablet 2  . ciclopirox (LOPROX) 0.77 % cream Apply 1 application topically daily as needed.    . cilostazol (PLETAL) 100 MG tablet Take 1 tablet (100 mg total) by mouth 2 (two) times daily. 180 tablet 3  . clobetasol (TEMOVATE) 0.05 % external solution Apply topically 2 (two) times daily. 50 mL 1  . fosinopril (MONOPRIL) 20 MG tablet TAKE 1 TABLET (20 MG TOTAL) BY MOUTH DAILY. 90 tablet 2  . glucose blood (ONETOUCH VERIO) test strip 1 each by Other route daily. And lancets 1/day 250.00 100 each 12  . isosorbide mononitrate (IMDUR) 60 MG 24 hr tablet Take 0.5 tablets (30 mg total) by mouth daily. 45 tablet 3  . metFORMIN (GLUCOPHAGE) 500 MG tablet Take 1 tablet (500 mg total) by mouth as directed. Take 2 tablets in the morning, 1 tablet at noon and 2 tablets at evening 150 tablet  5  . metoprolol succinate (TOPROL-XL) 50 MG 24 hr tablet TAKE 1 TABLET (50 MG TOTAL) BY MOUTH DAILY. TAKE WITH OR IMMEDIATELY FOLLOWING A MEAL. 90 tablet 3  . repaglinide (PRANDIN) 1 MG tablet TAKE 1 TABLET (1 MG TOTAL) BY MOUTH 3 (THREE) TIMES DAILY BEFORE MEALS. 90 tablet 3  . TRAVATAN Z 0.004 % SOLN ophthalmic solution Place 1 drop into both eyes at bedtime.  4   No current facility-administered medications on file prior to visit.     Allergies  Allergen Reactions  . Levaquin [Levofloxacin In D5w] Nausea Only  . Tramadol Other (See Comments)    somnolence    Family History  Problem Relation Age of Onset  . Arthritis Mother   . Heart disease Mother    . Hypertension Mother   . Arthritis Father   . Heart disease Father   . Hypertension Father   . Cancer Other     lung cancer    BP 132/84   Pulse 89   Ht 5\' 5"  (1.651 m)   Wt 152 lb (68.9 kg)   SpO2 97%   BMI 25.29 kg/m    Review of Systems He denies hypoglycemia    Objective:   Physical Exam VITAL SIGNS:  See vs page GENERAL: no distress Pulses: dorsalis pedis intact bilat.   MSK: no deformity of the feet CV: 1+ bilat leg edema, and bilar vv's.     Skin:  no ulcer on the feet, but there is slight erythema on a left foot bunion.  normal color and temp on the feet. Neuro: sensation is intact to touch on the feet.   Ext: There is bilateral onychomycosis of the toenails.      Lab Results  Component Value Date   HGBA1C 7.6 02/20/2016   Lab Results  Component Value Date   CREATININE 1.00 04/06/2015   BUN 18 04/06/2015   NA 141 04/06/2015   K 5.2 (H) 04/06/2015   CL 103 04/06/2015   CO2 30 04/06/2015      Assessment & Plan:  Type 2 DM, with CAD. well-controlled, considering advanced age Erythema of the foot, new.  She is at risk for foot ulcer.  Renal insufficiency: this limits rx options.   Patient is advised the following: Patient Instructions  check your blood sugar once a day.  vary the time of day when you check, between before the 3 meals, and at bedtime.  also check if you have symptoms of your blood sugar being too high or too low.  please keep a record of the readings and bring it to your next appointment here.  You can write it on any piece of paper.  please call us sooner if your blood sugar goes below 70, or if you have a lot of readings over 200.   Please continue the same medications.    Carefully watch the red area of the left foot.  See Dr Jenny Reichmann if the skin breaks open, if the redness gets worse, or if the area gets swollen.   Please come back for a follow-up appointment in 4 months.

## 2016-02-20 NOTE — Patient Instructions (Addendum)
check your blood sugar once a day.  vary the time of day when you check, between before the 3 meals, and at bedtime.  also check if you have symptoms of your blood sugar being too high or too low.  please keep a record of the readings and bring it to your next appointment here.  You can write it on any piece of paper.  please call us sooner if your blood sugar goes below 70, or if you have a lot of readings over 200.   Please continue the same medications.    Carefully watch the red area of the left foot.  See Dr Jenny Reichmann if the skin breaks open, if the redness gets worse, or if the area gets swollen.   Please come back for a follow-up appointment in 4 months.

## 2016-02-25 DIAGNOSIS — E119 Type 2 diabetes mellitus without complications: Secondary | ICD-10-CM | POA: Diagnosis not present

## 2016-02-25 DIAGNOSIS — H401132 Primary open-angle glaucoma, bilateral, moderate stage: Secondary | ICD-10-CM | POA: Diagnosis not present

## 2016-02-25 DIAGNOSIS — H53432 Sector or arcuate defects, left eye: Secondary | ICD-10-CM | POA: Diagnosis not present

## 2016-02-25 DIAGNOSIS — H3589 Other specified retinal disorders: Secondary | ICD-10-CM | POA: Diagnosis not present

## 2016-03-11 DIAGNOSIS — E1142 Type 2 diabetes mellitus with diabetic polyneuropathy: Secondary | ICD-10-CM | POA: Diagnosis not present

## 2016-03-15 ENCOUNTER — Other Ambulatory Visit: Payer: Self-pay | Admitting: Internal Medicine

## 2016-03-21 ENCOUNTER — Other Ambulatory Visit: Payer: Self-pay | Admitting: Endocrinology

## 2016-03-25 ENCOUNTER — Telehealth: Payer: Self-pay

## 2016-03-25 ENCOUNTER — Encounter: Payer: Self-pay | Admitting: Internal Medicine

## 2016-03-25 ENCOUNTER — Ambulatory Visit (INDEPENDENT_AMBULATORY_CARE_PROVIDER_SITE_OTHER): Payer: Medicare Other | Admitting: Internal Medicine

## 2016-03-25 ENCOUNTER — Other Ambulatory Visit (INDEPENDENT_AMBULATORY_CARE_PROVIDER_SITE_OTHER): Payer: Medicare Other

## 2016-03-25 VITALS — BP 130/80 | HR 83 | Temp 98.5°F | Resp 20 | Wt 150.0 lb

## 2016-03-25 DIAGNOSIS — E7849 Other hyperlipidemia: Secondary | ICD-10-CM

## 2016-03-25 DIAGNOSIS — I2510 Atherosclerotic heart disease of native coronary artery without angina pectoris: Secondary | ICD-10-CM | POA: Diagnosis not present

## 2016-03-25 DIAGNOSIS — I10 Essential (primary) hypertension: Secondary | ICD-10-CM

## 2016-03-25 DIAGNOSIS — M5030 Other cervical disc degeneration, unspecified cervical region: Secondary | ICD-10-CM | POA: Diagnosis not present

## 2016-03-25 DIAGNOSIS — E784 Other hyperlipidemia: Secondary | ICD-10-CM

## 2016-03-25 LAB — BASIC METABOLIC PANEL
BUN: 22 mg/dL (ref 6–23)
CALCIUM: 9.6 mg/dL (ref 8.4–10.5)
CHLORIDE: 103 meq/L (ref 96–112)
CO2: 28 meq/L (ref 19–32)
Creatinine, Ser: 0.99 mg/dL (ref 0.40–1.50)
GFR: 75.7 mL/min (ref >60.00)
Glucose, Bld: 161 mg/dL — ABNORMAL HIGH (ref 70–99)
Potassium: 4.3 mEq/L (ref 3.5–5.1)
SODIUM: 140 meq/L (ref 135–145)

## 2016-03-25 LAB — HEPATIC FUNCTION PANEL
ALK PHOS: 52 U/L (ref 39–117)
ALT: 11 U/L (ref 0–53)
AST: 16 U/L (ref 0–37)
Albumin: 4.1 g/dL (ref 3.5–5.2)
BILIRUBIN DIRECT: 0.1 mg/dL (ref 0.0–0.3)
TOTAL PROTEIN: 7.3 g/dL (ref 6.0–8.3)
Total Bilirubin: 0.5 mg/dL (ref 0.2–1.2)

## 2016-03-25 LAB — URINALYSIS, ROUTINE W REFLEX MICROSCOPIC
Hgb urine dipstick: NEGATIVE
Leukocytes, UA: NEGATIVE
Nitrite: NEGATIVE
PH: 5.5 (ref 5.0–8.0)
RBC / HPF: NONE SEEN (ref 0–?)
Urine Glucose: NEGATIVE
Urobilinogen, UA: 1 (ref 0.0–1.0)
WBC, UA: NONE SEEN (ref 0–?)

## 2016-03-25 LAB — CBC WITH DIFFERENTIAL/PLATELET
BASOS ABS: 0 10*3/uL (ref 0.0–0.1)
BASOS PCT: 0.3 % (ref 0.0–3.0)
EOS PCT: 1.2 % (ref 0.0–5.0)
Eosinophils Absolute: 0.1 10*3/uL (ref 0.0–0.7)
HCT: 38.8 % — ABNORMAL LOW (ref 39.0–52.0)
Hemoglobin: 12.8 g/dL — ABNORMAL LOW (ref 13.0–17.0)
LYMPHS ABS: 1.7 10*3/uL (ref 0.7–4.0)
Lymphocytes Relative: 21 % (ref 12.0–46.0)
MCHC: 33.1 g/dL (ref 30.0–36.0)
MCV: 86.2 fl (ref 78.0–100.0)
MONOS PCT: 7 % (ref 3.0–12.0)
Monocytes Absolute: 0.6 10*3/uL (ref 0.1–1.0)
NEUTROS ABS: 5.7 10*3/uL (ref 1.4–7.7)
NEUTROS PCT: 70.5 % (ref 43.0–77.0)
PLATELETS: 187 10*3/uL (ref 150.0–400.0)
RBC: 4.51 Mil/uL (ref 4.22–5.81)
RDW: 14.8 % (ref 11.5–15.5)
WBC: 8 10*3/uL (ref 4.0–10.5)

## 2016-03-25 LAB — LIPID PANEL
Cholesterol: 141 mg/dL (ref 0–200)
HDL: 43 mg/dL (ref >39.00)
LDL Cholesterol: 78 mg/dL (ref 0–99)
NONHDL: 98.06
Total CHOL/HDL Ratio: 3
Triglycerides: 99 mg/dL (ref 0.0–149.0)
VLDL: 19.8 mg/dL (ref 0.0–40.0)

## 2016-03-25 LAB — TSH: TSH: 2.21 u[IU]/mL (ref 0.35–4.50)

## 2016-03-25 MED ORDER — FOSINOPRIL SODIUM 20 MG PO TABS: ORAL_TABLET | ORAL | 2 refills | Status: DC

## 2016-03-25 NOTE — Progress Notes (Signed)
Pre visit review using our clinic review tool, if applicable. No additional management support is needed unless otherwise documented below in the visit note. 

## 2016-03-25 NOTE — Telephone Encounter (Signed)
Medication refill sent to pharmacy for medication that patient requested

## 2016-03-25 NOTE — Progress Notes (Signed)
Subjective:    Patient ID: Jeffrey Kaiser, male    DOB: 1925/08/11, 82 y.o.   MRN: [id number]  HPI  Here to f/u; overall doing ok,  Pt denies chest pain, increasing sob or doe, wheezing, orthopnea, PND, increased LE swelling, palpitations, dizziness or syncope.  Pt denies new neurological symptoms such as new headache, or facial or extremity weakness or numbness.  Pt denies polydipsia, polyuria, or low sugar episode.   Pt denies new neurological symptoms such as new headache, or facial or extremity weakness or numbness.   Pt states overall good compliance with meds, mostly trying to follow appropriate diet. No other history changes except having some implant teeth done soon and wondering if this would lead to any further blood clots.  Has hx of diverticulitis, trying to avoid certain foods, has lost 5 lbs in last 6 mo  Wife is jealous he is losing more than she, and concerned it might be abnormal.   Wt Readings from Last 3 Encounters:  03/25/16 150 lb (68 kg)  02/20/16 152 lb (68.9 kg)  02/19/16 153 lb 3.2 oz (69.5 kg)  Has cont'd difficutly walking , tends to walk after only 100 ft tends to have neck getting weak and tends to walk with head down. Asks for handicap parking application signed. Has been never smoker. Denies urinary symptoms such as dysuria, frequency, urgency, flank pain, hematuria or n/v, fever, chills, though admits he has slower flow "but it gets there"  Conts's to see Dr Loanne Drilling for Dm  - last a1c just over 7.  Also mentions BP at oral surgeon was elevated but not sure of numbner.  BP Readings from Last 3 Encounters:  03/25/16 130/80  02/20/16 132/84  02/19/16 120/82   Past Medical History:  Diagnosis Date  . Anemia 09/04/2010  . Arthritis   . BPH (benign prostatic hyperplasia) 09/09/2010  . CAD (coronary artery disease)   . DDD (degenerative disc disease), cervical 09/04/2010  . Diabetes mellitus   . Diastolic dysfunction 99991111  . Diverticulitis   . Glaucoma   . Hernia    . History of colonic diverticulitis 09/04/2010  . Hyperlipidemia   . Hypertension   . Psoriasis 09/04/2010   Past Surgical History:  Procedure Laterality Date  . broken legs  1981 and 1995  . PERIPHERAL VASCULAR CATHETERIZATION N/A 10/25/2014   Procedure: Abdominal Aortogram;  Surgeon: Wellington Hampshire, MD;  Location: [place] [facility];  Service: Cardiovascular;  Laterality: N/A;  . PERIPHERAL VASCULAR CATHETERIZATION Bilateral 10/25/2014   Procedure: Lower Extremity Angiography;  Surgeon: Wellington Hampshire, MD;  Location: [place] [facility];  Service: Cardiovascular;  Laterality: Bilateral;  . s/p lipoma  2003  . TONSILLECTOMY  1952    reports that he has never smoked. He has never used smokeless tobacco. He reports that he does not drink alcohol or use drugs. family history includes Arthritis in his father and mother; Cancer in his other; Heart disease in his father and mother; Hypertension in his father and mother. Allergies  Allergen Reactions  . Levaquin [Levofloxacin In D5w] Nausea Only  . Tramadol Other (See Comments)    somnolence   Current Outpatient Prescriptions on File Prior to Visit  Medication Sig Dispense Refill  . acetaminophen-codeine (TYLENOL #3) 300-30 MG per tablet Take 1 tablet by mouth every 6 (six) hours as needed for moderate pain. 60 tablet 0  . aspirin 81 MG tablet Take 81 mg by mouth daily.    Marland Kitchen atorvastatin (  LIPITOR) 20 MG tablet TAKE 1 TABLET (20 MG TOTAL) BY MOUTH DAILY. 90 tablet 1  . bromocriptine (PARLODEL) 2.5 MG tablet TAKE 1/2 TABLET AT BEDTIME 45 tablet 0  . ciclopirox (LOPROX) 0.77 % cream Apply 1 application topically daily as needed.    . cilostazol (PLETAL) 100 MG tablet Take 1 tablet (100 mg total) by mouth 2 (two) times daily. 180 tablet 3  . clobetasol (TEMOVATE) 0.05 % external solution Apply topically 2 (two) times daily. 50 mL 1  . glucose blood (ONETOUCH VERIO) test strip 1 each by Other route daily. And lancets 1/day 250.00 100 each 12    . isosorbide mononitrate (IMDUR) 60 MG 24 hr tablet Take 0.5 tablets (30 mg total) by mouth daily. 45 tablet 3  . metFORMIN (GLUCOPHAGE) 500 MG tablet Take 1 tablet (500 mg total) by mouth as directed. Take 2 tablets in the morning, 1 tablet at noon and 2 tablets at evening 150 tablet 5  . metoprolol succinate (TOPROL-XL) 50 MG 24 hr tablet TAKE 1 TABLET (50 MG TOTAL) BY MOUTH DAILY. TAKE WITH OR IMMEDIATELY FOLLOWING A MEAL. 90 tablet 3  . repaglinide (PRANDIN) 1 MG tablet TAKE 1 TABLET BY MOUTH 3 TIMES A DAY BEFORE MEALS 90 tablet 2  . TRAVATAN Z 0.004 % SOLN ophthalmic solution Place 1 drop into both eyes at bedtime.  4   No current facility-administered medications on file prior to visit.    Review of Systems  Constitutional: Negative for unusual diaphoresis or night sweats HENT: Negative for ear swelling or discharge Eyes: Negative for worsening visual haziness  Respiratory: Negative for choking and stridor.   Gastrointestinal: Negative for distension or worsening eructation Genitourinary: Negative for retention or change in urine volume.  Musculoskeletal: Negative for other MSK pain or swelling Skin: Negative for color change and worsening wound Neurological: Negative for tremors and numbness other than noted  Psychiatric/Behavioral: Negative for decreased concentration or agitation other than above   All other system neg per pt    Objective:   Physical Exam BP 130/80   Pulse 83   Temp 98.5 F (36.9 C) (Oral)   Resp 20   Wt 150 lb (68 kg)   SpO2 97%   BMI 24.96 kg/m  VS noted,  Constitutional: Pt appears in no apparent distress HENT: Head: NCAT.  Right Ear: External ear normal.  Left Ear: External ear normal.  Eyes: . Pupils are equal, round, and reactive to light. Conjunctivae and EOM are normal Neck: Normal range of motion. Neck supple.  Cardiovascular: Normal rate and regular rhythm.   Pulmonary/Chest: Effort normal and breath sounds without rales or wheezing.   Abd:  Soft, NT, ND, + BS Neurological: Pt is alert. Not confused , motor grossly intact Skin: Skin is warm. No rash, no LE edema Psychiatric: Pt behavior is normal. No agitation.  Marked cervical lordosis    Assessment & Plan:

## 2016-03-25 NOTE — Patient Instructions (Addendum)

## 2016-03-30 NOTE — Assessment & Plan Note (Signed)
With ongoing pain, for handicap parking application as requested

## 2016-03-30 NOTE — Assessment & Plan Note (Signed)
stable overall by history and exam, recent data reviewed with pt, and pt to continue medical treatment as before,  to f/u any worsening symptoms or concerns Lab Results  Component Value Date   LDLCALC 78 03/25/2016    

## 2016-03-30 NOTE — Assessment & Plan Note (Signed)
stable overall by history and exam, recent data reviewed with pt, and pt to continue medical treatment as before,  to f/u any worsening symptoms or concerns BP Readings from Last 3 Encounters:  03/25/16 130/80  02/20/16 132/84  02/19/16 120/82

## 2016-04-11 ENCOUNTER — Telehealth: Payer: Self-pay | Admitting: Cardiovascular Disease

## 2016-04-11 NOTE — Telephone Encounter (Signed)
I spoke with Elmyra Ricks and she has faxed request on 12/19 and 12/28.  I made her aware that Dr Fletcher Anon has not been in the [place] office since 03/18/16 but is scheduled to return 04/15/16.  She will fax request to the [facility] office so that I can have clearance to review on 04/15/16.

## 2016-04-11 NOTE — Telephone Encounter (Signed)
New message      Calling to get update on clearance faxed to [place] a couple of weeks ago

## 2016-04-15 NOTE — Telephone Encounter (Signed)
Dr Fletcher Anon completed clearance request and I will fax this to [phone]. Dr Fletcher Anon advised that pt is cleared for oral surgery and can hold pletal 2 days before and resume 1 day after.

## 2016-04-27 DIAGNOSIS — R05 Cough: Secondary | ICD-10-CM | POA: Diagnosis not present

## 2016-04-27 DIAGNOSIS — J209 Acute bronchitis, unspecified: Secondary | ICD-10-CM | POA: Diagnosis not present

## 2016-05-20 ENCOUNTER — Encounter: Payer: Self-pay | Admitting: Cardiovascular Disease

## 2016-05-20 ENCOUNTER — Ambulatory Visit (INDEPENDENT_AMBULATORY_CARE_PROVIDER_SITE_OTHER): Payer: Medicare Other | Admitting: Cardiovascular Disease

## 2016-05-20 ENCOUNTER — Ambulatory Visit (HOSPITAL_COMMUNITY)
Admission: RE | Admit: 2016-05-20 | Discharge: 2016-05-20 | Disposition: A | Discharge status: Home / Self Care | Payer: Medicare Other | Source: Ambulatory Visit | Attending: Cardiovascular Disease | Admitting: Cardiovascular Disease

## 2016-05-20 ENCOUNTER — Encounter (HOSPITAL_COMMUNITY): Payer: Self-pay

## 2016-05-20 VITALS — BP 170/70 | HR 67 | Ht 65.0 in | Wt 151.0 lb

## 2016-05-20 DIAGNOSIS — E785 Hyperlipidemia, unspecified: Secondary | ICD-10-CM | POA: Insufficient documentation

## 2016-05-20 DIAGNOSIS — I739 Peripheral vascular disease, unspecified: Secondary | ICD-10-CM | POA: Diagnosis not present

## 2016-05-20 DIAGNOSIS — I2510 Atherosclerotic heart disease of native coronary artery without angina pectoris: Secondary | ICD-10-CM

## 2016-05-20 DIAGNOSIS — E1151 Type 2 diabetes mellitus with diabetic peripheral angiopathy without gangrene: Secondary | ICD-10-CM | POA: Diagnosis not present

## 2016-05-20 DIAGNOSIS — I10 Essential (primary) hypertension: Secondary | ICD-10-CM | POA: Diagnosis not present

## 2016-05-20 DIAGNOSIS — M7989 Other specified soft tissue disorders: Secondary | ICD-10-CM

## 2016-05-20 NOTE — Patient Instructions (Signed)
Medication Instructions:  Your physician recommends that you continue on your current medications as directed. Please refer to the Current Medication list given to you today.  Labwork: No new orders.   Testing/Procedures: Your physician has requested that you have a LEFT lower extremity venous duplex ASAP. This test is an ultrasound of the veins in the legs.  It looks at venous blood flow that carries blood from the heart to the legs. Allow one hour for a Lower Venous exam. There are no restrictions or special instructions.  Follow-Up: Your physician recommends that you schedule a follow-up appointment in: 2 MONTHS with Dr Fletcher Anon    Any Other Special Instructions Will Be Listed Below (If Applicable).     If you need a refill on your cardiac medications before your next appointment, please call your pharmacy.

## 2016-05-20 NOTE — Progress Notes (Signed)
Cardiology Office Note   Date:  05/20/2016   ID:  Shooter, Villapando 05-01-1925, MRN [id number]  PCP:  Cathlean Cower, MD  Cardiologist:   [name], MD   Chief Complaint  Patient presents with  . Follow-up    left foot swelling       History of Present Illness: Jeffrey Kaiser is a 83 y.o. male who presents for a follow up visit regarding peripheral arterial disease and claudication. He has reported history of coronary artery disease with no available details, prolonged history of diabetes, hyperlipidemia, hypertension and arthritis. He has no history of tobacco use.  He was seen in 2016 for severe left calf claudication. Noninvasive vascular evaluation showed an ABI of 0.93 on the right and 0.31 on the left.  Angiography in 7/16  showed: 1. No significant aortoiliac disease. 2. Moderate distal left common femoral artery stenosis. Mild nonobstructive disease in the left SFA. Subtotal occlusion of the left popliteal artery proximally with 1 vessel runoff below the knee. 3. Mild nonobstructive disease affecting the right SFA and popliteal arteries. 2 vessel runoff below the knee on the right. The patient was treated medically with the addition of cilostazol with significant improvement in symptoms.   He is here today for swelling involving the left lower extremity mostly the foot. This started a few weeks ago. He has no pain in that area. There is no ulceration. He had recent bronchitis that has improved.  Past Medical History:  Diagnosis Date  . Anemia 09/04/2010  . Arthritis   . BPH (benign prostatic hyperplasia) 09/09/2010  . CAD (coronary artery disease)   . DDD (degenerative disc disease), cervical 09/04/2010  . Diabetes mellitus   . Diastolic dysfunction 99991111  . Diverticulitis   . Glaucoma   . Hernia   . History of colonic diverticulitis 09/04/2010  . Hyperlipidemia   . Hypertension   . Psoriasis 09/04/2010    Past Surgical History:  Procedure Laterality Date    . broken legs  1981 and 1995  . PERIPHERAL VASCULAR CATHETERIZATION N/A 10/25/2014   Procedure: Abdominal Aortogram;  Surgeon: Wellington Hampshire, MD;  Location: [facility] [place] [facility];  Service: Cardiovascular;  Laterality: N/A;  . PERIPHERAL VASCULAR CATHETERIZATION Bilateral 10/25/2014   Procedure: Lower Extremity Angiography;  Surgeon: Wellington Hampshire, MD;  Location: [place] [facility];  Service: Cardiovascular;  Laterality: Bilateral;  . s/p lipoma  2003  . TONSILLECTOMY  1952     Current Outpatient Prescriptions  Medication Sig Dispense Refill  . acetaminophen-codeine (TYLENOL #3) 300-30 MG per tablet Take 1 tablet by mouth every 6 (six) hours as needed for moderate pain. 60 tablet 0  . aspirin 81 MG tablet Take 81 mg by mouth daily.    Marland Kitchen atorvastatin (LIPITOR) 20 MG tablet TAKE 1 TABLET (20 MG TOTAL) BY MOUTH DAILY. 90 tablet 1  . bromocriptine (PARLODEL) 2.5 MG tablet TAKE 1/2 TABLET AT BEDTIME 45 tablet 0  . ciclopirox (LOPROX) 0.77 % cream Apply 1 application topically daily as needed.    . cilostazol (PLETAL) 100 MG tablet Take 1 tablet (100 mg total) by mouth 2 (two) times daily. 180 tablet 3  . clobetasol (TEMOVATE) 0.05 % external solution Apply topically 2 (two) times daily. 50 mL 1  . fosinopril (MONOPRIL) 20 MG tablet TAKE 1 TABLET (20 MG TOTAL) BY MOUTH DAILY. 90 tablet 2  . glucose blood (ONETOUCH VERIO) test strip 1 each by Other route daily. And lancets 1/day 250.00  100 each 12  . isosorbide mononitrate (IMDUR) 60 MG 24 hr tablet Take 0.5 tablets (30 mg total) by mouth daily. 45 tablet 3  . metFORMIN (GLUCOPHAGE) 500 MG tablet Take 1 tablet (500 mg total) by mouth as directed. Take 2 tablets in the morning, 1 tablet at noon and 2 tablets at evening 150 tablet 5  . metoprolol succinate (TOPROL-XL) 50 MG 24 hr tablet TAKE 1 TABLET (50 MG TOTAL) BY MOUTH DAILY. TAKE WITH OR IMMEDIATELY FOLLOWING A MEAL. 90 tablet 3  . repaglinide (PRANDIN) 1 MG tablet TAKE 1 TABLET BY MOUTH 3  TIMES A DAY BEFORE MEALS 90 tablet 2  . TRAVATAN Z 0.004 % SOLN ophthalmic solution Place 1 drop into both eyes at bedtime.  4   No current facility-administered medications for this visit.     Allergies:   Levaquin [levofloxacin in d5w] and Tramadol    Social History:  The patient  reports that he has never smoked. He has never used smokeless tobacco. He reports that he does not drink alcohol or use drugs.   Family History:  The patient's family history includes Arthritis in his father and mother; Cancer in his other; Heart disease in his father and mother; Hypertension in his father and mother.    ROS:  Please see the history of present illness.   Otherwise, review of systems are positive for none.   All other systems are reviewed and negative.    PHYSICAL EXAM: VS:  BP (!) 170/70 (BP Location: Right Arm, Patient Position: Sitting, Cuff Size: Normal)   Pulse 67   Ht 5\' 5"  (1.651 m)   Wt 151 lb (68.5 kg)   BMI 25.13 kg/m  , BMI Body mass index is 25.13 kg/m. GEN: Well nourished, well developed, in no acute distress  HEENT: normal  Neck: no JVD, carotid bruits, or masses Cardiac: RRR; no murmurs, rubs, or gallops,no edema  Respiratory:  clear to auscultation bilaterally, normal work of breathing GI: soft, nontender, nondistended, + BS MS: no deformity or atrophy  Skin: warm and dry, no rash Neuro:  Strength and sensation are intact Psych: euthymic mood, full affect Distal pulses are not palpable. There is asymmetrical swelling of bilateral legs worse on the left side. There is stasis dermatitis on the left. There is a red area on the medial aspect of the left foot but it's not warm.   EKG:  EKG is ordered today. Normal sinus rhythm with T-wave changes in the anterolateral leads suggestive of ischemia.   Recent Labs: 03/25/2016: ALT 11; BUN 22; Creatinine, Ser 0.99; Hemoglobin 12.8; Platelets 187.0; Potassium 4.3; Sodium 140; TSH 2.21    Lipid Panel    Component Value  Date/Time   CHOL 141 03/25/2016 1022   TRIG 99.0 03/25/2016 1022   HDL 43.00 03/25/2016 1022   CHOLHDL 3 03/25/2016 1022   VLDL 19.8 03/25/2016 1022   LDLCALC 78 03/25/2016 1022   LDLDIRECT 114.4 05/13/2012 1417      Wt Readings from Last 3 Encounters:  05/20/16 151 lb (68.5 kg)  03/25/16 150 lb (68 kg)  02/20/16 152 lb (68.9 kg)       ASSESSMENT AND PLAN:  1. Left leg swelling without evidence of cellulitis. He has no significant pain in that area. I think we should rule out DVT and thus I requested lower extremity venous duplex. If this comes back normal, the swelling might be related to chronic venous insufficiency. I advised him to elevate his leg frequently during  the day. He does have significant peripheral arterial disease affecting that side but currently has no open wounds or ulcerations. He has no significant pain to suggest critical limb ischemia. Continue close monitoring.  2. Peripheral arterial disease with claudication:  He has minimal claudication at this time. Continue medical therapy.  3. Hyperlipidemia: Continue treatment with atorvastatin. Most recent LDL was 73 December 2016 .   4. Coronary artery disease involving native coronary arteries without angina: Continue medical therapy.   5. Essential hypertension: Blood pressure is elevated today. continue close monitoring.    Disposition:   FU with me in 2 months  Signed,  [name], MD  05/20/2016 9:32 AM    [facility]

## 2016-05-20 NOTE — Progress Notes (Signed)
Today's left lower extremity venous duplex is negative. Preliminary results given to Dr. Fletcher Anon.

## 2016-05-26 ENCOUNTER — Telehealth: Payer: Self-pay | Admitting: Cardiovascular Disease

## 2016-05-26 NOTE — Telephone Encounter (Signed)
New message    Pt daughter is calling.  Pt c/o swelling: STAT is pt has developed SOB within 24 hours  1. How long have you been experiencing swelling? Saw Dr. Fletcher Anon last week-going on a couple weeks before that.  2. Where is the swelling located? Foot-left  3.  Are you currently taking a "fluid pill"? Daughter not sure.  4.  Are you currently SOB? No  5.  Have you traveled recently? No  Pt and daughter were advised to call back if it had not gone away.

## 2016-05-26 NOTE — Telephone Encounter (Signed)
Spoke to daughter. She states patient wanted to update Dr Fletcher Anon of his condition, since last visit.  Patient is still having swelling left ankle ,foot greater than the right foot. It gone down some but still swollen.  No pain noted,soreness or redness   Patient is propping legs during the day.  Swelling is still present when he wakes up in the morning.    patient is not using support/compression socks  Daughter aware will defer to Dr Fletcher Anon and contact with further information

## 2016-05-27 NOTE — Telephone Encounter (Signed)
I spoke with the pt's daughter (per DPR) and made her aware of Dr Tyrell Antonio recommendations. She will attempt to locate knee high low pressure stockings OTC to be applied during the day.  If she has trouble locating them then she will contact the office for prescription.

## 2016-05-27 NOTE — Telephone Encounter (Signed)
Doppler showed no evidence of DVT. Continue leg elevation. Given significant peripheral arterial disease, I am hesitant to prescribe knee-high support stockings but we can try the one with very low pressure to be applied during the day only.

## 2016-05-28 ENCOUNTER — Encounter (HOSPITAL_COMMUNITY): Payer: Medicare Other

## 2016-06-19 ENCOUNTER — Encounter: Payer: Self-pay | Admitting: Endocrinology

## 2016-06-19 ENCOUNTER — Other Ambulatory Visit: Payer: Self-pay | Admitting: Endocrinology

## 2016-06-19 ENCOUNTER — Ambulatory Visit (INDEPENDENT_AMBULATORY_CARE_PROVIDER_SITE_OTHER): Payer: Medicare Other | Admitting: Endocrinology

## 2016-06-19 VITALS — BP 142/82 | HR 85 | Ht 65.0 in | Wt 150.0 lb

## 2016-06-19 DIAGNOSIS — E1151 Type 2 diabetes mellitus with diabetic peripheral angiopathy without gangrene: Secondary | ICD-10-CM | POA: Diagnosis not present

## 2016-06-19 DIAGNOSIS — I2510 Atherosclerotic heart disease of native coronary artery without angina pectoris: Secondary | ICD-10-CM

## 2016-06-19 LAB — POCT GLYCOSYLATED HEMOGLOBIN (HGB A1C): Hemoglobin A1C: 6.9

## 2016-06-19 MED ORDER — REPAGLINIDE 0.5 MG PO TABS: 0.5000 mg | ORAL_TABLET | Freq: Three times a day (TID) | ORAL | 11 refills | Status: DC

## 2016-06-19 NOTE — Progress Notes (Signed)
Subjective:    Patient ID: Jeffrey Kaiser, male    DOB: 13-Dec-1925, 83 y.o.   MRN: [id number]  HPI Pt returns for f/u of diabetes mellitus:  DM type: 2 Dx'ed: 0263 Complications: retinopathy, renal insufficiency, CAD, and PAD. Therapy: 3 oral meds DKA: never. Severe hypoglycemia: never Pancreatitis: never.   Other: he has never been on insulin; invokana rx is limited by hyperkalemia.  Interval history: no cbg record, but states cbg's are well-controlled.  pt states he feels well in general.  Past Medical History:  Diagnosis Date  . Anemia 09/04/2010  . Arthritis   . BPH (benign prostatic hyperplasia) 09/09/2010  . CAD (coronary artery disease)   . DDD (degenerative disc disease), cervical 09/04/2010  . Diabetes mellitus   . Diastolic dysfunction 7/85/8850  . Diverticulitis   . Glaucoma   . Hernia   . History of colonic diverticulitis 09/04/2010  . Hyperlipidemia   . Hypertension   . Psoriasis 09/04/2010    Past Surgical History:  Procedure Laterality Date  . broken legs  1981 and 1995  . PERIPHERAL VASCULAR CATHETERIZATION N/A 10/25/2014   Procedure: Abdominal Aortogram;  Surgeon: Wellington Hampshire, MD;  Location: [place] [facility];  Service: Cardiovascular;  Laterality: N/A;  . PERIPHERAL VASCULAR CATHETERIZATION Bilateral 10/25/2014   Procedure: Lower Extremity Angiography;  Surgeon: Wellington Hampshire, MD;  Location: [place] [facility];  Service: Cardiovascular;  Laterality: Bilateral;  . s/p lipoma  2003  . TONSILLECTOMY  1952    Social History   Social History  . Marital status: Single    Spouse name: N/A  . Number of children: N/A  . Years of education: N/A   Occupational History  . retired    Social History Main Topics  . Smoking status: Never Smoker  . Smokeless tobacco: Never Used  . Alcohol use No  . Drug use: No  . Sexual activity: Not on file   Other Topics Concern  . Not on file   Social History Narrative  . No narrative on file    Current  Outpatient Prescriptions on File Prior to Visit  Medication Sig Dispense Refill  . acetaminophen-codeine (TYLENOL #3) 300-30 MG per tablet Take 1 tablet by mouth every 6 (six) hours as needed for moderate pain. 60 tablet 0  . aspirin 81 MG tablet Take 81 mg by mouth daily.    Marland Kitchen atorvastatin (LIPITOR) 20 MG tablet TAKE 1 TABLET (20 MG TOTAL) BY MOUTH DAILY. 90 tablet 1  . ciclopirox (LOPROX) 0.77 % cream Apply 1 application topically daily as needed.    . cilostazol (PLETAL) 100 MG tablet Take 1 tablet (100 mg total) by mouth 2 (two) times daily. 180 tablet 3  . clobetasol (TEMOVATE) 0.05 % external solution Apply topically 2 (two) times daily. 50 mL 1  . fosinopril (MONOPRIL) 20 MG tablet TAKE 1 TABLET (20 MG TOTAL) BY MOUTH DAILY. 90 tablet 2  . glucose blood (ONETOUCH VERIO) test strip 1 each by Other route daily. And lancets 1/day 250.00 100 each 12  . isosorbide mononitrate (IMDUR) 60 MG 24 hr tablet Take 0.5 tablets (30 mg total) by mouth daily. 45 tablet 3  . metFORMIN (GLUCOPHAGE) 500 MG tablet Take 1 tablet (500 mg total) by mouth as directed. Take 2 tablets in the morning, 1 tablet at noon and 2 tablets at evening 150 tablet 5  . metoprolol succinate (TOPROL-XL) 50 MG 24 hr tablet TAKE 1 TABLET (50 MG TOTAL) BY MOUTH DAILY.  TAKE WITH OR IMMEDIATELY FOLLOWING A MEAL. 90 tablet 3  . TRAVATAN Z 0.004 % SOLN ophthalmic solution Place 1 drop into both eyes at bedtime.  4   No current facility-administered medications on file prior to visit.     Allergies  Allergen Reactions  . Levaquin [Levofloxacin In D5w] Nausea Only  . Tramadol Other (See Comments)    somnolence    Family History  Problem Relation Age of Onset  . Arthritis Mother   . Heart disease Mother   . Hypertension Mother   . Arthritis Father   . Heart disease Father   . Hypertension Father   . Cancer Other     lung cancer    BP (!) 142/82   Pulse 85   Ht 5\' 5"  (1.651 m)   Wt 150 lb (68 kg)   SpO2 97%   BMI  24.96 kg/m   Review of Systems He denies hypoglycemia    Objective:   Physical Exam VITAL SIGNS:  See vs page GENERAL: no distress Pulses: dorsalis pedis intact bilat.   MSK: no deformity of the feet CV: trace bilat leg edema, and bilat vv's.     Skin:  no ulcer on the feet.  normal color and temp on the feet. Neuro: sensation is intact to touch on the feet.   Ext: There is bilateral onychomycosis of the toenails.   Lab Results  Component Value Date   CREATININE 0.99 03/25/2016   BUN 22 03/25/2016   NA 140 03/25/2016   K 4.3 03/25/2016   CL 103 03/25/2016   CO2 28 03/25/2016   a1c=6.9%    Assessment & Plan:  Type 2 DM: overcontrolled.  Frail elderly state: he is not a candidate for aggressive glycemic control.   Patient is advised the following: Patient Instructions  check your blood sugar once a day.  vary the time of day when you check, between before the 3 meals, and at bedtime.  also check if you have symptoms of your blood sugar being too high or too low.  please keep a record of the readings and bring it to your next appointment here.  You can write it on any piece of paper.  please call us sooner if your blood sugar goes below 70, or if you have a lot of readings over 200.   Please reduce the repaglinide to a smaller strength.  I have sent a prescription to your pharmacy.   Please continue the same other diabetes medications.   Please come back for a follow-up appointment in 4 months.

## 2016-06-19 NOTE — Patient Instructions (Addendum)
check your blood sugar once a day.  vary the time of day when you check, between before the 3 meals, and at bedtime.  also check if you have symptoms of your blood sugar being too high or too low.  please keep a record of the readings and bring it to your next appointment here.  You can write it on any piece of paper.  please call us sooner if your blood sugar goes below 70, or if you have a lot of readings over 200.   Please reduce the repaglinide to a smaller strength.  I have sent a prescription to your pharmacy.   Please continue the same other diabetes medications.   Please come back for a follow-up appointment in 4 months.

## 2016-07-15 ENCOUNTER — Ambulatory Visit (INDEPENDENT_AMBULATORY_CARE_PROVIDER_SITE_OTHER): Payer: Medicare Other | Admitting: Cardiovascular Disease

## 2016-07-15 VITALS — BP 198/88 | HR 68 | Ht 65.0 in | Wt 146.0 lb

## 2016-07-15 DIAGNOSIS — I2510 Atherosclerotic heart disease of native coronary artery without angina pectoris: Secondary | ICD-10-CM | POA: Diagnosis not present

## 2016-07-15 DIAGNOSIS — E784 Other hyperlipidemia: Secondary | ICD-10-CM | POA: Diagnosis not present

## 2016-07-15 DIAGNOSIS — I10 Essential (primary) hypertension: Secondary | ICD-10-CM | POA: Diagnosis not present

## 2016-07-15 DIAGNOSIS — I872 Venous insufficiency (chronic) (peripheral): Secondary | ICD-10-CM

## 2016-07-15 DIAGNOSIS — E7849 Other hyperlipidemia: Secondary | ICD-10-CM

## 2016-07-15 DIAGNOSIS — I739 Peripheral vascular disease, unspecified: Secondary | ICD-10-CM | POA: Diagnosis not present

## 2016-07-15 NOTE — Patient Instructions (Signed)
Medication Instructions: No changes.   Labwork: None.   Procedures/Testing: None.   Follow-Up: 6 months with Dr. Promyse Ardito.   Any Additional Special Instructions Will Be Listed Below (If Applicable).     If you need a refill on your cardiac medications before your next appointment, please call your pharmacy.   

## 2016-07-15 NOTE — Progress Notes (Signed)
Cardiology Office Note   Date:  07/15/2016   ID:  Jeffrey Kaiser, Jeffrey Kaiser 18-Mar-1926, MRN [id number]  PCP:  Cathlean Cower, MD  Cardiologist:   [name], MD   Chief Complaint  Patient presents with  . Follow-up    no pain in legs ocassional cramping in legs, No edema, chest pain, shortness of breath, lightheaded or dizziness      History of Present Illness: Jeffrey Kaiser is a 83 y.o. male who presents for a follow up visit regarding peripheral arterial disease and claudication. He has reported history of coronary artery disease with no available details, prolonged history of diabetes, hyperlipidemia, hypertension and arthritis. He has no history of tobacco use.  He was seen in 2016 for severe left calf claudication. Noninvasive vascular evaluation showed an ABI of 0.93 on the right and 0.31 on the left.  Angiography in 7/16  showed: 1. No significant aortoiliac disease. 2. Moderate distal left common femoral artery stenosis. Mild nonobstructive disease in the left SFA. Subtotal occlusion of the left popliteal artery proximally with 1 vessel runoff below the knee. 3. Mild nonobstructive disease affecting the right SFA and popliteal arteries. 2 vessel runoff below the knee on the right. The patient was treated medically with the addition of cilostazol with significant improvement in symptoms.   He was seen 2 months ago for left leg swelling which was felt to be due to chronic venous insufficiency. Lower extremity venous duplex showed no evidence of DVT. The swelling improved with leg elevation. He is doing reasonably well and denies any new symptoms.  Past Medical History:  Diagnosis Date  . Anemia 09/04/2010  . Arthritis   . BPH (benign prostatic hyperplasia) 09/09/2010  . CAD (coronary artery disease)   . DDD (degenerative disc disease), cervical 09/04/2010  . Diabetes mellitus   . Diastolic dysfunction 07/14/8117  . Diverticulitis   . Glaucoma   . Hernia   . History of colonic  diverticulitis 09/04/2010  . Hyperlipidemia   . Hypertension   . Psoriasis 09/04/2010    Past Surgical History:  Procedure Laterality Date  . broken legs  1981 and 1995  . PERIPHERAL VASCULAR CATHETERIZATION N/A 10/25/2014   Procedure: Abdominal Aortogram;  Surgeon: Wellington Hampshire, MD;  Location: [facility];  Service: Cardiovascular;  Laterality: N/A;  . PERIPHERAL VASCULAR CATHETERIZATION Bilateral 10/25/2014   Procedure: Lower Extremity Angiography;  Surgeon: Wellington Hampshire, MD;  Location: [facility];  Service: Cardiovascular;  Laterality: Bilateral;  . s/p lipoma  2003  . TONSILLECTOMY  1952     Current Outpatient Prescriptions  Medication Sig Dispense Refill  . acetaminophen-codeine (TYLENOL #3) 300-30 MG per tablet Take 1 tablet by mouth every 6 (six) hours as needed for moderate pain. 60 tablet 0  . aspirin 81 MG tablet Take 81 mg by mouth daily.    Marland Kitchen atorvastatin (LIPITOR) 20 MG tablet TAKE 1 TABLET (20 MG TOTAL) BY MOUTH DAILY. 90 tablet 1  . bromocriptine (PARLODEL) 2.5 MG tablet TAKE 1/2 TABLET AT BEDTIME 45 tablet 0  . ciclopirox (LOPROX) 0.77 % cream Apply 1 application topically daily as needed.    . cilostazol (PLETAL) 100 MG tablet Take 1 tablet (100 mg total) by mouth 2 (two) times daily. 180 tablet 3  . clobetasol (TEMOVATE) 0.05 % external solution Apply topically 2 (two) times daily. 50 mL 1  . fosinopril (MONOPRIL) 20 MG tablet TAKE 1 TABLET (20 MG TOTAL) BY MOUTH DAILY. [place]  tablet 2  . glucose blood (ONETOUCH VERIO) test strip 1 each by Other route daily. And lancets 1/day 250.00 100 each 12  . isosorbide mononitrate (IMDUR) 60 MG 24 hr tablet Take 0.5 tablets (30 mg total) by mouth daily. 45 tablet 3  . metFORMIN (GLUCOPHAGE) 500 MG tablet Take 1 tablet (500 mg total) by mouth as directed. Take 2 tablets in the morning, 1 tablet at noon and 2 tablets at evening 150 tablet 5  . metoprolol succinate (TOPROL-XL) 50 MG 24 hr tablet TAKE 1 TABLET (50 MG  TOTAL) BY MOUTH DAILY. TAKE WITH OR IMMEDIATELY FOLLOWING A MEAL. 90 tablet 3  . repaglinide (PRANDIN) 0.5 MG tablet Take 1 tablet (0.5 mg total) by mouth 3 (three) times daily before meals. 90 tablet 11  . TRAVATAN Z 0.004 % SOLN ophthalmic solution Place 1 drop into both eyes at bedtime.  4   No current facility-administered medications for this visit.     Allergies:   Levaquin [levofloxacin in d5w] and Tramadol    Social History:  The patient  reports that he has never smoked. He has never used smokeless tobacco. He reports that he does not drink alcohol or use drugs.   Family History:  The patient's family history includes Arthritis in his father and mother; Cancer in his other; Heart disease in his father and mother; Hypertension in his father and mother.    ROS:  Please see the history of present illness.   Otherwise, review of systems are positive for none.   All other systems are reviewed and negative.    PHYSICAL EXAM: VS:  BP (!) 198/88   Pulse 68   Ht 5\' 5"  (1.651 m)   Wt 146 lb (66.2 kg)   BMI 24.30 kg/m  , BMI Body mass index is 24.3 kg/m. GEN: Well nourished, well developed, in no acute distress  HEENT: normal  Neck: no JVD, carotid bruits, or masses Cardiac: RRR; no murmurs, rubs, or gallops,no edema  Respiratory:  clear to auscultation bilaterally, normal work of breathing GI: soft, nontender, nondistended, + BS MS: no deformity or atrophy  Skin: warm and dry, no rash Neuro:  Strength and sensation are intact Psych: euthymic mood, full affect Distal pulses are not palpable.   EKG:  EKG is not ordered today.   Recent Labs: 03/25/2016: ALT 11; BUN 22; Creatinine, Ser 0.99; Hemoglobin 12.8; Platelets 187.0; Potassium 4.3; Sodium 140; TSH 2.21    Lipid Panel    Component Value Date/Time   CHOL 141 03/25/2016 1022   TRIG 99.0 03/25/2016 1022   HDL 43.00 03/25/2016 1022   CHOLHDL 3 03/25/2016 1022   VLDL 19.8 03/25/2016 1022   LDLCALC 78 03/25/2016 1022     LDLDIRECT 114.4 05/13/2012 1417      Wt Readings from Last 3 Encounters:  07/15/16 146 lb (66.2 kg)  06/19/16 150 lb (68 kg)  05/20/16 151 lb (68.5 kg)       ASSESSMENT AND PLAN:  1. Chronic venous insufficiency: Improved with leg elevation. Given advanced peripheral arterial disease, will avoid support stockings.  2. Peripheral arterial disease with claudication:  He has minimal claudication at this time. Continue medical therapy and cilostazol..  3. Hyperlipidemia: Continue treatment with atorvastatin. Most recent LDL was 73 December 2016 .   4. Coronary artery disease involving native coronary arteries without angina: Continue medical therapy.   5. Essential hypertension: Blood pressure continues to be elevated but he has not taken his blood pressure medications today.  Consider adding another blood pressure medication if needed in the future.    Disposition:   FU with me in 6 months  Signed,  [name], MD  07/15/2016 10:26 AM    [place]

## 2016-07-23 ENCOUNTER — Telehealth: Payer: Self-pay | Admitting: Internal Medicine

## 2016-07-23 NOTE — Telephone Encounter (Signed)
Attempted to call pt to schedule awv. Patient did not answer. Will try to call patient again at a later time.

## 2016-08-06 NOTE — Progress Notes (Addendum)
Subjective:   Jeffrey Kaiser is a 83 y.o. male who presents for an Initial Medicare Annual Wellness Visit.  Review of Systems  No ROS.  Medicare Wellness Visit.  Cardiac Risk Factors include: diabetes mellitus;dyslipidemia;hypertension;male gender Sleep patterns: no sleep issues, feels rested on waking, does not get up to void, gets up 1-2 times nightly to void and sleeps 7-8 hours nightly.   Home Safety/Smoke Alarms:   Living environment; residence and Firearm Safety: 1-story house/ trailer, no firearms. Lives with wife and daughter, no DME needed Seat Belt Safety/Bike Helmet: Wears seat belt.   Counseling:   Eye Exam- appointment yearly Dental- appointment every 6 months  Male:   CCS-  N/A   PSA- No results found for: PSA    Objective:    Today's Vitals   08/07/16 1023  BP: (!) 156/92  Pulse: 74  Weight: 146 lb (66.2 kg)  Height: 5\' 5"  (1.651 m)  PainSc: 3    Body mass index is 24.3 kg/m.  Current Medications (verified) Outpatient Encounter Prescriptions as of 08/07/2016  Medication Sig  . acetaminophen (TYLENOL) 325 MG suppository Place 325 mg rectally every 4 (four) hours as needed.  Marland Kitchen acetaminophen-codeine (TYLENOL #3) 300-30 MG per tablet Take 1 tablet by mouth every 6 (six) hours as needed for moderate pain.  Marland Kitchen aspirin 81 MG tablet Take 81 mg by mouth daily.  Marland Kitchen atorvastatin (LIPITOR) 20 MG tablet TAKE 1 TABLET (20 MG TOTAL) BY MOUTH DAILY.  . bromocriptine (PARLODEL) 2.5 MG tablet TAKE 1/2 TABLET AT BEDTIME  . ciclopirox (LOPROX) 0.77 % cream Apply 1 application topically daily as needed.  . cilostazol (PLETAL) 100 MG tablet Take 1 tablet (100 mg total) by mouth 2 (two) times daily.  . clobetasol (TEMOVATE) 0.05 % external solution Apply topically 2 (two) times daily.  . fosinopril (MONOPRIL) 20 MG tablet TAKE 1 TABLET (20 MG TOTAL) BY MOUTH DAILY.  Marland Kitchen glucose blood (ONETOUCH VERIO) test strip 1 each by Other route daily. And lancets 1/day 250.00  . isosorbide  mononitrate (IMDUR) 60 MG 24 hr tablet Take 0.5 tablets (30 mg total) by mouth daily.  . metFORMIN (GLUCOPHAGE) 500 MG tablet Take 1 tablet (500 mg total) by mouth as directed. Take 2 tablets in the morning, 1 tablet at noon and 2 tablets at evening  . metoprolol succinate (TOPROL-XL) 50 MG 24 hr tablet TAKE 1 TABLET (50 MG TOTAL) BY MOUTH DAILY. TAKE WITH OR IMMEDIATELY FOLLOWING A MEAL.  . repaglinide (PRANDIN) 0.5 MG tablet Take 1 tablet (0.5 mg total) by mouth 3 (three) times daily before meals.  . TRAVATAN Z 0.004 % SOLN ophthalmic solution Place 1 drop into both eyes at bedtime.   No facility-administered encounter medications on file as of 08/07/2016.     Allergies (verified) Levaquin [levofloxacin in d5w] and Tramadol   History: Past Medical History:  Diagnosis Date  . Anemia 09/04/2010  . Arthritis   . BPH (benign prostatic hyperplasia) 09/09/2010  . CAD (coronary artery disease)   . DDD (degenerative disc disease), cervical 09/04/2010  . Diabetes mellitus   . Diastolic dysfunction 1/32/4401  . Diverticulitis   . Glaucoma   . Hernia   . History of colonic diverticulitis 09/04/2010  . Hyperlipidemia   . Hypertension   . Psoriasis 09/04/2010   Past Surgical History:  Procedure Laterality Date  . broken legs  1981 and 1995  . PERIPHERAL VASCULAR CATHETERIZATION N/A 10/25/2014   Procedure: Abdominal Aortogram;  Surgeon: Wellington Hampshire,  MD;  Location: [facility];  Service: Cardiovascular;  Laterality: N/A;  . PERIPHERAL VASCULAR CATHETERIZATION Bilateral 10/25/2014   Procedure: Lower Extremity Angiography;  Surgeon: Wellington Hampshire, MD;  Location: [facility];  Service: Cardiovascular;  Laterality: Bilateral;  . s/p lipoma  2003  . TONSILLECTOMY  1952   Family History  Problem Relation Age of Onset  . Arthritis Mother   . Heart disease Mother   . Hypertension Mother   . Arthritis Father   . Heart disease Father   . Hypertension Father   . Cancer Other      lung cancer   Social History   Occupational History  . retired    Social History Main Topics  . Smoking status: Never Smoker  . Smokeless tobacco: Never Used  . Alcohol use No  . Drug use: No  . Sexual activity: No   Tobacco Counseling Counseling given: Not Answered   Activities of Daily Living In your present state of health, do you have any difficulty performing the following activities: 08/07/2016  Hearing? N  Vision? N  Difficulty concentrating or making decisions? N  Walking or climbing stairs? N  Dressing or bathing? N  Doing errands, shopping? N  Preparing Food and eating ? N  Using the Toilet? N  In the past six months, have you accidently leaked urine? N  Do you have problems with loss of bowel control? N  Managing your Medications? N  Managing your Finances? N  Housekeeping or managing your Housekeeping? N  Some recent data might be hidden    Immunizations and Health Maintenance Immunization History  Administered Date(s) Administered  . Influenza Split 03/07/2011  . Influenza, Seasonal, Injecte, Preservative Fre 05/13/2012  . Influenza,inj,Quad PF,36+ Mos 12/03/2012, 01/18/2014, 04/06/2015, 02/19/2016  . Pneumococcal Conjugate-13 04/05/2014  . Pneumococcal Polysaccharide-23 09/04/2011  . Td 10/05/2014   Health Maintenance Due  Topic Date Due  . OPHTHALMOLOGY EXAM  06/15/2014    Patient Care Team: [name], MD as PCP - General (Internal Medicine)  Indicate any recent Medical Services you may have received from other than [facility] providers in the past year (date may be approximate).    Assessment:   This is a routine wellness examination for Jeffrey Kaiser. Physical assessment deferred to PCP.    Hearing/Vision screen Hearing Screening Comments: Able to hear conversational tones w/o difficulty. No issues reported.  Passed whisper test Vision Screening Comments: Wears glasses  Dietary issues and exercise activities discussed: Current Exercise Habits:  The patient has a physically strenous job, but has no regular exercise apart from work., Exercise limited by: orthopedic condition(s)  Diet (meal preparation, eat out, water intake, caffeinated beverages, dairy products, fruits and vegetables): in general, a "healthy" diet  , well balanced, diabetic, high fat/ cholesterol, low salt, eats a variety of fruits and vegetables daily, limits salt, fat/cholesterol, sugar, caffeine, drinks 2-3 glasses of water daily.  Encouraged patient to increase daily water intake.  Goals    . Maintain current health status          Continue to eat healthy and be active.      Depression Screen PHQ 2/9 Scores 10/03/2015 10/31/2014 10/05/2014 12/03/2012  PHQ - 2 Score 0 0 1 0    Fall Risk Fall Risk  08/07/2016 10/03/2015 10/31/2014 10/05/2014 12/03/2012  Falls in the past year? No No No No No    Cognitive Function: MMSE - Mini Mental State Exam 08/07/2016  Orientation to time 5  Orientation  to Place 5  Registration 3  Attention/ Calculation 5  Recall 3  Language- name 2 objects 2  Language- repeat 1  Language- follow 3 step command 3  Language- read & follow direction 1  Write a sentence 1  Copy design 1  Total score 30        Screening Tests Health Maintenance  Topic Date Due  . OPHTHALMOLOGY EXAM  06/15/2014  . INFLUENZA VACCINE  11/05/2016  . HEMOGLOBIN A1C  12/20/2016  . FOOT EXAM  06/19/2017  . TETANUS/TDAP  10/04/2024  . PNA vac Low Risk Adult  Completed        Plan:    Continue to eat heart healthy diet (full of fruits, vegetables, whole grains, lean protein, water--limit salt, fat, and sugar intake) and increase physical activity as tolerated.  Continue doing brain stimulating activities (puzzles, reading, adult coloring books, staying active) to keep memory sharp.   I have personally reviewed and noted the following in the patient's chart:   . Medical and social history . Use of alcohol, tobacco or illicit drugs  . Current  medications and supplements . Functional ability and status . Nutritional status . Physical activity . Advanced directives . List of other physicians . Hospitalizations, surgeries, and ER visits in previous 12 months . Vitals . Screenings to include cognitive, depression, and falls . Referrals and appointments  In addition, I have reviewed and discussed with patient certain preventive protocols, quality metrics, and best practice recommendations. A written personalized care plan for preventive services as well as general preventive health recommendations were provided to patient.     Michiel Cowboy, RN   08/07/2016   Medical screening examination/treatment/procedure(s) were performed by non-physician practitioner and as supervising physician I was immediately available for consultation/collaboration. I agree with above. Cathlean Cower, MD

## 2016-08-06 NOTE — Progress Notes (Signed)
Pre visit review using our clinic review tool, if applicable. No additional management support is needed unless otherwise documented below in the visit note. 

## 2016-08-07 ENCOUNTER — Ambulatory Visit (INDEPENDENT_AMBULATORY_CARE_PROVIDER_SITE_OTHER): Payer: Medicare Other | Admitting: *Deleted

## 2016-08-07 VITALS — BP 156/92 | HR 74 | Ht 65.0 in | Wt 146.0 lb

## 2016-08-07 DIAGNOSIS — Z0000 Encounter for general adult medical examination without abnormal findings: Secondary | ICD-10-CM

## 2016-08-07 NOTE — Patient Instructions (Addendum)
Continue to eat heart healthy diet (full of fruits, vegetables, whole grains, lean protein, water--limit salt, fat, and sugar intake) and increase physical activity as tolerated.  Continue doing brain stimulating activities (puzzles, reading, adult coloring books, staying active) to keep memory sharp.   Jeffrey Kaiser , Thank you for taking time to come for your Medicare Wellness Visit. I appreciate your ongoing commitment to your health goals. Please review the following plan we discussed and let me know if I can assist you in the future.   These are the goals we discussed: Goals    . Maintain current health status          Continue to eat healthy and be active.       This is a list of the screening recommended for you and due dates:  Health Maintenance  Topic Date Due  . Eye exam for diabetics  06/15/2014  . Flu Shot  11/05/2016  . Hemoglobin A1C  12/20/2016  . Complete foot exam   06/19/2017  . Tetanus Vaccine  10/04/2024  . Pneumonia vaccines  Completed   Insomnia Insomnia is a sleep disorder that makes it difficult to fall asleep or to stay asleep. Insomnia can cause tiredness (fatigue), low energy, difficulty concentrating, mood swings, and poor performance at work or school. There are three different ways to classify insomnia:  Difficulty falling asleep.  Difficulty staying asleep.  Waking up too early in the morning. Any type of insomnia can be long-term (chronic) or short-term (acute). Both are common. Short-term insomnia usually lasts for three months or less. Chronic insomnia occurs at least three times a week for longer than three months. What are the causes? Insomnia may be caused by another condition, situation, or substance, such as:  Anxiety.  Certain medicines.  Gastroesophageal reflux disease (GERD) or other gastrointestinal conditions.  Asthma or other breathing conditions.  Restless legs syndrome, sleep apnea, or other sleep disorders.  Chronic  pain.  Menopause. This may include hot flashes.  Stroke.  Abuse of alcohol, tobacco, or illegal drugs.  Depression.  Caffeine.  Neurological disorders, such as Alzheimer disease.  An overactive thyroid (hyperthyroidism). The cause of insomnia may not be known. What increases the risk? Risk factors for insomnia include:  Gender. Women are more commonly affected than men.  Age. Insomnia is more common as you get older.  Stress. This may involve your professional or personal life.  Income. Insomnia is more common in people with lower income.  Lack of exercise.  Irregular work schedule or night shifts.  Traveling between different time zones. What are the signs or symptoms? If you have insomnia, trouble falling asleep or trouble staying asleep is the main symptom. This may lead to other symptoms, such as:  Feeling fatigued.  Feeling nervous about going to sleep.  Not feeling rested in the morning.  Having trouble concentrating.  Feeling irritable, anxious, or depressed. How is this treated? Treatment for insomnia depends on the cause. If your insomnia is caused by an underlying condition, treatment will focus on addressing the condition. Treatment may also include:  Medicines to help you sleep.  Counseling or therapy.  Lifestyle adjustments. Follow these instructions at home:  Take medicines only as directed by your health care provider.  Keep regular sleeping and waking hours. Avoid naps.  Keep a sleep diary to help you and your health care provider figure out what could be causing your insomnia. Include:  When you sleep.  When you wake up during the  night.  How well you sleep.  How rested you feel the next day.  Any side effects of medicines you are taking.  What you eat and drink.  Make your bedroom a comfortable place where it is easy to fall asleep:  Put up shades or special blackout curtains to block light from outside.  Use a white noise  machine to block noise.  Keep the temperature cool.  Exercise regularly as directed by your health care provider. Avoid exercising right before bedtime.  Use relaxation techniques to manage stress. Ask your health care provider to suggest some techniques that may work well for you. These may include:  Breathing exercises.  Routines to release muscle tension.  Visualizing peaceful scenes.  Cut back on alcohol, caffeinated beverages, and cigarettes, especially close to bedtime. These can disrupt your sleep.  Do not overeat or eat spicy foods right before bedtime. This can lead to digestive discomfort that can make it hard for you to sleep.  Limit screen use before bedtime. This includes:  Watching TV.  Using your smartphone, tablet, and computer.  Stick to a routine. This can help you fall asleep faster. Try to do a quiet activity, brush your teeth, and go to bed at the same time each night.  Get out of bed if you are still awake after 15 minutes of trying to sleep. Keep the lights down, but try reading or doing a quiet activity. When you feel sleepy, go back to bed.  Make sure that you drive carefully. Avoid driving if you feel very sleepy.  Keep all follow-up appointments as directed by your health care provider. This is important. Contact a health care provider if:  You are tired throughout the day or have trouble in your daily routine due to sleepiness.  You continue to have sleep problems or your sleep problems get worse. Get help right away if:  You have serious thoughts about hurting yourself or someone else. This information is not intended to replace advice given to you by your health care provider. Make sure you discuss any questions you have with your health care provider. Document Released: 03/21/2000 Document Revised: 08/24/2015 Document Reviewed: 12/23/2013 Elsevier Interactive Patient Education  2017 [employer].

## 2016-08-21 ENCOUNTER — Other Ambulatory Visit: Payer: Self-pay | Admitting: Internal Medicine

## 2016-09-15 ENCOUNTER — Other Ambulatory Visit: Payer: Self-pay | Admitting: Endocrinology

## 2016-09-23 ENCOUNTER — Ambulatory Visit (INDEPENDENT_AMBULATORY_CARE_PROVIDER_SITE_OTHER): Payer: Medicare Other | Admitting: Internal Medicine

## 2016-09-23 ENCOUNTER — Encounter: Payer: Self-pay | Admitting: Internal Medicine

## 2016-09-23 VITALS — BP 126/70 | HR 79 | Ht 65.0 in | Wt 141.0 lb

## 2016-09-23 DIAGNOSIS — E784 Other hyperlipidemia: Secondary | ICD-10-CM

## 2016-09-23 DIAGNOSIS — I519 Heart disease, unspecified: Secondary | ICD-10-CM | POA: Diagnosis not present

## 2016-09-23 DIAGNOSIS — I2510 Atherosclerotic heart disease of native coronary artery without angina pectoris: Secondary | ICD-10-CM | POA: Diagnosis not present

## 2016-09-23 DIAGNOSIS — I10 Essential (primary) hypertension: Secondary | ICD-10-CM | POA: Diagnosis not present

## 2016-09-23 DIAGNOSIS — I5189 Other ill-defined heart diseases: Secondary | ICD-10-CM

## 2016-09-23 DIAGNOSIS — E7849 Other hyperlipidemia: Secondary | ICD-10-CM

## 2016-09-23 NOTE — Progress Notes (Signed)
Subjective:    Patient ID: Jeffrey Kaiser, male    DOB: 05-04-1925, 83 y.o.   MRN: [id number]  HPI  Here to f/u; overall doing ok,  Pt denies chest pain, increasing sob or doe, wheezing, orthopnea, PND, increased LE swelling, palpitations, dizziness or syncope.  Pt denies new neurological symptoms such as new headache, or facial or extremity weakness or numbness.  Pt denies polydipsia, polyuria, or low sugar episode.  Pt states overall good compliance with meds, mostly trying to follow appropriate diet, with wt overall stable,  but little exercise however. Last eye appt x 3 mo, having more dental work done with implants.  Seeing Endo for DM.  Wt tends to range 14 1to 146. No new complaints  Has a cane at home but does not usually use.  No recent falls Wt Readings from Last 3 Encounters:  09/23/16 141 lb (64 kg)  08/07/16 146 lb (66.2 kg)  07/15/16 146 lb (66.2 kg)   BP Readings from Last 3 Encounters:  09/23/16 126/70  08/07/16 (!) 156/92  07/15/16 (!) 198/88   Past Medical History:  Diagnosis Date  . Anemia 09/04/2010  . Arthritis   . BPH (benign prostatic hyperplasia) 09/09/2010  . CAD (coronary artery disease)   . DDD (degenerative disc disease), cervical 09/04/2010  . Diabetes mellitus   . Diastolic dysfunction 0/26/3785  . Diverticulitis   . Glaucoma   . Hernia   . History of colonic diverticulitis 09/04/2010  . Hyperlipidemia   . Hypertension   . Psoriasis 09/04/2010   Past Surgical History:  Procedure Laterality Date  . broken legs  1981 and 1995  . PERIPHERAL VASCULAR CATHETERIZATION N/A 10/25/2014   Procedure: Abdominal Aortogram;  Surgeon: Wellington Hampshire, MD;  Location: [facility];  Service: Cardiovascular;  Laterality: N/A;  . PERIPHERAL VASCULAR CATHETERIZATION Bilateral 10/25/2014   Procedure: Lower Extremity Angiography;  Surgeon: Wellington Hampshire, MD;  Location: [facility];  Service: Cardiovascular;  Laterality: Bilateral;  . s/p lipoma  2003  .  TONSILLECTOMY  1952    reports that he has never smoked. He has never used smokeless tobacco. He reports that he does not drink alcohol or use drugs. family history includes Arthritis in his father and mother; Cancer in his other; Heart disease in his father and mother; Hypertension in his father and mother. Allergies  Allergen Reactions  . Levaquin [Levofloxacin In D5w] Nausea Only  . Tramadol Other (See Comments)    somnolence   Current Outpatient Prescriptions on File Prior to Visit  Medication Sig Dispense Refill  . acetaminophen (TYLENOL) 325 MG suppository Place 325 mg rectally every 4 (four) hours as needed.    Marland Kitchen acetaminophen-codeine (TYLENOL #3) 300-30 MG per tablet Take 1 tablet by mouth every 6 (six) hours as needed for moderate pain. 60 tablet 0  . aspirin 81 MG tablet Take 81 mg by mouth daily.    Marland Kitchen atorvastatin (LIPITOR) 20 MG tablet TAKE 1 TABLET (20 MG TOTAL) BY MOUTH DAILY. 90 tablet 1  . bromocriptine (PARLODEL) 2.5 MG tablet TAKE 1/2 TABLET AT BEDTIME 45 tablet 0  . ciclopirox (LOPROX) 0.77 % cream Apply 1 application topically daily as needed.    . cilostazol (PLETAL) 100 MG tablet Take 1 tablet (100 mg total) by mouth 2 (two) times daily. 180 tablet 3  . clobetasol (TEMOVATE) 0.05 % external solution Apply topically 2 (two) times daily. 50 mL 1  . fosinopril (MONOPRIL) 20 MG tablet TAKE  1 TABLET (20 MG TOTAL) BY MOUTH DAILY. 90 tablet 2  . glucose blood (ONETOUCH VERIO) test strip 1 each by Other route daily. And lancets 1/day 250.00 100 each 12  . isosorbide mononitrate (IMDUR) 60 MG 24 hr tablet Take 0.5 tablets (30 mg total) by mouth daily. 45 tablet 3  . metFORMIN (GLUCOPHAGE) 500 MG tablet TAKE 2 TABLETS IN THE MORNING 1 TABLET AT NOON AND 2 TABLETS IN THE EVENING 150 tablet 0  . metoprolol succinate (TOPROL-XL) 50 MG 24 hr tablet TAKE 1 TABLET (50 MG TOTAL) BY MOUTH DAILY. TAKE WITH OR IMMEDIATELY FOLLOWING A MEAL. 90 tablet 3  . repaglinide (PRANDIN) 0.5 MG tablet  Take 1 tablet (0.5 mg total) by mouth 3 (three) times daily before meals. 90 tablet 11  . TRAVATAN Z 0.004 % SOLN ophthalmic solution Place 1 drop into both eyes at bedtime.  4   No current facility-administered medications on file prior to visit.    Review of Systems  Constitutional: Negative for other unusual diaphoresis or sweats HENT: Negative for ear discharge or swelling Eyes: Negative for other worsening visual disturbances Respiratory: Negative for stridor or other swelling  Gastrointestinal: Negative for worsening distension or other blood Genitourinary: Negative for retention or other urinary change Musculoskeletal: Negative for other MSK pain or swelling Skin: Negative for color change or other new lesions Neurological: Negative for worsening tremors and other numbness  Psychiatric/Behavioral: Negative for worsening agitation or other fatigue All other system neg per pt    Objective:   Physical Exam BP 126/70   Pulse 79   Ht 5\' 5"  (1.651 m)   Wt 141 lb (64 kg)   SpO2 98%   BMI 23.46 kg/m  VS noted,  Constitutional: Pt appears in NAD HENT: Head: NCAT.  Right Ear: External ear normal.  Left Ear: External ear normal.  Eyes: . Pupils are equal, round, and reactive to light. Conjunctivae and EOM are normal Nose: without d/c or deformity Neck: Neck supple. Gross normal ROM Cardiovascular: Normal rate and regular rhythm.   Pulmonary/Chest: Effort normal and breath sounds without rales or wheezing.  MSK: + spinal kyphosis Neurological: Pt is alert. At baseline orientation, motor grossly intact Skin: Skin is warm. No rashes, other new lesions, no LE edema Psychiatric: Pt behavior is normal without agitation  No other exam findings  Lab Results  Component Value Date   WBC 8.0 03/25/2016   HGB 12.8 (L) 03/25/2016   HCT 38.8 (L) 03/25/2016   PLT 187.0 03/25/2016   GLUCOSE 161 (H) 03/25/2016   CHOL 141 03/25/2016   TRIG 99.0 03/25/2016   HDL 43.00 03/25/2016    LDLDIRECT 114.4 05/13/2012   LDLCALC 78 03/25/2016   ALT 11 03/25/2016   AST 16 03/25/2016   NA 140 03/25/2016   K 4.3 03/25/2016   CL 103 03/25/2016   CREATININE 0.99 03/25/2016   BUN 22 03/25/2016   CO2 28 03/25/2016   TSH 2.21 03/25/2016   INR 1.1 (H) 10/17/2014   HGBA1C 6.9 06/19/2016   MICROALBUR 1.5 10/05/2014       Assessment & Plan:

## 2016-09-23 NOTE — Assessment & Plan Note (Signed)
stable overall by history and exam, recent data reviewed with pt, and pt to continue medical treatment as before,  to f/u any worsening symptoms or concerns Lab Results  Component Value Date   LDLCALC 78 03/25/2016    

## 2016-09-23 NOTE — Patient Instructions (Signed)
Please continue all other medications as before, and refills have been done if requested.  Please have the pharmacy call with any other refills you may need.  Please continue your efforts at being more active, low cholesterol diet, and weight control.  You are otherwise up to date with prevention measures today.  Please keep your appointments with your specialists as you may have planned  Please return in 6 months, or sooner if needed 

## 2016-09-23 NOTE — Assessment & Plan Note (Signed)
Stable volume, cont same tx

## 2016-09-23 NOTE — Assessment & Plan Note (Signed)
stable overall by history and exam, recent data reviewed with pt, and pt to continue medical treatment as before,  to f/u any worsening symptoms or concerns BP Readings from Last 3 Encounters:  09/23/16 126/70  08/07/16 (!) 156/92  07/15/16 (!) 198/88

## 2016-09-30 DIAGNOSIS — H524 Presbyopia: Secondary | ICD-10-CM | POA: Diagnosis not present

## 2016-09-30 DIAGNOSIS — H52223 Regular astigmatism, bilateral: Secondary | ICD-10-CM | POA: Diagnosis not present

## 2016-09-30 DIAGNOSIS — H401132 Primary open-angle glaucoma, bilateral, moderate stage: Secondary | ICD-10-CM | POA: Diagnosis not present

## 2016-09-30 DIAGNOSIS — H35363 Drusen (degenerative) of macula, bilateral: Secondary | ICD-10-CM | POA: Diagnosis not present

## 2016-09-30 DIAGNOSIS — E119 Type 2 diabetes mellitus without complications: Secondary | ICD-10-CM | POA: Diagnosis not present

## 2016-09-30 DIAGNOSIS — H53432 Sector or arcuate defects, left eye: Secondary | ICD-10-CM | POA: Diagnosis not present

## 2016-09-30 LAB — HM DIABETES EYE EXAM

## 2016-10-14 ENCOUNTER — Other Ambulatory Visit: Payer: Self-pay | Admitting: Cardiovascular Disease

## 2016-10-14 DIAGNOSIS — I739 Peripheral vascular disease, unspecified: Secondary | ICD-10-CM

## 2016-10-20 ENCOUNTER — Ambulatory Visit (INDEPENDENT_AMBULATORY_CARE_PROVIDER_SITE_OTHER): Payer: Medicare Other | Admitting: Endocrinology

## 2016-10-20 ENCOUNTER — Encounter: Payer: Self-pay | Admitting: Endocrinology

## 2016-10-20 VITALS — BP 146/72 | HR 87 | Resp 97 | Wt 136.8 lb

## 2016-10-20 DIAGNOSIS — E1151 Type 2 diabetes mellitus with diabetic peripheral angiopathy without gangrene: Secondary | ICD-10-CM

## 2016-10-20 DIAGNOSIS — I2510 Atherosclerotic heart disease of native coronary artery without angina pectoris: Secondary | ICD-10-CM

## 2016-10-20 LAB — POCT GLYCOSYLATED HEMOGLOBIN (HGB A1C): HEMOGLOBIN A1C: 7.5

## 2016-10-20 MED ORDER — GLUCOSE BLOOD VI STRP: 1.0000 | ORAL_STRIP | Freq: Every day | 12 refills | Status: DC

## 2016-10-20 NOTE — Patient Instructions (Signed)
check your blood sugar once a day.  vary the time of day when you check, between before the 3 meals, and at bedtime.  also check if you have symptoms of your blood sugar being too high or too low.  please keep a record of the readings and bring it to your next appointment here.  You can write it on any piece of paper.  please call us sooner if your blood sugar goes below 70, or if you have a lot of readings over 200.   Please continue the same diabetes medications.  Please come back for a follow-up appointment in 4 months.     

## 2016-10-20 NOTE — Progress Notes (Signed)
Subjective:    Patient ID: Jeffrey Kaiser, male    DOB: 1925/08/16, 83 y.o.   MRN: [id number]  HPI Pt returns for f/u of diabetes mellitus:  DM type: 2 Dx'ed: 2703 Complications: retinopathy, renal insufficiency, CAD, and PAD. Therapy: 3 oral meds.  DKA: never.   Severe hypoglycemia: never.  Pancreatitis: never.   Other: he has never been on insulin; invokana rx is limited by hyperkalemia.   Interval history: no cbg record, but states cbg's are well-controlled.  pt states he feels well in general.   Past Medical History:  Diagnosis Date  . Anemia 09/04/2010  . Arthritis   . BPH (benign prostatic hyperplasia) 09/09/2010  . CAD (coronary artery disease)   . DDD (degenerative disc disease), cervical 09/04/2010  . Diabetes mellitus   . Diastolic dysfunction 5/00/9381  . Diverticulitis   . Glaucoma   . Hernia   . History of colonic diverticulitis 09/04/2010  . Hyperlipidemia   . Hypertension   . Psoriasis 09/04/2010    Past Surgical History:  Procedure Laterality Date  . broken legs  1981 and 1995  . PERIPHERAL VASCULAR CATHETERIZATION N/A 10/25/2014   Procedure: Abdominal Aortogram;  Surgeon: Wellington Hampshire, MD;  Location: [facility];  Service: Cardiovascular;  Laterality: N/A;  . PERIPHERAL VASCULAR CATHETERIZATION Bilateral 10/25/2014   Procedure: Lower Extremity Angiography;  Surgeon: Wellington Hampshire, MD;  Location: [facility];  Service: Cardiovascular;  Laterality: Bilateral;  . s/p lipoma  2003  . TONSILLECTOMY  1952    Social History   Social History  . Marital status: Single    Spouse name: N/A  . Number of children: N/A  . Years of education: N/A   Occupational History  . retired    Social History Main Topics  . Smoking status: Never Smoker  . Smokeless tobacco: Never Used  . Alcohol use No  . Drug use: No  . Sexual activity: No   Other Topics Concern  . Not on file   Social History Narrative  . No narrative on file    Current  Outpatient Prescriptions on File Prior to Visit  Medication Sig Dispense Refill  . acetaminophen (TYLENOL) 325 MG suppository Place 325 mg rectally every 4 (four) hours as needed.    Marland Kitchen acetaminophen-codeine (TYLENOL #3) 300-30 MG per tablet Take 1 tablet by mouth every 6 (six) hours as needed for moderate pain. 60 tablet 0  . aspirin 81 MG tablet Take 81 mg by mouth daily.    Marland Kitchen atorvastatin (LIPITOR) 20 MG tablet TAKE 1 TABLET (20 MG TOTAL) BY MOUTH DAILY. 90 tablet 1  . bromocriptine (PARLODEL) 2.5 MG tablet TAKE 1/2 TABLET AT BEDTIME 45 tablet 0  . ciclopirox (LOPROX) 0.77 % cream Apply 1 application topically daily as needed.    . cilostazol (PLETAL) 100 MG tablet TAKE 1 TABLET BY MOUTH TWICE A DAY 180 tablet 1  . clobetasol (TEMOVATE) 0.05 % external solution Apply topically 2 (two) times daily. 50 mL 1  . fosinopril (MONOPRIL) 20 MG tablet TAKE 1 TABLET (20 MG TOTAL) BY MOUTH DAILY. 90 tablet 2  . isosorbide mononitrate (IMDUR) 60 MG 24 hr tablet Take 0.5 tablets (30 mg total) by mouth daily. 45 tablet 3  . metFORMIN (GLUCOPHAGE) 500 MG tablet TAKE 2 TABLETS IN THE MORNING 1 TABLET AT NOON AND 2 TABLETS IN THE EVENING 150 tablet 0  . metoprolol succinate (TOPROL-XL) 50 MG 24 hr tablet TAKE 1 TABLET (50 MG  TOTAL) BY MOUTH DAILY. TAKE WITH OR IMMEDIATELY FOLLOWING A MEAL. 90 tablet 3  . repaglinide (PRANDIN) 0.5 MG tablet Take 1 tablet (0.5 mg total) by mouth 3 (three) times daily before meals. 90 tablet 11  . TRAVATAN Z 0.004 % SOLN ophthalmic solution Place 1 drop into both eyes at bedtime.  4   No current facility-administered medications on file prior to visit.     Allergies  Allergen Reactions  . Levaquin [Levofloxacin In D5w] Nausea Only  . Tramadol Other (See Comments)    somnolence    Family History  Problem Relation Age of Onset  . Arthritis Mother   . Heart disease Mother   . Hypertension Mother   . Arthritis Father   . Heart disease Father   . Hypertension Father   .  Cancer Other        lung cancer    BP (!) 146/72   Pulse 87   Resp (!) 97   Wt 136 lb 12.8 oz (62.1 kg)   BMI 22.76 kg/m    Review of Systems He denies hypoglycemia    Objective:   Physical Exam VITAL SIGNS:  See vs page GENERAL: no distress Pulses: foot pulses are intact bilaterally.   MSK: no deformity of the feet or ankles.  CV: trace bilat edema of the legs, and bilat vv's Skin:  no ulcer on the feet or ankles.  normal color and temp on the feet and ankles Neuro: sensation is intact to touch on the feet and ankles.   Ext: There is bilateral onychomycosis of the toenails.   A1c=7.5%  Lab Results  Component Value Date   CREATININE 0.99 03/25/2016   BUN 22 03/25/2016   NA 140 03/25/2016   K 4.3 03/25/2016   CL 103 03/25/2016   CO2 28 03/25/2016      Assessment & Plan:  Type 2 DM: well-controlled.  Frail elderly state: he is not a candidate for aggressive glycemic control.    Patient Instructions  check your blood sugar once a day.  vary the time of day when you check, between before the 3 meals, and at bedtime.  also check if you have symptoms of your blood sugar being too high or too low.  please keep a record of the readings and bring it to your next appointment here.  You can write it on any piece of paper.  please call us sooner if your blood sugar goes below 70, or if you have a lot of readings over 200.   Please continue the same diabetes medications.   Please come back for a follow-up appointment in 4 months.

## 2016-12-18 ENCOUNTER — Other Ambulatory Visit: Payer: Self-pay | Admitting: Internal Medicine

## 2016-12-27 ENCOUNTER — Other Ambulatory Visit: Payer: Self-pay | Admitting: Endocrinology

## 2017-01-13 ENCOUNTER — Encounter: Payer: Self-pay | Admitting: Cardiovascular Disease

## 2017-01-13 ENCOUNTER — Ambulatory Visit (INDEPENDENT_AMBULATORY_CARE_PROVIDER_SITE_OTHER): Payer: Medicare Other | Admitting: Cardiovascular Disease

## 2017-01-13 VITALS — BP 136/72 | HR 90 | Ht 65.0 in | Wt 145.0 lb

## 2017-01-13 DIAGNOSIS — I739 Peripheral vascular disease, unspecified: Secondary | ICD-10-CM | POA: Diagnosis not present

## 2017-01-13 DIAGNOSIS — I2510 Atherosclerotic heart disease of native coronary artery without angina pectoris: Secondary | ICD-10-CM | POA: Diagnosis not present

## 2017-01-13 DIAGNOSIS — I10 Essential (primary) hypertension: Secondary | ICD-10-CM

## 2017-01-13 DIAGNOSIS — E7849 Other hyperlipidemia: Secondary | ICD-10-CM

## 2017-01-13 NOTE — Progress Notes (Signed)
Cardiology Office Note   Date:  01/13/2017   ID:  Jeffrey, Kaiser Jul 26, 1925, MRN [id number]  PCP:  [name], MD  Cardiologist:   [name], MD   Chief Complaint  Patient presents with  . Follow-up    6 months      History of Present Illness: Jeffrey Kaiser is a 83 y.o. male who presents for a follow up visit regarding peripheral arterial disease and claudication. He has reported history of coronary artery disease with no available details, prolonged history of diabetes, hyperlipidemia, hypertension and arthritis. He has no history of tobacco use.  He was seen in 2016 for severe left calf claudication. Noninvasive vascular evaluation showed an ABI of 0.93 on the right and 0.31 on the left.  Angiography in 7/16  showed: 1. No significant aortoiliac disease. 2. Moderate distal left common femoral artery stenosis. Mild nonobstructive disease in the left SFA. Subtotal occlusion of the left popliteal artery proximally with 1 vessel runoff below the knee. 3. Mild nonobstructive disease affecting the right SFA and popliteal arteries. 2 vessel runoff below the knee on the right. The patient was treated medically with the addition of cilostazol with significant improvement in symptoms.   He had left leg swelling with no evidence of DVT. It was possibly due to chronic venous insufficiency. It improved with leg elevation. He has been doing well overall with no chest pain or shortness of breath. He denies leg claudication.  Past Medical History:  Diagnosis Date  . Anemia 09/04/2010  . Arthritis   . BPH (benign prostatic hyperplasia) 09/09/2010  . CAD (coronary artery disease)   . DDD (degenerative disc disease), cervical 09/04/2010  . Diabetes mellitus   . Diastolic dysfunction 06/20/1759  . Diverticulitis   . Glaucoma   . Hernia   . History of colonic diverticulitis 09/04/2010  . Hyperlipidemia   . Hypertension   . Psoriasis 09/04/2010    Past Surgical History:    Procedure Laterality Date  . broken legs  1981 and 1995  . PERIPHERAL VASCULAR CATHETERIZATION N/A 10/25/2014   Procedure: Abdominal Aortogram;  Surgeon: Wellington Hampshire, MD;  Location: [facility];  Service: Cardiovascular;  Laterality: N/A;  . PERIPHERAL VASCULAR CATHETERIZATION Bilateral 10/25/2014   Procedure: Lower Extremity Angiography;  Surgeon: Wellington Hampshire, MD;  Location: [facility];  Service: Cardiovascular;  Laterality: Bilateral;  . s/p lipoma  2003  . TONSILLECTOMY  1952     Current Outpatient Prescriptions  Medication Sig Dispense Refill  . acetaminophen-codeine (TYLENOL #3) 300-30 MG per tablet Take 1 tablet by mouth every 6 (six) hours as needed for moderate pain. 60 tablet 0  . aspirin EC 81 MG tablet Take 81 mg by mouth daily.    Marland Kitchen atorvastatin (LIPITOR) 20 MG tablet TAKE 1 TABLET (20 MG TOTAL) BY MOUTH DAILY. 90 tablet 1  . bromocriptine (PARLODEL) 2.5 MG tablet TAKE 1/2 TABLET AT BEDTIME 45 tablet 0  . ciclopirox (LOPROX) 0.77 % cream Apply 1 application topically daily as needed.    . cilostazol (PLETAL) 100 MG tablet TAKE 1 TABLET BY MOUTH TWICE A DAY 180 tablet 1  . clobetasol (TEMOVATE) 0.05 % external solution Apply topically 2 (two) times daily. 50 mL 1  . fosinopril (MONOPRIL) 20 MG tablet TAKE 1 TABLET (20 MG TOTAL) BY MOUTH DAILY. 90 tablet 2  . glucose blood (ONETOUCH VERIO) test strip 1 each by Other route daily. And lancets 1/day 250.00 100  each 12  . isosorbide mononitrate (IMDUR) 60 MG 24 hr tablet Take 0.5 tablets (30 mg total) by mouth daily. 45 tablet 3  . metFORMIN (GLUCOPHAGE) 500 MG tablet TAKE 2 TABLETS IN THE MORNING 1 TABLET AT NOON AND 2 TABLETS IN THE EVENING 150 tablet 0  . metoprolol succinate (TOPROL-XL) 50 MG 24 hr tablet TAKE 1 TABLET (50 MG TOTAL) BY MOUTH DAILY. TAKE WITH OR IMMEDIATELY FOLLOWING A MEAL. 90 tablet 3  . repaglinide (PRANDIN) 0.5 MG tablet Take 1 tablet (0.5 mg total) by mouth 3 (three) times daily before  meals. 90 tablet 11  . TRAVATAN Z 0.004 % SOLN ophthalmic solution Place 1 drop into both eyes at bedtime.  4   No current facility-administered medications for this visit.     Allergies:   Levaquin [levofloxacin in d5w] and Tramadol    Social History:  The patient  reports that he has never smoked. He has never used smokeless tobacco. He reports that he does not drink alcohol or use drugs.   Family History:  The patient's family history includes Arthritis in his father and mother; Cancer in his other; Heart disease in his father and mother; Hypertension in his father and mother.    ROS:  Please see the history of present illness.   Otherwise, review of systems are positive for none.   All other systems are reviewed and negative.    PHYSICAL EXAM: VS:  BP 136/72   Pulse 90   Ht 5\' 5"  (1.651 m)   Wt 145 lb (65.8 kg)   BMI 24.13 kg/m  , BMI Body mass index is 24.13 kg/m. GEN: Well nourished, well developed, in no acute distress  HEENT: normal  Neck: no JVD, carotid bruits, or masses Cardiac: RRR; no murmurs, rubs, or gallops,no edema  Respiratory:  clear to auscultation bilaterally, normal work of breathing GI: soft, nontender, nondistended, + BS MS: no deformity or atrophy  Skin: warm and dry, no rash Neuro:  Strength and sensation are intact Psych: euthymic mood, full affect Distal pulses are not palpable.   EKG:  EKG is not ordered today.   Recent Labs: 03/25/2016: ALT 11; BUN 22; Creatinine, Ser 0.99; Hemoglobin 12.8; Platelets 187.0; Potassium 4.3; Sodium 140; TSH 2.21    Lipid Panel    Component Value Date/Time   CHOL 141 03/25/2016 1022   TRIG 99.0 03/25/2016 1022   HDL 43.00 03/25/2016 1022   CHOLHDL 3 03/25/2016 1022   VLDL 19.8 03/25/2016 1022   LDLCALC 78 03/25/2016 1022   LDLDIRECT 114.4 05/13/2012 1417      Wt Readings from Last 3 Encounters:  01/13/17 145 lb (65.8 kg)  10/20/16 136 lb 12.8 oz (62.1 kg)  09/23/16 141 lb (64 kg)        ASSESSMENT AND PLAN:  1. Peripheral arterial disease with claudication:  He Denies any claudication at the present time. Continue medical therapy with cilostazol.  2. Hyperlipidemia: Continue treatment with atorvastatin. Most recent LDL was 78 and December 2017.  3. Coronary artery disease involving native coronary arteries without angina: Continue medical therapy.   4. Essential hypertension: Blood pressure is controlled on current medications.   Disposition:   FU with me in 6 months  Signed,  [name], MD  01/13/2017 9:44 AM    [facility]

## 2017-01-13 NOTE — Patient Instructions (Addendum)
Medication Instructions:  Continue current medications  If you need a refill on your cardiac medications before your next appointment, please call your pharmacy.  Labwork: None Ordered   Testing/Procedures: None Ordered  Follow-Up: Your physician wants you to follow-up in:6 Months.   Thank you for choosing [facility] at [facility]!!

## 2017-01-21 ENCOUNTER — Other Ambulatory Visit: Payer: Self-pay | Admitting: Internal Medicine

## 2017-02-11 DIAGNOSIS — L570 Actinic keratosis: Secondary | ICD-10-CM | POA: Diagnosis not present

## 2017-02-11 DIAGNOSIS — L818 Other specified disorders of pigmentation: Secondary | ICD-10-CM | POA: Diagnosis not present

## 2017-02-11 DIAGNOSIS — X32XXXA Exposure to sunlight, initial encounter: Secondary | ICD-10-CM | POA: Diagnosis not present

## 2017-02-11 DIAGNOSIS — L400 Psoriasis vulgaris: Secondary | ICD-10-CM | POA: Diagnosis not present

## 2017-02-11 DIAGNOSIS — L814 Other melanin hyperpigmentation: Secondary | ICD-10-CM | POA: Diagnosis not present

## 2017-02-20 ENCOUNTER — Ambulatory Visit: Payer: Medicare Other | Admitting: Endocrinology

## 2017-03-25 ENCOUNTER — Ambulatory Visit: Payer: Self-pay | Admitting: *Deleted

## 2017-03-25 NOTE — Telephone Encounter (Signed)
Pts   Daughter  renae  Called   Stating  Her father  Was   Having   Some  Back  Pain     And  That  She  Wanted  An  Appointment  Made    For  Him  Pt called   And  Triage   Initiated .   Pt  Reports  Has  Had  Upper  Back  Pain   For  A  Long time   And   Last night  He  Had   Some  Upper back  And  r  Shoulder  Pain   As   Well . Pt  Denies  Any  Chest pain  Or  Shortness of breath  .   He  Is  Alert and  Oriented   No  Availability  With Dr   Jenny Reichmann  Appointment  Made  For  2  Days  With  Jodi Mourning . Pt  Advised  To  Call  911  If  Any chest pain or  Shortness  Of  Breath  Reason for Disposition . [1] MODERATE back pain (e.g., interferes with normal activities) AND [2] present > 3 days  Answer Assessment - Initial Assessment Questions 1. ONSET: "When did the pain begin?"       Has  Had  Pain   For   Quite   A   While  Pain    Worse  Last  Night    2. LOCATION: "Where does it hurt?" (upper, mid or lower back)      r   Upper back    And   r    Shoulder     3. SEVERITY: "How bad is the pain?"  (e.g., Scale 1-10; mild, moderate, or severe)   - MILD (1-3): doesn't interfere with normal activities    - MODERATE (4-7): interferes with normal activities or awakens from sleep    - SEVERE (8-10): excruciating pain, unable to do any normal activities       No  Pain   Last   Night it  Was   A  4      4. PATTERN: "Is the pain constant?" (e.g., yes, no; constant, intermittent)       Pt   Comes   And  Goes     5. RADIATION: "Does the pain shoot into your legs or elsewhere?"      No     Denies     6. CAUSE:  "What do you think is causing the back pain?"        Has    Had   Back pain in  Past    7. BACK OVERUSE:  "Any recent lifting of heavy objects, strenuous work or exercise?"        Has  Been hanging   Ornaments   Recently      8. MEDICATIONS: "What have you taken so far for the pain?" (e.g., nothing, acetaminophen, NSAIDS)        No   9. NEUROLOGIC SYMPTOMS: "Do you have any weakness, numbness, or  problems with bowel/bladder control?"     No 10. OTHER SYMPTOMS: "Do you have any other symptoms?" (e.g., fever, abdominal pain, burning with urination, blood in urine)       occasonal   Constipation    11. PREGNANCY: "Is there any chance you are pregnant?" (e.g., yes, no; LMP)  No  Protocols used: BACK PAIN-A-AH

## 2017-03-27 ENCOUNTER — Ambulatory Visit: Payer: Medicare Other | Admitting: Family

## 2017-04-14 DIAGNOSIS — H401132 Primary open-angle glaucoma, bilateral, moderate stage: Secondary | ICD-10-CM | POA: Diagnosis not present

## 2017-04-14 DIAGNOSIS — H53431 Sector or arcuate defects, right eye: Secondary | ICD-10-CM | POA: Diagnosis not present

## 2017-04-14 DIAGNOSIS — H53432 Sector or arcuate defects, left eye: Secondary | ICD-10-CM | POA: Diagnosis not present

## 2017-06-01 DIAGNOSIS — Z6827 Body mass index (BMI) 27.0-27.9, adult: Secondary | ICD-10-CM | POA: Diagnosis not present

## 2017-06-01 DIAGNOSIS — I10 Essential (primary) hypertension: Secondary | ICD-10-CM | POA: Diagnosis not present

## 2017-06-01 DIAGNOSIS — B351 Tinea unguium: Secondary | ICD-10-CM | POA: Diagnosis not present

## 2017-06-11 ENCOUNTER — Other Ambulatory Visit: Payer: Self-pay | Admitting: Internal Medicine

## 2017-06-12 ENCOUNTER — Encounter: Payer: Self-pay | Admitting: Endocrinology

## 2017-06-12 ENCOUNTER — Ambulatory Visit (INDEPENDENT_AMBULATORY_CARE_PROVIDER_SITE_OTHER): Payer: Medicare Other | Admitting: Endocrinology

## 2017-06-12 VITALS — BP 146/68 | HR 77 | Wt 141.4 lb

## 2017-06-12 DIAGNOSIS — E1151 Type 2 diabetes mellitus with diabetic peripheral angiopathy without gangrene: Secondary | ICD-10-CM | POA: Diagnosis not present

## 2017-06-12 LAB — POCT GLYCOSYLATED HEMOGLOBIN (HGB A1C): Hemoglobin A1C: 8.2

## 2017-06-12 MED ORDER — REPAGLINIDE 1 MG PO TABS
1.0000 mg | ORAL_TABLET | Freq: Three times a day (TID) | ORAL | 11 refills | Status: DC
Start: 2017-06-12 — End: 2017-12-22

## 2017-06-12 MED ORDER — METFORMIN HCL ER 500 MG PO TB24: 1000.0000 mg | ORAL_TABLET | Freq: Two times a day (BID) | ORAL | 11 refills | Status: DC

## 2017-06-12 NOTE — Progress Notes (Signed)
Subjective:    Patient ID: Jeffrey Kaiser, male    DOB: Jan 21, 1926, 84 y.o.   MRN: [id number]  HPI Pt returns for f/u of diabetes mellitus:  DM type: 2 Dx'ed: 9030 Complications: retinopathy, renal insufficiency, CAD, and PAD.  Therapy: 3 oral meds.  DKA: never.   Severe hypoglycemia: never.  Pancreatitis: never.   Other: he has never been on insulin; invokana rx is limited by hyperkalemia.   Interval history: no cbg record, but states cbg's are well-controlled.   Past Medical History:  Diagnosis Date  . Anemia 09/04/2010  . Arthritis   . BPH (benign prostatic hyperplasia) 09/09/2010  . CAD (coronary artery disease)   . DDD (degenerative disc disease), cervical 09/04/2010  . Diabetes mellitus   . Diastolic dysfunction 0/92/3300  . Diverticulitis   . Glaucoma   . Hernia   . History of colonic diverticulitis 09/04/2010  . Hyperlipidemia   . Hypertension   . Psoriasis 09/04/2010    Past Surgical History:  Procedure Laterality Date  . broken legs  1981 and 1995  . PERIPHERAL VASCULAR CATHETERIZATION N/A 10/25/2014   Procedure: Abdominal Aortogram;  Surgeon: Wellington Hampshire, MD;  Location: [facility];  Service: Cardiovascular;  Laterality: N/A;  . PERIPHERAL VASCULAR CATHETERIZATION Bilateral 10/25/2014   Procedure: Lower Extremity Angiography;  Surgeon: Wellington Hampshire, MD;  Location: [facility];  Service: Cardiovascular;  Laterality: Bilateral;  . s/p lipoma  2003  . TONSILLECTOMY  1952    Social History   Socioeconomic History  . Marital status: Single    Spouse name: Not on file  . Number of children: Not on file  . Years of education: Not on file  . Highest education level: Not on file  Social Needs  . Financial resource strain: Not on file  . Food insecurity - worry: Not on file  . Food insecurity - inability: Not on file  . Transportation needs - medical: Not on file  . Transportation needs - non-medical: Not on file  Occupational History  .  Occupation: retired  Tobacco Use  . Smoking status: Never Smoker  . Smokeless tobacco: Never Used  Substance and Sexual Activity  . Alcohol use: No  . Drug use: No  . Sexual activity: No  Other Topics Concern  . Not on file  Social History Narrative  . Not on file    Current Outpatient Medications on File Prior to Visit  Medication Sig Dispense Refill  . acetaminophen-codeine (TYLENOL #3) 300-30 MG per tablet Take 1 tablet by mouth every 6 (six) hours as needed for moderate pain. 60 tablet 0  . aspirin EC 81 MG tablet Take 81 mg by mouth daily.    Marland Kitchen atorvastatin (LIPITOR) 20 MG tablet TAKE 1 TABLET (20 MG TOTAL) BY MOUTH DAILY. 90 tablet 2  . bromocriptine (PARLODEL) 2.5 MG tablet TAKE 1/2 TABLET AT BEDTIME 45 tablet 0  . ciclopirox (LOPROX) 0.77 % cream Apply 1 application topically daily as needed.    . cilostazol (PLETAL) 100 MG tablet TAKE 1 TABLET BY MOUTH TWICE A DAY 180 tablet 1  . clobetasol (TEMOVATE) 0.05 % external solution Apply topically 2 (two) times daily. 50 mL 1  . fosinopril (MONOPRIL) 20 MG tablet TAKE 1 TABLET (20 MG TOTAL) BY MOUTH DAILY. 90 tablet 2  . glucose blood (ONETOUCH VERIO) test strip 1 each by Other route daily. And lancets 1/day 250.00 100 each 12  . isosorbide mononitrate (IMDUR) 60 MG 24  hr tablet Take 0.5 tablets (30 mg total) by mouth daily. 45 tablet 3  . metoprolol succinate (TOPROL-XL) 50 MG 24 hr tablet TAKE 1 TABLET (50 MG TOTAL) BY MOUTH DAILY. TAKE WITH OR IMMEDIATELY FOLLOWING A MEAL. 90 tablet 3  . TRAVATAN Z 0.004 % SOLN ophthalmic solution Place 1 drop into both eyes at bedtime.  4   No current facility-administered medications on file prior to visit.     Allergies  Allergen Reactions  . Levaquin [Levofloxacin In D5w] Nausea Only  . Tramadol Other (See Comments)    somnolence    Family History  Problem Relation Age of Onset  . Arthritis Mother   . Heart disease Mother   . Hypertension Mother   . Arthritis Father   . Heart  disease Father   . Hypertension Father   . Cancer Other        lung cancer    BP (!) 146/68 (BP Location: Left Arm, Patient Position: Sitting, Cuff Size: Normal)   Pulse 77   Wt 141 lb 6.4 oz (64.1 kg)   SpO2 98%   BMI 23.53 kg/m    Review of Systems He has nausea, but he denies hypoglycemia.      Objective:   Physical Exam VITAL SIGNS:  See vs page GENERAL: no distress Pulses: foot pulses are intact bilaterally.   MSK: no deformity of the feet or ankles.  CV: 1+ bilat edema of the legs, and bilat vv's.  Skin:  no ulcer on the feet or ankles.  normal color and temp on the feet and ankles Neuro: sensation is intact to touch on the feet and ankles.   Ext: There is bilateral onychomycosis of the toenails.    Lab Results  Component Value Date   HGBA1C 8.2 06/12/2017   Lab Results  Component Value Date   CREATININE 0.99 03/25/2016   BUN 22 03/25/2016   NA 140 03/25/2016   K 4.3 03/25/2016   CL 103 03/25/2016   CO2 28 03/25/2016      Assessment & Plan:  Type 2 DM, with PAD: worse nausea: this limits rx options and dosages  Patient Instructions  check your blood sugar once a day.  vary the time of day when you check, between before the 3 meals, and at bedtime.  also check if you have symptoms of your blood sugar being too high or too low.  please keep a record of the readings and bring it to your next appointment here.  You can write it on any piece of paper.  please call us sooner if your blood sugar goes below 70, or if you have a lot of readings over 200.   Please change the diabetes medications to those listed below.   Please come back for a follow-up appointment in 4 months.

## 2017-06-12 NOTE — Patient Instructions (Addendum)
check your blood sugar once a day.  vary the time of day when you check, between before the 3 meals, and at bedtime.  also check if you have symptoms of your blood sugar being too high or too low.  please keep a record of the readings and bring it to your next appointment here.  You can write it on any piece of paper.  please call us sooner if your blood sugar goes below 70, or if you have a lot of readings over 200.   Please change the diabetes medications to those listed below.   Please come back for a follow-up appointment in 4 months.

## 2017-06-23 ENCOUNTER — Other Ambulatory Visit: Payer: Self-pay | Admitting: Cardiovascular Disease

## 2017-06-23 NOTE — Telephone Encounter (Signed)
Refill Request.  

## 2017-06-23 NOTE — Telephone Encounter (Signed)
Rx(s) sent to pharmacy electronically.  

## 2017-07-03 ENCOUNTER — Other Ambulatory Visit: Payer: Self-pay | Admitting: Internal Medicine

## 2017-07-03 MED ORDER — ISOSORBIDE MONONITRATE ER 60 MG PO TB24: 30.0000 mg | ORAL_TABLET | Freq: Every day | ORAL | 0 refills | Status: DC

## 2017-07-03 NOTE — Telephone Encounter (Signed)
Copied from [place]. Topic: Quick Communication - Rx Refill/Question >> Jul 03, 2017  1:44 PM Neva Seat wrote: isosorbide mononitrate (IMDUR) 60 MG 24 hr tablet  Pt's Rx expired. Needing refills  [id number] [address], [facility] [place] [address] Phone: [phone] Fax: [phone]

## 2017-07-21 ENCOUNTER — Encounter: Payer: Self-pay | Admitting: Cardiovascular Disease

## 2017-07-21 ENCOUNTER — Ambulatory Visit (INDEPENDENT_AMBULATORY_CARE_PROVIDER_SITE_OTHER): Payer: Medicare Other | Admitting: Cardiovascular Disease

## 2017-07-21 VITALS — BP 152/70 | HR 81 | Ht 65.0 in | Wt 147.0 lb

## 2017-07-21 DIAGNOSIS — I2510 Atherosclerotic heart disease of native coronary artery without angina pectoris: Secondary | ICD-10-CM | POA: Diagnosis not present

## 2017-07-21 DIAGNOSIS — I10 Essential (primary) hypertension: Secondary | ICD-10-CM

## 2017-07-21 DIAGNOSIS — H53432 Sector or arcuate defects, left eye: Secondary | ICD-10-CM | POA: Diagnosis not present

## 2017-07-21 DIAGNOSIS — H401132 Primary open-angle glaucoma, bilateral, moderate stage: Secondary | ICD-10-CM | POA: Diagnosis not present

## 2017-07-21 DIAGNOSIS — I739 Peripheral vascular disease, unspecified: Secondary | ICD-10-CM

## 2017-07-21 DIAGNOSIS — E7849 Other hyperlipidemia: Secondary | ICD-10-CM | POA: Diagnosis not present

## 2017-07-21 NOTE — Progress Notes (Signed)
Cardiology Office Note   Date:  07/23/2017   ID:  Kaiser, Jeffrey [birth year], MRN [id number]  PCP:  [name], MD  Cardiologist:   [name], MD   No chief complaint on file.     History of Present Illness: Jeffrey Kaiser is a 84 y.o. male who presents for a follow up visit regarding peripheral arterial disease and claudication. He has reported history of coronary artery disease with no available details, prolonged history of diabetes, hyperlipidemia, hypertension and arthritis. He has no history of tobacco use.  He was seen in 2016 for severe left calf claudication. Noninvasive vascular evaluation showed an ABI of 0.93 on the right and 0.31 on the left.  Angiography in 7/16  showed: 1. No significant aortoiliac disease. 2. Moderate distal left common femoral artery stenosis. Mild nonobstructive disease in the left SFA. Subtotal occlusion of the left popliteal artery proximally with 1 vessel runoff below the knee. 3. Mild nonobstructive disease affecting the right SFA and popliteal arteries. 2 vessel runoff below the knee on the right. The patient was treated medically with the addition of cilostazol with significant improvement in symptoms.   He has known history of chronic venous insufficiency without DVT.  He has been doing reasonably well with no recent chest pain or shortness of breath.  He denies any claudication or lower extremity ulceration.  He is mostly bothered by chronic neck pain.   Past Medical History:  Diagnosis Date  . Anemia 09/04/2010  . Arthritis   . BPH (benign prostatic hyperplasia) 09/09/2010  . CAD (coronary artery disease)   . DDD (degenerative disc disease), cervical 09/04/2010  . Diabetes mellitus   . Diastolic dysfunction 9/50/9326  . Diverticulitis   . Glaucoma   . Hernia   . History of colonic diverticulitis 09/04/2010  . Hyperlipidemia   . Hypertension   . Psoriasis 09/04/2010    Past Surgical History:  Procedure Laterality Date    . broken legs  1981 and 1995  . PERIPHERAL VASCULAR CATHETERIZATION N/A 10/25/2014   Procedure: Abdominal Aortogram;  Surgeon: Wellington Hampshire, MD;  Location: [facility];  Service: Cardiovascular;  Laterality: N/A;  . PERIPHERAL VASCULAR CATHETERIZATION Bilateral 10/25/2014   Procedure: Lower Extremity Angiography;  Surgeon: Wellington Hampshire, MD;  Location: [facility];  Service: Cardiovascular;  Laterality: Bilateral;  . s/p lipoma  2003  . TONSILLECTOMY  1952     Current Outpatient Medications  Medication Sig Dispense Refill  . acetaminophen-codeine (TYLENOL #3) 300-30 MG per tablet Take 1 tablet by mouth every 6 (six) hours as needed for moderate pain. 60 tablet 0  . aspirin EC 81 MG tablet Take 81 mg by mouth daily.    Marland Kitchen atorvastatin (LIPITOR) 20 MG tablet TAKE 1 TABLET (20 MG TOTAL) BY MOUTH DAILY. 90 tablet 2  . bromocriptine (PARLODEL) 2.5 MG tablet TAKE 1/2 TABLET AT BEDTIME 45 tablet 0  . ciclopirox (LOPROX) 0.77 % cream Apply 1 application topically daily as needed.    . cilostazol (PLETAL) 100 MG tablet TAKE 1 TABLET BY MOUTH TWICE A DAY 180 tablet 1  . clobetasol (TEMOVATE) 0.05 % external solution Apply topically 2 (two) times daily. 50 mL 1  . fosinopril (MONOPRIL) 20 MG tablet TAKE 1 TABLET (20 MG TOTAL) BY MOUTH DAILY. 90 tablet 2  . glucose blood (ONETOUCH VERIO) test strip 1 each by Other route daily. And lancets 1/day 250.00 100 each 12  . isosorbide mononitrate (  IMDUR) 60 MG 24 hr tablet Take 0.5 tablets (30 mg total) by mouth daily. 60 tablet 0  . metFORMIN (GLUCOPHAGE-XR) 500 MG 24 hr tablet Take 2 tablets (1,000 mg total) by mouth 2 (two) times daily. 120 tablet 11  . metoprolol succinate (TOPROL-XL) 50 MG 24 hr tablet TAKE 1 TABLET BY MOUTH DAILY WITH OR IMMEDIATELY FOLLOWING A MEAL 90 tablet 2  . repaglinide (PRANDIN) 1 MG tablet Take 1 tablet (1 mg total) by mouth 3 (three) times daily before meals. 90 tablet 11  . TRAVATAN Z 0.004 % SOLN ophthalmic  solution Place 1 drop into both eyes at bedtime.  4   No current facility-administered medications for this visit.     Allergies:   Levaquin [levofloxacin in d5w] and Tramadol    Social History:  The patient  reports that he has never smoked. He has never used smokeless tobacco. He reports that he does not drink alcohol or use drugs.   Family History:  The patient's family history includes Arthritis in his father and mother; Cancer in his other; Heart disease in his father and mother; Hypertension in his father and mother.    ROS:  Please see the history of present illness.   Otherwise, review of systems are positive for none.   All other systems are reviewed and negative.    PHYSICAL EXAM: VS:  BP (!) 152/70   Pulse 81   Ht 5\' 5"  (1.651 m)   Wt 147 lb (66.7 kg)   BMI 24.46 kg/m  , BMI Body mass index is 24.46 kg/m. GEN: Well nourished, well developed, in no acute distress  HEENT: normal  Neck: no JVD, carotid bruits, or masses Cardiac: RRR; no murmurs, rubs, or gallops,no edema  Respiratory:  clear to auscultation bilaterally, normal work of breathing GI: soft, nontender, nondistended, + BS MS: no deformity or atrophy  Skin: warm and dry, no rash Neuro:  Strength and sensation are intact Psych: euthymic mood, full affect Distal pulses are not palpable.   EKG:  EKG is  ordered today. EKG showed normal sinus rhythm with lateral T wave changes suggestive of ischemia.  Recent Labs: No results found for requested labs within last 8760 hours.    Lipid Panel    Component Value Date/Time   CHOL 141 03/25/2016 1022   TRIG 99.0 03/25/2016 1022   HDL 43.00 03/25/2016 1022   CHOLHDL 3 03/25/2016 1022   VLDL 19.8 03/25/2016 1022   LDLCALC 78 03/25/2016 1022   LDLDIRECT 114.4 05/13/2012 1417      Wt Readings from Last 3 Encounters:  07/21/17 147 lb (66.7 kg)  06/12/17 141 lb 6.4 oz (64.1 kg)  01/13/17 145 lb (65.8 kg)       ASSESSMENT AND PLAN:  1. Peripheral  arterial disease with claudication:  He Denies any claudication at the present time.  No evidence of lower extremity ulceration.  Continue medical therapy with cilostazol.  2. Hyperlipidemia: Continue treatment with atorvastatin. Most recent LDL was 78 in December 2017.  3. Coronary artery disease involving native coronary arteries without angina: Continue medical therapy.   4. Essential hypertension: Blood pressure is mildly elevated but acceptable for his age.  Disposition:   FU with me in 6 months  Signed,  [name], MD  07/23/2017 7:56 AM    [place]

## 2017-07-21 NOTE — Patient Instructions (Signed)

## 2017-07-28 ENCOUNTER — Telehealth: Payer: Self-pay | Admitting: Endocrinology

## 2017-07-28 NOTE — Telephone Encounter (Signed)
[employer] is calling in regards to the paperwork that was faxed over to them for the patients DM shoes. They are Refaxing this because there is one insert that needs to be fixed.   They are going to refax this back over to [place]  [place] [phone]

## 2017-07-29 ENCOUNTER — Telehealth: Payer: Self-pay | Admitting: Endocrinology

## 2017-07-29 NOTE — Telephone Encounter (Signed)
Merleen Nicely calling from [employer] is calling on the status of a form for patient shoes, asking if Dr Loanne Drilling would select one insert for the type of shoe for this patient.and re date the form please,  phone# [phone]

## 2017-07-29 NOTE — Telephone Encounter (Signed)
I left message with [employer] that Dr. Loanne Drilling was out of the office until Monday. We did receive paperwork & it would be reviewed then.

## 2017-08-13 ENCOUNTER — Ambulatory Visit: Payer: Medicare Other

## 2017-08-25 ENCOUNTER — Telehealth: Payer: Self-pay | Admitting: Internal Medicine

## 2017-08-25 MED ORDER — ISOSORBIDE MONONITRATE ER 60 MG PO TB24: 30.0000 mg | ORAL_TABLET | Freq: Every day | ORAL | 0 refills | Status: DC

## 2017-08-25 NOTE — Telephone Encounter (Addendum)
Copied from [place] [phone]. Topic: Quick Communication - Rx Refill/Question >> Aug 25, 2017  1:00 PM [name] wrote: Medication: isosorbide mononitrate 60 mg #30 pt has an appt on 08-27-17 Has the patient contacted their pharmacy no [facility] [place]

## 2017-08-25 NOTE — Telephone Encounter (Signed)
Per office policy sent 30 day to local pharmacy until appt.../lmb  

## 2017-08-27 ENCOUNTER — Ambulatory Visit (INDEPENDENT_AMBULATORY_CARE_PROVIDER_SITE_OTHER): Payer: Medicare Other | Admitting: Internal Medicine

## 2017-08-27 ENCOUNTER — Other Ambulatory Visit (INDEPENDENT_AMBULATORY_CARE_PROVIDER_SITE_OTHER): Payer: Medicare Other

## 2017-08-27 ENCOUNTER — Encounter: Payer: Self-pay | Admitting: Internal Medicine

## 2017-08-27 VITALS — BP 142/82 | HR 96 | Temp 98.2°F | Ht 65.0 in | Wt 145.0 lb

## 2017-08-27 DIAGNOSIS — M542 Cervicalgia: Secondary | ICD-10-CM

## 2017-08-27 DIAGNOSIS — I2510 Atherosclerotic heart disease of native coronary artery without angina pectoris: Secondary | ICD-10-CM

## 2017-08-27 DIAGNOSIS — I10 Essential (primary) hypertension: Secondary | ICD-10-CM

## 2017-08-27 DIAGNOSIS — E7849 Other hyperlipidemia: Secondary | ICD-10-CM | POA: Diagnosis not present

## 2017-08-27 DIAGNOSIS — E1151 Type 2 diabetes mellitus with diabetic peripheral angiopathy without gangrene: Secondary | ICD-10-CM

## 2017-08-27 LAB — HEPATIC FUNCTION PANEL
ALT: 8 U/L (ref 0–53)
AST: 15 U/L (ref 0–37)
Albumin: 3.9 g/dL (ref 3.5–5.2)
Alkaline Phosphatase: 55 U/L (ref 39–117)
BILIRUBIN DIRECT: 0.2 mg/dL (ref 0.0–0.3)
BILIRUBIN TOTAL: 0.6 mg/dL (ref 0.2–1.2)
Total Protein: 7.4 g/dL (ref 6.0–8.3)

## 2017-08-27 LAB — LIPID PANEL
CHOL/HDL RATIO: 3
Cholesterol: 139 mg/dL (ref 0–200)
HDL: 44.5 mg/dL (ref >39.00)
LDL Cholesterol: 79 mg/dL (ref 0–99)
NONHDL: 94.74
Triglycerides: 78 mg/dL (ref 0.0–149.0)
VLDL: 15.6 mg/dL (ref 0.0–40.0)

## 2017-08-27 LAB — BASIC METABOLIC PANEL
BUN: 19 mg/dL (ref 6–23)
CALCIUM: 9.5 mg/dL (ref 8.4–10.5)
CO2: 28 meq/L (ref 19–32)
Chloride: 102 mEq/L (ref 96–112)
Creatinine, Ser: 1.02 mg/dL (ref 0.40–1.50)
GFR: 72.9 mL/min (ref >60.00)
Glucose, Bld: 177 mg/dL — ABNORMAL HIGH (ref 70–99)
POTASSIUM: 4.2 meq/L (ref 3.5–5.1)
SODIUM: 139 meq/L (ref 135–145)

## 2017-08-27 LAB — CBC WITH DIFFERENTIAL/PLATELET
Basophils Absolute: 0 10*3/uL (ref 0.0–0.1)
Basophils Relative: 0.4 % (ref 0.0–3.0)
EOS ABS: 0.1 10*3/uL (ref 0.0–0.7)
Eosinophils Relative: 1 % (ref 0.0–5.0)
HCT: 35.6 % — ABNORMAL LOW (ref 39.0–52.0)
HEMOGLOBIN: 11.8 g/dL — AB (ref 13.0–17.0)
LYMPHS ABS: 1.6 10*3/uL (ref 0.7–4.0)
Lymphocytes Relative: 19.5 % (ref 12.0–46.0)
MCHC: 33.2 g/dL (ref 30.0–36.0)
MCV: 83.1 fl (ref 78.0–100.0)
MONO ABS: 0.6 10*3/uL (ref 0.1–1.0)
Monocytes Relative: 7.8 % (ref 3.0–12.0)
NEUTROS PCT: 71.3 % (ref 43.0–77.0)
Neutro Abs: 5.8 10*3/uL (ref 1.4–7.7)
Platelets: 208 10*3/uL (ref 150.0–400.0)
RBC: 4.28 Mil/uL (ref 4.22–5.81)
RDW: 14.9 % (ref 11.5–15.5)
WBC: 8.2 10*3/uL (ref 4.0–10.5)

## 2017-08-27 LAB — URINALYSIS, ROUTINE W REFLEX MICROSCOPIC
Hgb urine dipstick: NEGATIVE
Ketones, ur: 15 — AB
Leukocytes, UA: NEGATIVE
Nitrite: NEGATIVE
Urine Glucose: NEGATIVE
Urobilinogen, UA: 0.2 (ref 0.0–1.0)
pH: 5.5 (ref 5.0–8.0)

## 2017-08-27 LAB — TSH: TSH: 2.53 u[IU]/mL (ref 0.35–4.50)

## 2017-08-27 MED ORDER — TIZANIDINE HCL 2 MG PO TABS
2.0000 mg | ORAL_TABLET | Freq: Four times a day (QID) | ORAL | 1 refills | Status: DC | PRN
Start: 2017-08-27 — End: 2018-07-27

## 2017-08-27 NOTE — Assessment & Plan Note (Signed)
stable overall by history and exam, recent data reviewed with pt, and pt to continue medical treatment as before,  to f/u any worsening symptoms or concerns Lab Results  Component Value Date   LDLCALC 78 03/25/2016

## 2017-08-27 NOTE — Assessment & Plan Note (Signed)
Mild to mod, for muscle relaxer prn,  to f/u any worsening symptoms or concerns with sports medicine in this office if not improved

## 2017-08-27 NOTE — Assessment & Plan Note (Signed)
Fair control, o/w stable overall by history and exam, recent data reviewed with pt, and pt to continue medical treatment as before,  to f/u any worsening symptoms or concerns. For lab f/u today, cont f/u endo

## 2017-08-27 NOTE — Addendum Note (Signed)
Addended by: [name] on: 08/27/2017 02:14 PM   Modules accepted: Orders

## 2017-08-27 NOTE — Assessment & Plan Note (Signed)
BP Readings from Last 3 Encounters:  08/27/17 (!) 142/82  07/21/17 (!) 152/70  06/12/17 (!) 146/68  borderline mild elevated, pt declines med change, o/w stable overall by history and exam, recent data reviewed with pt, and pt to continue medical treatment as before,  to f/u any worsening symptoms or concerns

## 2017-08-27 NOTE — Patient Instructions (Signed)
Please continue all other medications as before, and refills have been done if requested.  Please have the pharmacy call with any other refills you may need.  Please continue your efforts at being more active, low cholesterol diet, and weight control.  You are otherwise up to date with prevention measures today.  Please keep your appointments with your specialists as you may have planned  Please go to the LAB in the Basement (turn left off the elevator) for the tests to be done today  You will be contacted by phone if any changes need to be made immediately.  Otherwise, you will receive a letter about your results with an explanation, but please check with MyChart first.  Please remember to sign up for MyChart if you have not done so, as this will be important to you in the future with finding out test results, communicating by private email, and scheduling acute appointments online when needed.  Please return in 1 year for your yearly visit, or sooner if needed  We can see if Sharee Pimple can see you today for a wellness visit

## 2017-08-27 NOTE — Progress Notes (Signed)
Subjective:    Patient ID: Jeffrey Kaiser, male    DOB: March 17, 1926, 84 y.o.   MRN: [id number]  HPI  Here to f/u; overall doing ok,  Pt denies chest pain, increasing sob or doe, wheezing, orthopnea, PND, increased LE swelling, palpitations, dizziness or syncope.  Pt denies new neurological symptoms such as new headache, or facial or extremity weakness or numbness.  Pt denies polydipsia, polyuria, or low sugar episode.  Pt states overall good compliance with meds, mostly trying to follow appropriate diet, with wt overall stable,  but little exercise however. Peak wt has been 172, has been working on diet.  Has been seeing Dr Rocco Pauls for DM, stable  Only c/o is of left post lat neck spasm and pain without radiation, worse to turn head to left, nothing else makes better or worse, mild to mod, 3 wks, sharp Wt Readings from Last 3 Encounters:  08/27/17 145 lb (65.8 kg)  07/21/17 147 lb (66.7 kg)  06/12/17 141 lb 6.4 oz (64.1 kg)   Past Medical History:  Diagnosis Date  . Anemia 09/04/2010  . Arthritis   . BPH (benign prostatic hyperplasia) 09/09/2010  . CAD (coronary artery disease)   . DDD (degenerative disc disease), cervical 09/04/2010  . Diabetes mellitus   . Diastolic dysfunction 3/76/2831  . Diverticulitis   . Glaucoma   . Hernia   . History of colonic diverticulitis 09/04/2010  . Hyperlipidemia   . Hypertension   . Psoriasis 09/04/2010   Past Surgical History:  Procedure Laterality Date  . broken legs  1981 and 1995  . PERIPHERAL VASCULAR CATHETERIZATION N/A 10/25/2014   Procedure: Abdominal Aortogram;  Surgeon: Wellington Hampshire, MD;  Location: [facility];  Service: Cardiovascular;  Laterality: N/A;  . PERIPHERAL VASCULAR CATHETERIZATION Bilateral 10/25/2014   Procedure: Lower Extremity Angiography;  Surgeon: Wellington Hampshire, MD;  Location: [facility];  Service: Cardiovascular;  Laterality: Bilateral;  . s/p lipoma  2003  . TONSILLECTOMY  1952    reports that he  has never smoked. He has never used smokeless tobacco. He reports that he does not drink alcohol or use drugs. family history includes Arthritis in his father and mother; Cancer in his other; Heart disease in his father and mother; Hypertension in his father and mother. Allergies  Allergen Reactions  . Levaquin [Levofloxacin In D5w] Nausea Only  . Tramadol Other (See Comments)    somnolence  \ Current Outpatient Medications on File Prior to Visit  Medication Sig Dispense Refill  . acetaminophen-codeine (TYLENOL #3) 300-30 MG per tablet Take 1 tablet by mouth every 6 (six) hours as needed for moderate pain. 60 tablet 0  . aspirin EC 81 MG tablet Take 81 mg by mouth daily.    Marland Kitchen atorvastatin (LIPITOR) 20 MG tablet TAKE 1 TABLET (20 MG TOTAL) BY MOUTH DAILY. 90 tablet 2  . bromocriptine (PARLODEL) 2.5 MG tablet TAKE 1/2 TABLET AT BEDTIME 45 tablet 0  . ciclopirox (LOPROX) 0.77 % cream Apply 1 application topically daily as needed.    . cilostazol (PLETAL) 100 MG tablet TAKE 1 TABLET BY MOUTH TWICE A DAY 180 tablet 1  . clobetasol (TEMOVATE) 0.05 % external solution Apply topically 2 (two) times daily. 50 mL 1  . fosinopril (MONOPRIL) 20 MG tablet TAKE 1 TABLET (20 MG TOTAL) BY MOUTH DAILY. 90 tablet 2  . glucose blood (ONETOUCH VERIO) test strip 1 each by Other route daily. And lancets 1/day 250.00 100 each 12  .  isosorbide mononitrate (IMDUR) 60 MG 24 hr tablet Take 0.5 tablets (30 mg total) by mouth daily. Must keep scheduled appt for future refills 15 tablet 0  . metFORMIN (GLUCOPHAGE-XR) 500 MG 24 hr tablet Take 2 tablets (1,000 mg total) by mouth 2 (two) times daily. 120 tablet 11  . metoprolol succinate (TOPROL-XL) 50 MG 24 hr tablet TAKE 1 TABLET BY MOUTH DAILY WITH OR IMMEDIATELY FOLLOWING A MEAL 90 tablet 2  . repaglinide (PRANDIN) 1 MG tablet Take 1 tablet (1 mg total) by mouth 3 (three) times daily before meals. 90 tablet 11  . TRAVATAN Z 0.004 % SOLN ophthalmic solution Place 1 drop  into both eyes at bedtime.  4   No current facility-administered medications on file prior to visit.    Review of Systems  Constitutional: Negative for other unusual diaphoresis or sweats HENT: Negative for ear discharge or swelling Eyes: Negative for other worsening visual disturbances Respiratory: Negative for stridor or other swelling  Gastrointestinal: Negative for worsening distension or other blood Genitourinary: Negative for retention or other urinary change Musculoskeletal: Negative for other MSK pain or swelling Skin: Negative for color change or other new lesions Neurological: Negative for worsening tremors and other numbness  Psychiatric/Behavioral: Negative for worsening agitation or other fatigue All other system neg per pt    Objective:   Physical Exam BP (!) 142/82   Pulse 96   Temp 98.2 F (36.8 C) (Oral)   Ht 5\' 5"  (1.651 m)   Wt 145 lb (65.8 kg)   SpO2 100%   BMI 24.13 kg/m  VS noted,  Constitutional: Pt appears in NAD HENT: Head: NCAT.  Right Ear: External ear normal.  Left Ear: External ear normal.  Eyes: . Pupils are equal, round, and reactive to light. Conjunctivae and EOM are normal Nose: without d/c or deformity Neck: Neck supple. Gross normal ROM Cardiovascular: Normal rate and regular rhythm.   Pulmonary/Chest: Effort normal and breath sounds without rales or wheezing.  Abd:  Soft, NT, ND, + BS, no organomegaly Left post lat neck muscular spasm tender noted Neurological: Pt is alert. At baseline orientation, motor grossly intact, very good cognition for age Skin: Skin is warm. No rashes, other new lesions, no LE edema Psychiatric: Pt behavior is normal without agitation  No other exam findings Lab Results  Component Value Date   WBC 8.0 03/25/2016   HGB 12.8 (L) 03/25/2016   HCT 38.8 (L) 03/25/2016   PLT 187.0 03/25/2016   GLUCOSE 161 (H) 03/25/2016   CHOL 141 03/25/2016   TRIG 99.0 03/25/2016   HDL 43.00 03/25/2016   LDLDIRECT 114.4  05/13/2012   LDLCALC 78 03/25/2016   ALT 11 03/25/2016   AST 16 03/25/2016   NA 140 03/25/2016   K 4.3 03/25/2016   CL 103 03/25/2016   CREATININE 0.99 03/25/2016   BUN 22 03/25/2016   CO2 28 03/25/2016   TSH 2.21 03/25/2016   INR 1.1 (H) 10/17/2014   HGBA1C 8.2 06/12/2017   MICROALBUR 1.5 10/05/2014       Assessment & Plan:

## 2017-09-09 DIAGNOSIS — D3131 Benign neoplasm of right choroid: Secondary | ICD-10-CM | POA: Diagnosis not present

## 2017-09-09 DIAGNOSIS — H401132 Primary open-angle glaucoma, bilateral, moderate stage: Secondary | ICD-10-CM | POA: Diagnosis not present

## 2017-09-09 DIAGNOSIS — H524 Presbyopia: Secondary | ICD-10-CM | POA: Diagnosis not present

## 2017-09-09 DIAGNOSIS — H35363 Drusen (degenerative) of macula, bilateral: Secondary | ICD-10-CM | POA: Diagnosis not present

## 2017-09-09 DIAGNOSIS — H52223 Regular astigmatism, bilateral: Secondary | ICD-10-CM | POA: Diagnosis not present

## 2017-09-09 DIAGNOSIS — E119 Type 2 diabetes mellitus without complications: Secondary | ICD-10-CM | POA: Diagnosis not present

## 2017-10-20 ENCOUNTER — Emergency Department (HOSPITAL_COMMUNITY): Payer: Medicare Other

## 2017-10-20 ENCOUNTER — Encounter (HOSPITAL_COMMUNITY): Payer: Self-pay | Admitting: *Deleted

## 2017-10-20 ENCOUNTER — Other Ambulatory Visit: Payer: Self-pay

## 2017-10-20 ENCOUNTER — Ambulatory Visit: Payer: Medicare Other | Admitting: Endocrinology

## 2017-10-20 ENCOUNTER — Telehealth: Payer: Self-pay

## 2017-10-20 ENCOUNTER — Observation Stay (HOSPITAL_COMMUNITY)
Admission: EM | Admit: 2017-10-20 | Discharge: 2017-10-21 | Disposition: A | Discharge status: Home / Self Care | Payer: Medicare Other | Attending: Internal Medicine | Admitting: Internal Medicine

## 2017-10-20 DIAGNOSIS — E785 Hyperlipidemia, unspecified: Secondary | ICD-10-CM | POA: Insufficient documentation

## 2017-10-20 DIAGNOSIS — R531 Weakness: Secondary | ICD-10-CM | POA: Diagnosis not present

## 2017-10-20 DIAGNOSIS — R2689 Other abnormalities of gait and mobility: Secondary | ICD-10-CM | POA: Insufficient documentation

## 2017-10-20 DIAGNOSIS — I10 Essential (primary) hypertension: Secondary | ICD-10-CM | POA: Diagnosis not present

## 2017-10-20 DIAGNOSIS — G459 Transient cerebral ischemic attack, unspecified: Principal | ICD-10-CM

## 2017-10-20 DIAGNOSIS — E7849 Other hyperlipidemia: Secondary | ICD-10-CM | POA: Diagnosis not present

## 2017-10-20 DIAGNOSIS — R55 Syncope and collapse: Secondary | ICD-10-CM

## 2017-10-20 DIAGNOSIS — E119 Type 2 diabetes mellitus without complications: Secondary | ICD-10-CM

## 2017-10-20 DIAGNOSIS — Z7982 Long term (current) use of aspirin: Secondary | ICD-10-CM | POA: Diagnosis not present

## 2017-10-20 DIAGNOSIS — E1151 Type 2 diabetes mellitus with diabetic peripheral angiopathy without gangrene: Secondary | ICD-10-CM | POA: Diagnosis not present

## 2017-10-20 DIAGNOSIS — I700 Atherosclerosis of aorta: Secondary | ICD-10-CM | POA: Diagnosis not present

## 2017-10-20 DIAGNOSIS — I959 Hypotension, unspecified: Secondary | ICD-10-CM | POA: Diagnosis not present

## 2017-10-20 DIAGNOSIS — Z79899 Other long term (current) drug therapy: Secondary | ICD-10-CM | POA: Insufficient documentation

## 2017-10-20 DIAGNOSIS — I6523 Occlusion and stenosis of bilateral carotid arteries: Secondary | ICD-10-CM | POA: Diagnosis not present

## 2017-10-20 DIAGNOSIS — R1111 Vomiting without nausea: Secondary | ICD-10-CM | POA: Diagnosis not present

## 2017-10-20 DIAGNOSIS — R4020 Unspecified coma: Secondary | ICD-10-CM | POA: Diagnosis not present

## 2017-10-20 DIAGNOSIS — I2510 Atherosclerotic heart disease of native coronary artery without angina pectoris: Secondary | ICD-10-CM | POA: Insufficient documentation

## 2017-10-20 DIAGNOSIS — Z8673 Personal history of transient ischemic attack (TIA), and cerebral infarction without residual deficits: Secondary | ICD-10-CM | POA: Diagnosis present

## 2017-10-20 LAB — CBC
HEMATOCRIT: 35.1 % — AB (ref 39.0–52.0)
HEMOGLOBIN: 10.8 g/dL — AB (ref 13.0–17.0)
MCH: 26.9 pg (ref 26.0–34.0)
MCHC: 30.8 g/dL (ref 30.0–36.0)
MCV: 87.5 fL (ref 78.0–100.0)
Platelets: 197 10*3/uL (ref 150–400)
RBC: 4.01 MIL/uL — AB (ref 4.22–5.81)
RDW: 13.5 % (ref 11.5–15.5)
WBC: 10 10*3/uL (ref 4.0–10.5)

## 2017-10-20 LAB — COMPREHENSIVE METABOLIC PANEL
ALT: 12 U/L (ref 0–44)
AST: 18 U/L (ref 15–41)
Albumin: 3.4 g/dL — ABNORMAL LOW (ref 3.5–5.0)
Alkaline Phosphatase: 50 U/L (ref 38–126)
Anion gap: 12 (ref 5–15)
BUN: 19 mg/dL (ref 8–23)
CALCIUM: 9.2 mg/dL (ref 8.9–10.3)
CO2: 25 mmol/L (ref 22–32)
CREATININE: 1.12 mg/dL (ref 0.61–1.24)
Chloride: 102 mmol/L (ref 98–111)
GFR calc non Af Amer: 56 mL/min — ABNORMAL LOW (ref >60)
Glucose, Bld: 219 mg/dL — ABNORMAL HIGH (ref 70–99)
Potassium: 4.5 mmol/L (ref 3.5–5.1)
Sodium: 139 mmol/L (ref 135–145)
TOTAL PROTEIN: 6.6 g/dL (ref 6.5–8.1)
Total Bilirubin: 0.8 mg/dL (ref 0.3–1.2)

## 2017-10-20 LAB — URINALYSIS, ROUTINE W REFLEX MICROSCOPIC
Bilirubin Urine: NEGATIVE
GLUCOSE, UA: NEGATIVE mg/dL
HGB URINE DIPSTICK: NEGATIVE
KETONES UR: NEGATIVE mg/dL
Leukocytes, UA: NEGATIVE
Nitrite: NEGATIVE
PROTEIN: NEGATIVE mg/dL
Specific Gravity, Urine: 1.045 — ABNORMAL HIGH (ref 1.005–1.030)
pH: 5 (ref 5.0–8.0)

## 2017-10-20 LAB — RAPID URINE DRUG SCREEN, HOSP PERFORMED
AMPHETAMINES: NOT DETECTED
Benzodiazepines: NOT DETECTED
Cocaine: NOT DETECTED
OPIATES: NOT DETECTED
TETRAHYDROCANNABINOL: NOT DETECTED

## 2017-10-20 LAB — ETHANOL: Alcohol, Ethyl (B): <10 mg/dL (ref <10)

## 2017-10-20 LAB — I-STAT CHEM 8, ED
BUN: 22 mg/dL (ref 8–23)
CREATININE: 1 mg/dL (ref 0.61–1.24)
Calcium, Ion: 1.16 mmol/L (ref 1.15–1.40)
Chloride: 101 mmol/L (ref 98–111)
Glucose, Bld: 218 mg/dL — ABNORMAL HIGH (ref 70–99)
HCT: 32 % — ABNORMAL LOW (ref 39.0–52.0)
Hemoglobin: 10.9 g/dL — ABNORMAL LOW (ref 13.0–17.0)
Potassium: 4.4 mmol/L (ref 3.5–5.1)
SODIUM: 139 mmol/L (ref 135–145)
TCO2: 28 mmol/L (ref 22–32)

## 2017-10-20 LAB — DIFFERENTIAL
Abs Immature Granulocytes: 0.1 10*3/uL (ref 0.0–0.1)
Basophils Absolute: 0 10*3/uL (ref 0.0–0.1)
Basophils Relative: 0 %
Eosinophils Absolute: 0 10*3/uL (ref 0.0–0.7)
Eosinophils Relative: 0 %
Immature Granulocytes: 1 %
LYMPHS ABS: 0.8 10*3/uL (ref 0.7–4.0)
LYMPHS PCT: 8 %
MONO ABS: 0.5 10*3/uL (ref 0.1–1.0)
MONOS PCT: 5 %
NEUTROS ABS: 8.7 10*3/uL — AB (ref 1.7–7.7)
Neutrophils Relative %: 86 %

## 2017-10-20 LAB — GLUCOSE, CAPILLARY: GLUCOSE-CAPILLARY: 161 mg/dL — AB (ref 70–99)

## 2017-10-20 LAB — LIPASE, BLOOD: LIPASE: 33 U/L (ref 11–51)

## 2017-10-20 LAB — PROTIME-INR
INR: 1.08
PROTHROMBIN TIME: 13.9 s (ref 11.4–15.2)

## 2017-10-20 LAB — I-STAT TROPONIN, ED: Troponin i, poc: 0.03 ng/mL (ref 0.00–0.08)

## 2017-10-20 LAB — APTT: aPTT: 32 seconds (ref 24–36)

## 2017-10-20 MED ORDER — ACETAMINOPHEN 325 MG PO TABS: 650.0000 mg | ORAL_TABLET | ORAL | Status: DC | PRN

## 2017-10-20 MED ORDER — ACETAMINOPHEN 650 MG RE SUPP: 650.0000 mg | RECTAL | Status: DC | PRN

## 2017-10-20 MED ORDER — SODIUM CHLORIDE 0.9 % IV BOLUS
500.0000 mL | Freq: Once | INTRAVENOUS | Status: AC
  Administered 2017-10-20: 500 mL via INTRAVENOUS

## 2017-10-20 MED ORDER — CILOSTAZOL 50 MG PO TABS
100.0000 mg | ORAL_TABLET | Freq: Two times a day (BID) | ORAL | Status: DC
  Administered 2017-10-20 – 2017-10-21 (×2): 100 mg via ORAL
  Filled 2017-10-20 (×2): qty 2

## 2017-10-20 MED ORDER — LATANOPROST 0.005 % OP SOLN
1.0000 [drp] | Freq: Every day | OPHTHALMIC | Status: DC
  Filled 2017-10-20: qty 2.5

## 2017-10-20 MED ORDER — ACETAMINOPHEN 160 MG/5ML PO SOLN: 650.0000 mg | ORAL | Status: DC | PRN

## 2017-10-20 MED ORDER — ENOXAPARIN SODIUM 40 MG/0.4ML ~~LOC~~ SOLN
40.0000 mg | SUBCUTANEOUS | Status: DC
  Administered 2017-10-20: 40 mg via SUBCUTANEOUS
  Filled 2017-10-20: qty 0.4

## 2017-10-20 MED ORDER — CLOBETASOL PROPIONATE 0.05 % EX SOLN: Freq: Two times a day (BID) | CUTANEOUS | Status: DC

## 2017-10-20 MED ORDER — LISINOPRIL 20 MG PO TABS: 20.0000 mg | ORAL_TABLET | Freq: Every day | ORAL | Status: DC

## 2017-10-20 MED ORDER — ONDANSETRON HCL 4 MG/2ML IJ SOLN
4.0000 mg | Freq: Once | INTRAMUSCULAR | Status: AC
  Administered 2017-10-20: 4 mg via INTRAVENOUS
  Filled 2017-10-20: qty 2

## 2017-10-20 MED ORDER — CICLOPIROX OLAMINE 0.77 % EX CREA: 1.0000 "application " | TOPICAL_CREAM | Freq: Every day | CUTANEOUS | Status: DC

## 2017-10-20 MED ORDER — CHLORPROMAZINE HCL 25 MG PO TABS
25.0000 mg | ORAL_TABLET | Freq: Once | ORAL | Status: AC
  Administered 2017-10-20: 25 mg via ORAL
  Filled 2017-10-20 (×2): qty 1

## 2017-10-20 MED ORDER — ATORVASTATIN CALCIUM 10 MG PO TABS
20.0000 mg | ORAL_TABLET | Freq: Every day | ORAL | Status: DC
  Administered 2017-10-21: 20 mg via ORAL
  Filled 2017-10-20: qty 2

## 2017-10-20 MED ORDER — ISOSORBIDE MONONITRATE ER 30 MG PO TB24
30.0000 mg | ORAL_TABLET | Freq: Every day | ORAL | Status: DC
  Administered 2017-10-21: 30 mg via ORAL
  Filled 2017-10-20: qty 1

## 2017-10-20 MED ORDER — BROMOCRIPTINE MESYLATE 2.5 MG PO TABS
1.2500 mg | ORAL_TABLET | Freq: Every day | ORAL | Status: DC
  Administered 2017-10-20: 1.25 mg via ORAL
  Filled 2017-10-20 (×2): qty 1

## 2017-10-20 MED ORDER — STROKE: EARLY STAGES OF RECOVERY BOOK
Freq: Once | Status: AC
  Administered 2017-10-20: 20:00:00

## 2017-10-20 MED ORDER — METOPROLOL SUCCINATE ER 25 MG PO TB24
50.0000 mg | ORAL_TABLET | Freq: Every day | ORAL | Status: DC
  Administered 2017-10-21: 50 mg via ORAL
  Filled 2017-10-20: qty 2

## 2017-10-20 MED ORDER — INSULIN ASPART 100 UNIT/ML ~~LOC~~ SOLN
0.0000 [IU] | Freq: Three times a day (TID) | SUBCUTANEOUS | Status: DC
  Administered 2017-10-21: 2 [IU] via SUBCUTANEOUS

## 2017-10-20 MED ORDER — SENNOSIDES-DOCUSATE SODIUM 8.6-50 MG PO TABS
1.0000 | ORAL_TABLET | Freq: Every evening | ORAL | Status: DC | PRN
  Administered 2017-10-20: 1 via ORAL
  Filled 2017-10-20: qty 1

## 2017-10-20 MED ORDER — ASPIRIN EC 325 MG PO TBEC
325.0000 mg | DELAYED_RELEASE_TABLET | Freq: Every day | ORAL | Status: DC
  Administered 2017-10-21: 325 mg via ORAL
  Filled 2017-10-20: qty 1

## 2017-10-20 MED ORDER — GI COCKTAIL ~~LOC~~
30.0000 mL | Freq: Once | ORAL | Status: AC
  Administered 2017-10-20: 30 mL via ORAL
  Filled 2017-10-20: qty 30

## 2017-10-20 MED ORDER — IOPAMIDOL (ISOVUE-370) INJECTION 76%
50.0000 mL | Freq: Once | INTRAVENOUS | Status: AC | PRN
  Administered 2017-10-20: 50 mL via INTRAVENOUS

## 2017-10-20 NOTE — ED Provider Notes (Signed)
[facility] EMERGENCY DEPARTMENT Provider Note   CSN: [id number] Arrival date & time: 10/20/17  1029     History   Chief Complaint Chief Complaint  Patient presents with  . Weakness    HPI BABATUNDE SEAGO is a 84 y.o. male history of CAD, diabetes, hyperlipidemia, hypertension here presenting with dizziness, trouble speaking, weakness.  Patient went to his primary care doctor's office this morning.  He states that around 9 AM, he had acute onset of dizziness and also had some trouble speaking.  Patient's family initially told me that he has some left-sided facial droop and left-sided weakness but then told neurology that he had diffuse weakness.  Patient felt like he was going to pass out at that time.  Per the family, patient is not completely back to baseline.  Has no history of strokes in the past.   The history is provided by the patient.    Past Medical History:  Diagnosis Date  . Anemia 09/04/2010  . Arthritis   . BPH (benign prostatic hyperplasia) 09/09/2010  . CAD (coronary artery disease)   . DDD (degenerative disc disease), cervical 09/04/2010  . Diabetes mellitus   . Diastolic dysfunction 4/62/7035  . Diverticulitis   . Glaucoma   . Hernia   . History of colonic diverticulitis 09/04/2010  . Hyperlipidemia   . Hypertension   . Psoriasis 09/04/2010    Patient Active Problem List   Diagnosis Date Noted  . Neck pain 08/27/2017  . Diabetes ([place]) 06/13/2015  . Constipated 04/06/2015  . Tinea cruris 10/31/2014  . PAD (peripheral artery disease) ([place]) 10/17/2014  . Hyperkalemia 10/10/2014  . Phimosis 10/05/2014  . Leg pain, bilateral 10/05/2014  . Cough 06/26/2014  . Rash and nonspecific skin eruption 01/30/2014  . Calf pain 01/30/2014  . Somnolence 11/12/2013  . Acute neck pain 10/26/2013  . RUQ abdominal pain 04/26/2013  . Neck pain, bilateral posterior 12/03/2012  . Left inguinal hernia 02/03/2012  . Abdominal pain, other specified site  02/03/2012  . Constipation 02/03/2012  . Right shoulder pain 11/20/2010  . BPH (benign prostatic hyperplasia) 09/09/2010  . CAD (coronary artery disease)   . History of colonic diverticulitis 09/04/2010  . Preventative health care 09/04/2010  . Anemia 09/04/2010  . DDD (degenerative disc disease), cervical 09/04/2010  . Diastolic dysfunction 00/93/8182  . Fatigue 09/04/2010  . Psoriasis 09/04/2010  . Arthritis   . Glaucoma   . Hyperlipidemia   . Hypertension     Past Surgical History:  Procedure Laterality Date  . broken legs  1981 and 1995  . PERIPHERAL VASCULAR CATHETERIZATION N/A 10/25/2014   Procedure: Abdominal Aortogram;  Surgeon: Wellington Hampshire, MD;  Location: [facility];  Service: Cardiovascular;  Laterality: N/A;  . PERIPHERAL VASCULAR CATHETERIZATION Bilateral 10/25/2014   Procedure: Lower Extremity Angiography;  Surgeon: Wellington Hampshire, MD;  Location: [facility];  Service: Cardiovascular;  Laterality: Bilateral;  . s/p lipoma  2003  . TONSILLECTOMY  1952        Home Medications    Prior to Admission medications   Medication Sig Start Date End Date Taking? Authorizing Provider  acetaminophen-codeine (TYLENOL #3) 300-30 MG per tablet Take 1 tablet by mouth every 6 (six) hours as needed for moderate pain. 01/24/14   [name], MD  aspirin EC 81 MG tablet Take 81 mg by mouth daily.    [provider]  atorvastatin (LIPITOR) 20 MG tablet TAKE 1 TABLET (20 MG  TOTAL) BY MOUTH DAILY. 01/21/17   [name], MD  bromocriptine (PARLODEL) 2.5 MG tablet TAKE 1/2 TABLET AT BEDTIME 12/27/16   Renato Shin, MD  ciclopirox (LOPROX) 0.77 % cream Apply 1 application topically daily as needed.    [provider]  cilostazol (PLETAL) 100 MG tablet TAKE 1 TABLET BY MOUTH TWICE A DAY 10/15/16   Wellington Hampshire, MD  clobetasol (TEMOVATE) 0.05 % external solution Apply topically 2 (two) times daily. 10/05/14   [name], MD  fosinopril  (MONOPRIL) 20 MG tablet TAKE 1 TABLET (20 MG TOTAL) BY MOUTH DAILY. 12/18/16   [name], MD  glucose blood [facility] VERIO) test strip 1 each by Other route daily. And lancets 1/day 250.00 10/20/16   Renato Shin, MD  isosorbide mononitrate (IMDUR) 60 MG 24 hr tablet Take 0.5 tablets (30 mg total) by mouth daily. Must keep scheduled appt for future refills 08/25/17   [name], MD  metFORMIN (GLUCOPHAGE-XR) 500 MG 24 hr tablet Take 2 tablets (1,000 mg total) by mouth 2 (two) times daily. 06/12/17   Renato Shin, MD  metoprolol succinate (TOPROL-XL) 50 MG 24 hr tablet TAKE 1 TABLET BY MOUTH DAILY WITH OR IMMEDIATELY FOLLOWING A MEAL 06/23/17   Wellington Hampshire, MD  repaglinide (PRANDIN) 1 MG tablet Take 1 tablet (1 mg total) by mouth 3 (three) times daily before meals. 06/12/17   Renato Shin, MD  tiZANidine (ZANAFLEX) 2 MG tablet Take 1 tablet (2 mg total) by mouth every 6 (six) hours as needed for muscle spasms. 08/27/17   [name], MD  TRAVATAN Z 0.004 % SOLN ophthalmic solution Place 1 drop into both eyes at bedtime. 06/29/15   [provider]    Family History Family History  Problem Relation Age of Onset  . Arthritis Mother   . Heart disease Mother   . Hypertension Mother   . Arthritis Father   . Heart disease Father   . Hypertension Father   . Cancer Other        lung cancer    Social History Social History   Tobacco Use  . Smoking status: Never Smoker  . Smokeless tobacco: Never Used  Substance Use Topics  . Alcohol use: No  . Drug use: No     Allergies   Levaquin [levofloxacin in d5w] and Tramadol   Review of Systems Review of Systems  Neurological: Positive for dizziness, speech difficulty and weakness.  All other systems reviewed and are negative.    Physical Exam Updated Vital Signs BP (!) 115/57   Pulse 68   Resp 10   SpO2 99%   Physical Exam  Constitutional:  Chronically ill   HENT:  Head: Normocephalic.  Mouth/Throat: Oropharynx  is clear and moist.  Eyes: Pupils are equal, round, and reactive to light. Conjunctivae and EOM are normal.  Neck: Normal range of motion. Neck supple.  Cardiovascular: Normal rate, regular rhythm and normal heart sounds.  Pulmonary/Chest: Effort normal and breath sounds normal. No stridor. No respiratory distress. He has no wheezes.  Abdominal: Soft. Bowel sounds are normal. He exhibits no distension. There is no tenderness.  Musculoskeletal: Normal range of motion.  Neurological: He is alert.  ? L facial droop. Strength 4/5 throughout. Nl finger to nose bilaterally   Skin: Skin is warm.  Psychiatric: He has a normal mood and affect.  Nursing note and vitals reviewed.    ED Treatments / Results  Labs (all labs ordered are  listed, but only abnormal results are displayed) Labs Reviewed  CBC - Abnormal; Notable for the following components:      Result Value   RBC 4.01 (*)    Hemoglobin 10.8 (*)    HCT 35.1 (*)    All other components within normal limits  DIFFERENTIAL - Abnormal; Notable for the following components:   Neutro Abs 8.7 (*)    All other components within normal limits  I-STAT CHEM 8, ED - Abnormal; Notable for the following components:   Glucose, Bld 218 (*)    Hemoglobin 10.9 (*)    HCT 32.0 (*)    All other components within normal limits  COMPREHENSIVE METABOLIC PANEL  LIPASE, BLOOD  URINALYSIS, ROUTINE W REFLEX MICROSCOPIC  ETHANOL  PROTIME-INR  APTT  RAPID URINE DRUG SCREEN, HOSP PERFORMED  I-STAT TROPONIN, ED  I-STAT TROPONIN, ED    EKG EKG Interpretation  Date/Time:  Tuesday October 20 2017 11:27:55 EDT Ventricular Rate:  67 PR Interval:    QRS Duration: 118 QT Interval:  426 QTC Calculation: 450 R Axis:   10 Text Interpretation:  Sinus rhythm Atrial premature complex LVH with IVCD and secondary repol abnrm Inferior infarct, old No significant change since last tracing Confirmed by Wandra Arthurs [phone]) on 10/20/2017 11:40:43 AM   Radiology No  results found.  Procedures Procedures (including critical care time)  Medications Ordered in ED Medications  sodium chloride 0.9 % bolus 500 mL (500 mLs Intravenous New Bag/Given 10/20/17 1252)  ondansetron (ZOFRAN) injection 4 mg (has no administration in time range)     Initial Impression / Assessment and Plan / ED Course  I have reviewed the triage vital signs and the nursing notes.  Pertinent labs & imaging results that were available during my care of the patient were reviewed by me and considered in my medical decision making (see chart for details).     JESS TONEY is a 84 y.o. male here with AMS, weakness. Consider TIA vs stroke vs near syncope. Given that he had some slurred speech and ? L facial droop currently, I activated code stroke LVO.   12:10 pm Dr. Rory Percy at bedside and since patient is back to baseline, canceled code stroke LVO. Still recommend CTA head/neck, MRI brain and admission for TIA vs near syncope.   3:09 PM CTA showed no obvious LVO. Labs and EKG unremarkable. MRI brain pending. Hospitalist to admit for near syncope vs TIA.      Final Clinical Impressions(s) / ED Diagnoses   Final diagnoses:  None    ED Discharge Orders    None       Drenda Freeze, MD 10/20/17 1510

## 2017-10-20 NOTE — H&P (Signed)
History and Physical    Jeffrey Kaiser XBL:[id number] DOB: Aug 06, 1925 DOA: 10/20/2017  PCP: [name], MD  Patient coming from: Home  Chief Complaint: Dizziness, near-syncope   HPI: Jeffrey Kaiser is a 84 y.o. male with medical history significant of CAD, DM, HLD, HTN who presents after a "non-responsive" episode today. According to patient, he went to the bathroom, had a small BM, felt very nauseous, dizzy, and had gagging episodes. He then went into the den in his recliner. He sat and his family members report that he was awake, but had his eyes closed and did not respond to their questions or interact. The entire episode lasted about 15 minutes. Wife also reports that during this episode, she noticed a left facial droop. He has a chronic left "lazy eye lid." In the ED, his main complaint is hunger. He has no other physical complaints and is feeling well. His family members at bedside report that his mentation is at baseline.    ED Course: Code stroke was called.  Neurology evaluated patient, patient underwent CTA head and neck as well as MRI brain.  They recommended admission for TIA versus near syncope.  Review of Systems: As per HPI otherwise 10 point review of systems negative.   Past Medical History:  Diagnosis Date  . Anemia 09/04/2010  . Arthritis   . BPH (benign prostatic hyperplasia) 09/09/2010  . CAD (coronary artery disease)   . DDD (degenerative disc disease), cervical 09/04/2010  . Diabetes mellitus   . Diastolic dysfunction 3/00/7622  . Diverticulitis   . Glaucoma   . Hernia   . History of colonic diverticulitis 09/04/2010  . Hyperlipidemia   . Hypertension   . Psoriasis 09/04/2010    Past Surgical History:  Procedure Laterality Date  . broken legs  1981 and 1995  . PERIPHERAL VASCULAR CATHETERIZATION N/A 10/25/2014   Procedure: Abdominal Aortogram;  Surgeon: Wellington Hampshire, MD;  Location: [facility];  Service: Cardiovascular;  Laterality: N/A;  .  PERIPHERAL VASCULAR CATHETERIZATION Bilateral 10/25/2014   Procedure: Lower Extremity Angiography;  Surgeon: Wellington Hampshire, MD;  Location: [facility];  Service: Cardiovascular;  Laterality: Bilateral;  . s/p lipoma  2003  . TONSILLECTOMY  1952     reports that he has never smoked. He has never used smokeless tobacco. He reports that he does not drink alcohol or use drugs.  Allergies  Allergen Reactions  . Levaquin [Levofloxacin In D5w] Nausea Only  . Tramadol Other (See Comments)    somnolence    Family History  Problem Relation Age of Onset  . Arthritis Mother   . Heart disease Mother   . Hypertension Mother   . Arthritis Father   . Heart disease Father   . Hypertension Father   . Cancer Other        lung cancer    Prior to Admission medications   Medication Sig Start Date End Date Taking? Authorizing Provider  acetaminophen-codeine (TYLENOL #3) 300-30 MG per tablet Take 1 tablet by mouth every 6 (six) hours as needed for moderate pain. 01/24/14   [name], MD  aspirin EC 81 MG tablet Take 81 mg by mouth daily.    [provider]  atorvastatin (LIPITOR) 20 MG tablet TAKE 1 TABLET (20 MG TOTAL) BY MOUTH DAILY. 01/21/17   [name], MD  bromocriptine (PARLODEL) 2.5 MG tablet TAKE 1/2 TABLET AT BEDTIME 12/27/16   Renato Shin, MD  ciclopirox (LOPROX) 0.77 %  cream Apply 1 application topically daily as needed.    [provider]  cilostazol (PLETAL) 100 MG tablet TAKE 1 TABLET BY MOUTH TWICE A DAY 10/15/16   Wellington Hampshire, MD  clobetasol (TEMOVATE) 0.05 % external solution Apply topically 2 (two) times daily. 10/05/14   [name], MD  fosinopril (MONOPRIL) 20 MG tablet TAKE 1 TABLET (20 MG TOTAL) BY MOUTH DAILY. 12/18/16   [name], MD  glucose blood [facility] VERIO) test strip 1 each by Other route daily. And lancets 1/day 250.00 10/20/16   Renato Shin, MD  isosorbide mononitrate (IMDUR) 60 MG 24 hr tablet Take 0.5 tablets (30 mg  total) by mouth daily. Must keep scheduled appt for future refills 08/25/17   [name], MD  metFORMIN (GLUCOPHAGE-XR) 500 MG 24 hr tablet Take 2 tablets (1,000 mg total) by mouth 2 (two) times daily. 06/12/17   Renato Shin, MD  metoprolol succinate (TOPROL-XL) 50 MG 24 hr tablet TAKE 1 TABLET BY MOUTH DAILY WITH OR IMMEDIATELY FOLLOWING A MEAL 06/23/17   Wellington Hampshire, MD  repaglinide (PRANDIN) 1 MG tablet Take 1 tablet (1 mg total) by mouth 3 (three) times daily before meals. 06/12/17   Renato Shin, MD  tiZANidine (ZANAFLEX) 2 MG tablet Take 1 tablet (2 mg total) by mouth every 6 (six) hours as needed for muscle spasms. 08/27/17   [name], MD  TRAVATAN Z 0.004 % SOLN ophthalmic solution Place 1 drop into both eyes at bedtime. 06/29/15   [provider]    Physical Exam: Vitals:   10/20/17 1330 10/20/17 1345 10/20/17 1400 10/20/17 1415  BP: (!) 128/55 (!) 107/53 (!) 107/53 (!) 117/53  Pulse: 60 62 66 70  Resp: 16 15 18 13   SpO2: 95% 94% 98% 97%    Constitutional: NAD, calm, comfortable Eyes: PERRL, lids and conjunctivae normal, left eyelid droop which per patient is chronic in nature  ENMT: Mucous membranes are moist. Posterior pharynx clear of any exudate or lesions.Normal dentition.  Neck: normal, supple, no masses, no thyromegaly Respiratory: clear to auscultation bilaterally, no wheezing, no crackles. Normal respiratory effort. No accessory muscle use.  Cardiovascular: Regular rate and rhythm, no murmurs / rubs / gallops. No extremity edema. Abdomen: no tenderness, no masses palpated. No hepatosplenomegaly. Bowel sounds positive.  Musculoskeletal: no clubbing / cyanosis. No joint deformity upper and lower extremities. Good ROM, no contractures. Normal muscle tone.  Skin: no rashes, lesions, ulcers. No induration Neurologic: CN 2-12 grossly intact. Speech clear. Nonfocal. Strength 5/5 in all 4.  Psychiatric: Normal judgment and insight. Alert and oriented x 3. Normal  mood.   Labs on Admission: I have personally reviewed following labs and imaging studies  CBC: Recent Labs  Lab 10/20/17 1209 10/20/17 1247  WBC 10.0  --   NEUTROABS 8.7*  --   HGB 10.8* 10.9*  HCT 35.1* 32.0*  MCV 87.5  --   PLT 197  --    Basic Metabolic Panel: Recent Labs  Lab 10/20/17 1239 10/20/17 1247  NA 139 139  K 4.5 4.4  CL 102 101  CO2 25  --   GLUCOSE 219* 218*  BUN 19 22  CREATININE 1.12 1.00  CALCIUM 9.2  --    GFR: CrCl cannot be calculated (Unknown ideal weight.). Liver Function Tests: Recent Labs  Lab 10/20/17 1239  AST 18  ALT 12  ALKPHOS 50  BILITOT 0.8  PROT 6.6  ALBUMIN 3.4*   Recent Labs  Lab  10/20/17 1239  LIPASE 33   No results for input(s): AMMONIA in the last 168 hours. Coagulation Profile: Recent Labs  Lab 10/20/17 1209  INR 1.08   Cardiac Enzymes: No results for input(s): CKTOTAL, CKMB, CKMBINDEX, TROPONINI in the last 168 hours. BNP (last 3 results) No results for input(s): PROBNP in the last 8760 hours. HbA1C: No results for input(s): HGBA1C in the last 72 hours. CBG: No results for input(s): GLUCAP in the last 168 hours. Lipid Profile: No results for input(s): CHOL, HDL, LDLCALC, TRIG, CHOLHDL, LDLDIRECT in the last 72 hours. Thyroid Function Tests: No results for input(s): TSH, T4TOTAL, FREET4, T3FREE, THYROIDAB in the last 72 hours. Anemia Panel: No results for input(s): VITAMINB12, FOLATE, FERRITIN, TIBC, IRON, RETICCTPCT in the last 72 hours. Urine analysis:    Component Value Date/Time   COLORURINE YELLOW 08/27/2017 [place] 08/27/2017 1541   LABSPEC >=1.030 (A) 08/27/2017 1541   PHURINE 5.5 08/27/2017 1541   GLUCOSEU NEGATIVE 08/27/2017 1541   HGBUR NEGATIVE 08/27/2017 1541   BILIRUBINUR SMALL (A) 08/27/2017 1541   KETONESUR 15 (A) 08/27/2017 1541   PROTEINUR 100 (A) 08/01/2009 0126   UROBILINOGEN 0.2 08/27/2017 1541   NITRITE NEGATIVE 08/27/2017 1541   LEUKOCYTESUR NEGATIVE  08/27/2017 1541   Sepsis Labs: !!!!!!!!!!!!!!!!!!!!!!!!!!!!!!!!!!!!!!!!!!!! @LABRCNTIP (procalcitonin:4,lacticidven:4) )No results found for this or any previous visit (from the past 240 hour(s)).   Radiological Exams on Admission: Ct Angio Head W Or Wo Contrast  Result Date: 10/20/2017 CLINICAL DATA:  Acute presentation with generalized weakness. Unresponsive. EXAM: CT ANGIOGRAPHY HEAD AND NECK TECHNIQUE: Multidetector CT imaging of the head and neck was performed using the standard protocol during bolus administration of intravenous contrast. Multiplanar CT image reconstructions and MIPs were obtained to evaluate the vascular anatomy. Carotid stenosis measurements (when applicable) are obtained utilizing NASCET criteria, using the distal internal carotid diameter as the denominator. CONTRAST:  45mL ISOVUE-370 IOPAMIDOL (ISOVUE-370) INJECTION 76% COMPARISON:  03/08/2010 FINDINGS: CT HEAD FINDINGS Brain: Age related atrophy. Mild chronic small-vessel change of the hemispheric white matter. No sign of acute infarction, mass lesion, hemorrhage, hydrocephalus or extra-axial collection. Vascular: There is atherosclerotic calcification of the major vessels at the base of the brain. Skull: Negative Sinuses: Clear Orbits: Normal Review of the MIP images confirms the above findings CTA NECK FINDINGS Aortic arch: Aortic atherosclerosis. No aneurysm or dissection. Branching pattern of the brachiocephalic vessels is normal without origin stenosis. Right carotid system: Common carotid artery is widely patent to the bifurcation. There is advanced plaque at the bifurcation and proximal ICA. Lumen is as narrow as 1 mm. Compared to a more distal cervical ICA diameter of 5 mm, this indicates an 80% or greater stenosis. Cervical ICA is patent beyond the bulb. Left carotid system: Common carotid artery is tortuous but widely patent to the bifurcation. There is atherosclerotic calcification in the ICA bulb with luminal diameter  as narrow as 1.5 mm. Compared to a more distal cervical ICA diameter of 5 mm, this indicates a 70% stenosis. Cervical ICA is widely patent beyond the bulb. Vertebral arteries: Both subclavian arteries show atherosclerotic change proximal to the vertebral origins but no flow limiting stenosis. There is atherosclerotic plaque at both vertebral artery origins. Stenosis is estimated at approximately 50% on each side. Both vertebral arteries are patent through the cervical region but show areas of atherosclerotic plaque. No flow limiting vertebral stenosis beyond the origins. Skeleton: Ordinary mild cervical spondylosis. Other neck: Question slight asymmetric prominence in the right posterior nasopharynx. Cannot rule  out nasopharyngeal mass in the early stages. Consider direct inspection. No other neck finding. Upper chest: Mild scarring.  No active process. Review of the MIP images confirms the above findings CTA HEAD FINDINGS Anterior circulation: Atherosclerotic calcification in both carotid siphon regions with stenosis estimated at 50%. Beyond that, no anterior or middle cerebral stenosis is seen on the left. On the right, the middle cerebral artery appears widely patent. There is stenosis of the A1 segment. Note that there is a patent anterior communicating artery, which would be protective. No missing distal vessels are identified. Posterior circulation: Both vertebral arteries are patent at and through the foramen magnum. Both vertebral arteries reach the basilar. No basilar stenosis. Posterior circulation branch vessels show flow. Venous sinuses: Patent and normal. Anatomic variants: None significant. Delayed phase: No abnormal enhancement. Review of the MIP images confirms the above findings IMPRESSION: No acute brain finding by CT. Advanced atherosclerotic disease at both carotid bifurcations. On the right, the luminal diameter is 1 mm or less in the ICA bulb region, consistent with 80% or greater stenosis. On  the left, atherosclerotic disease at the distal bulb results in narrowing of the lumen to 1.5 mm in diameter, consistent with a 70% stenosis. Atherosclerotic change in both carotid siphon regions with estimated 50% stenosis in the siphon regions. Moderate stenosis of the proximal anterior cerebral artery on the right. No other focal large or medium vessel narrowing beyond the siphons. Atherosclerotic change at both vertebral artery origins. Estimated 50% stenosis at each vertebral artery origin, but sufficient patency beyond that. Electronically Signed   By: Nelson Chimes M.D.   On: 10/20/2017 14:44   Dg Chest 2 View  Result Date: 10/20/2017 CLINICAL DATA:  Syncope EXAM: CHEST - 2 VIEW COMPARISON:  February 03, 2012 FINDINGS: No edema or consolidation. Heart is mildly enlarged with pulmonary vascularity normal. There is aortic atherosclerosis. No adenopathy. Bones are osteoporotic. IMPRESSION: No edema or consolidation. Mild cardiac enlargement. Bones osteoporotic. There is aortic atherosclerosis. Aortic Atherosclerosis (ICD10-I70.0). Electronically Signed   By: Lowella Grip III M.D.   On: 10/20/2017 13:28   Ct Angio Neck W Or Wo Contrast  Result Date: 10/20/2017 CLINICAL DATA:  Acute presentation with generalized weakness. Unresponsive. EXAM: CT ANGIOGRAPHY HEAD AND NECK TECHNIQUE: Multidetector CT imaging of the head and neck was performed using the standard protocol during bolus administration of intravenous contrast. Multiplanar CT image reconstructions and MIPs were obtained to evaluate the vascular anatomy. Carotid stenosis measurements (when applicable) are obtained utilizing NASCET criteria, using the distal internal carotid diameter as the denominator. CONTRAST:  17mL ISOVUE-370 IOPAMIDOL (ISOVUE-370) INJECTION 76% COMPARISON:  03/08/2010 FINDINGS: CT HEAD FINDINGS Brain: Age related atrophy. Mild chronic small-vessel change of the hemispheric white matter. No sign of acute infarction, mass  lesion, hemorrhage, hydrocephalus or extra-axial collection. Vascular: There is atherosclerotic calcification of the major vessels at the base of the brain. Skull: Negative Sinuses: Clear Orbits: Normal Review of the MIP images confirms the above findings CTA NECK FINDINGS Aortic arch: Aortic atherosclerosis. No aneurysm or dissection. Branching pattern of the brachiocephalic vessels is normal without origin stenosis. Right carotid system: Common carotid artery is widely patent to the bifurcation. There is advanced plaque at the bifurcation and proximal ICA. Lumen is as narrow as 1 mm. Compared to a more distal cervical ICA diameter of 5 mm, this indicates an 80% or greater stenosis. Cervical ICA is patent beyond the bulb. Left carotid system: Common carotid artery is tortuous but widely patent to the  bifurcation. There is atherosclerotic calcification in the ICA bulb with luminal diameter as narrow as 1.5 mm. Compared to a more distal cervical ICA diameter of 5 mm, this indicates a 70% stenosis. Cervical ICA is widely patent beyond the bulb. Vertebral arteries: Both subclavian arteries show atherosclerotic change proximal to the vertebral origins but no flow limiting stenosis. There is atherosclerotic plaque at both vertebral artery origins. Stenosis is estimated at approximately 50% on each side. Both vertebral arteries are patent through the cervical region but show areas of atherosclerotic plaque. No flow limiting vertebral stenosis beyond the origins. Skeleton: Ordinary mild cervical spondylosis. Other neck: Question slight asymmetric prominence in the right posterior nasopharynx. Cannot rule out nasopharyngeal mass in the early stages. Consider direct inspection. No other neck finding. Upper chest: Mild scarring.  No active process. Review of the MIP images confirms the above findings CTA HEAD FINDINGS Anterior circulation: Atherosclerotic calcification in both carotid siphon regions with stenosis estimated at  50%. Beyond that, no anterior or middle cerebral stenosis is seen on the left. On the right, the middle cerebral artery appears widely patent. There is stenosis of the A1 segment. Note that there is a patent anterior communicating artery, which would be protective. No missing distal vessels are identified. Posterior circulation: Both vertebral arteries are patent at and through the foramen magnum. Both vertebral arteries reach the basilar. No basilar stenosis. Posterior circulation branch vessels show flow. Venous sinuses: Patent and normal. Anatomic variants: None significant. Delayed phase: No abnormal enhancement. Review of the MIP images confirms the above findings IMPRESSION: No acute brain finding by CT. Advanced atherosclerotic disease at both carotid bifurcations. On the right, the luminal diameter is 1 mm or less in the ICA bulb region, consistent with 80% or greater stenosis. On the left, atherosclerotic disease at the distal bulb results in narrowing of the lumen to 1.5 mm in diameter, consistent with a 70% stenosis. Atherosclerotic change in both carotid siphon regions with estimated 50% stenosis in the siphon regions. Moderate stenosis of the proximal anterior cerebral artery on the right. No other focal large or medium vessel narrowing beyond the siphons. Atherosclerotic change at both vertebral artery origins. Estimated 50% stenosis at each vertebral artery origin, but sufficient patency beyond that. Electronically Signed   By: Nelson Chimes M.D.   On: 10/20/2017 14:44    EKG: Independently reviewed. Normal sinus with PAC, anterolateral T wave inversion which can also be seen in EKG from 07/21/17. No significant change noted.   Assessment/Plan Principal Problem:   TIA (transient ischemic attack) Active Problems:   Hyperlipidemia   Hypertension   CAD (coronary artery disease)   Diabetes ([place])   TIA vs near sycope  -Neurology consulted -CTA head and neck: advanced atherosclerotic disease  both carotid bifurcations. Right >80% stenosis, left 70% stenosis  -MRI brain with no acute findings -Echo -Ha1c, lipid panel -PT OT SLP  -Neurochecks -Telemetry  -Aspirin, lipitor  PAD -Continue pletal  CAD -Continue imdur  DM -SSI  HTN -Continue toprol, fosinopril    DVT prophylaxis: Lovenox  Code Status: Full  Family Communication: Wife and daughter at bedside Disposition Plan: Pending work up  [employer] called: Neurology  Admission status: Observation    Severity of Illness: The appropriate patient status for this patient is OBSERVATION. Observation status is judged to be reasonable and necessary in order to provide the required intensity of service to ensure the patient's safety. The patient's presenting symptoms, physical exam findings, and initial radiographic and laboratory data in  the context of their medical condition is felt to place them at decreased risk for further clinical deterioration. Furthermore, it is anticipated that the patient will be medically stable for discharge from the hospital within 2 midnights of admission.    Dessa Phi, DO [facility] [email or web address] Password [email or web address] 10/20/2017, 3:07 PM

## 2017-10-20 NOTE — ED Notes (Signed)
Per Neurologist, Code stroke cancelled at this time

## 2017-10-20 NOTE — Telephone Encounter (Signed)
Patients daughter called to let MD him know that patient is in the hospital she came to pick him up for appointment today and he was very lethargic and pale so she called for ambulance and he has been admitted-Just an [place]

## 2017-10-20 NOTE — Consult Note (Addendum)
Neurology consultation  Requesting Physician: Dr. Darl Householder    Chief Complaint: code stroke  History obtained from: Family members  HPI:                                                                                                                                         Jeffrey Kaiser is an 84 y.o. male with history of hypertension, coronary artery disease, hyperlipidemia, diabetes, diastolic dysfunction.  Apparently today he was sitting in his lounge recliner and was trying to retch and coughing significantly try to get some phlegm out of his throat when he suddenly was noted by his family members to close his eyes and become unresponsive.  Initially the timeframe was 15 minutes but after discussing in length with the family they realize that it was probably only 3 minutes or possibly 2 minutes and then he came around.  After coming around he recognized family members and slowly was able to talk normally.  He also noted that during this episode he became slightly pale, diaphoretic, eyes were closed and there was no abnormal tremor/jerking and/or defecation or incontinence. Does not have a history of seizures in the past. Does not have a history of stroke in the past. On detailed history taking again with the family, they reported that the might have noticed a left facial droop that has since resolved as well.  Date last known well: Date: 10/20/2017 Time last known well: Time: 09:00 tPA Given: No: Symptoms resolved NIH stroke scale of 0 Modified Rankin: Rankin Score=0  Past Medical History:  Diagnosis Date  . Anemia 09/04/2010  . Arthritis   . BPH (benign prostatic hyperplasia) 09/09/2010  . CAD (coronary artery disease)   . DDD (degenerative disc disease), cervical 09/04/2010  . Diabetes mellitus   . Diastolic dysfunction 3/84/6659  . Diverticulitis   . Glaucoma   . Hernia   . History of colonic diverticulitis 09/04/2010  . Hyperlipidemia   . Hypertension   . Psoriasis 09/04/2010    Past  Surgical History:  Procedure Laterality Date  . broken legs  1981 and 1995  . PERIPHERAL VASCULAR CATHETERIZATION N/A 10/25/2014   Procedure: Abdominal Aortogram;  Surgeon: Wellington Hampshire, MD;  Location: [facility];  Service: Cardiovascular;  Laterality: N/A;  . PERIPHERAL VASCULAR CATHETERIZATION Bilateral 10/25/2014   Procedure: Lower Extremity Angiography;  Surgeon: Wellington Hampshire, MD;  Location: [facility];  Service: Cardiovascular;  Laterality: Bilateral;  . s/p lipoma  2003  . TONSILLECTOMY  1952    Family History  Problem Relation Age of Onset  . Arthritis Mother   . Heart disease Mother   . Hypertension Mother   . Arthritis Father   . Heart disease Father   . Hypertension Father   . Cancer Other        lung cancer        Social History:  reports that he has never smoked. He has never used smokeless tobacco. He reports that he does not drink alcohol or use drugs.  Allergies:  Allergies  Allergen Reactions  . Levaquin [Levofloxacin In D5w] Nausea Only  . Tramadol Other (See Comments)    somnolence    Medications:                                                                                                                           Current Facility-Administered Medications  Medication Dose Route Frequency Provider Last Rate Last Dose  . ondansetron (ZOFRAN) injection 4 mg  4 mg Intravenous Once Drenda Freeze, MD      . sodium chloride 0.9 % bolus 500 mL  500 mL Intravenous Once Drenda Freeze, MD       Current Outpatient Medications  Medication Sig Dispense Refill  . acetaminophen-codeine (TYLENOL #3) 300-30 MG per tablet Take 1 tablet by mouth every 6 (six) hours as needed for moderate pain. 60 tablet 0  . aspirin EC 81 MG tablet Take 81 mg by mouth daily.    Marland Kitchen atorvastatin (LIPITOR) 20 MG tablet TAKE 1 TABLET (20 MG TOTAL) BY MOUTH DAILY. 90 tablet 2  . bromocriptine (PARLODEL) 2.5 MG tablet TAKE 1/2 TABLET AT BEDTIME 45 tablet 0  .  ciclopirox (LOPROX) 0.77 % cream Apply 1 application topically daily as needed.    . cilostazol (PLETAL) 100 MG tablet TAKE 1 TABLET BY MOUTH TWICE A DAY 180 tablet 1  . clobetasol (TEMOVATE) 0.05 % external solution Apply topically 2 (two) times daily. 50 mL 1  . fosinopril (MONOPRIL) 20 MG tablet TAKE 1 TABLET (20 MG TOTAL) BY MOUTH DAILY. 90 tablet 2  . glucose blood (ONETOUCH VERIO) test strip 1 each by Other route daily. And lancets 1/day 250.00 100 each 12  . isosorbide mononitrate (IMDUR) 60 MG 24 hr tablet Take 0.5 tablets (30 mg total) by mouth daily. Must keep scheduled appt for future refills 15 tablet 0  . metFORMIN (GLUCOPHAGE-XR) 500 MG 24 hr tablet Take 2 tablets (1,000 mg total) by mouth 2 (two) times daily. 120 tablet 11  . metoprolol succinate (TOPROL-XL) 50 MG 24 hr tablet TAKE 1 TABLET BY MOUTH DAILY WITH OR IMMEDIATELY FOLLOWING A MEAL 90 tablet 2  . repaglinide (PRANDIN) 1 MG tablet Take 1 tablet (1 mg total) by mouth 3 (three) times daily before meals. 90 tablet 11  . tiZANidine (ZANAFLEX) 2 MG tablet Take 1 tablet (2 mg total) by mouth every 6 (six) hours as needed for muscle spasms. 60 tablet 1  . TRAVATAN Z 0.004 % SOLN ophthalmic solution Place 1 drop into both eyes at bedtime.  4    ROS:  History obtained from Family  General ROS: negative for - chills, fatigue, fever, night sweats, weight gain or weight loss Psychological ROS: negative for - , hallucinations, memory difficulties, mood swings or  Ophthalmic ROS: negative for - blurry vision, double vision, eye pain or loss of vision ENT ROS: negative for - epistaxis, nasal discharge, oral lesions, sore throat, tinnitus or vertigo Respiratory ROS: negative for - cough,  shortness of breath or wheezing Cardiovascular ROS: negative for - chest pain, dyspnea on exertion,  Gastrointestinal  ROS: negative for - abdominal pain, diarrhea,  nausea/vomiting or stool incontinence Genito-Urinary ROS: negative for - dysuria, hematuria, incontinence or urinary frequency/urgency Musculoskeletal ROS: negative for - joint swelling or muscular weakness Neurological ROS: as noted in HPI   General Examination:                                                                                                      Blood pressure (!) 124/59, pulse 80, resp. rate 19, SpO2 94 %.  HEENT-  Normocephalic, no lesions, without obvious abnormality.  Normal external eye and conjunctiva.   Extremities- Warm, dry and intact Musculoskeletal-no joint tenderness, deformity or swelling Skin-warm and dry, no hyperpigmentation, vitiligo, or suspicious lesions  Neurological Examination Mental Status: Alert, oriented, thought content appropriate.  Speech fluent without evidence of aphasia.  Able to follow 3 step commands without difficulty. Cranial Nerves: II:  Visual fields grossly normal,  III,IV, VI: ptosis present left eye, extra-ocular motions intact bilaterally, pupils equal, round, reactive to light and accommodation V,VII: smile symmetric, facial light touch sensation normal bilaterally VIII: hearing normal bilaterally IX,X: uvula rises symmetrically XI: bilateral shoulder shrug XII: midline tongue extension Motor: Right : Upper extremity   5/5    Left:     Upper extremity   5/5  Lower extremity   5/5     Lower extremity   5/5 Postural tremor when hands are held outstretched Sensory: Pinprick and light touch intact throughout, bilaterally Deep Tendon Reflexes: Depressed throughout Plantars: Right: downgoing   Left: downgoing Cerebellar: normal finger-to-nose,and normal heel-to-shin test Gait: Not tested   Lab Results: Basic Metabolic Panel: Recent Labs  Lab 10/20/17 1247  NA 139  K 4.4  CL 101  GLUCOSE 218*  BUN 22  CREATININE 1.00    CBC: Recent Labs  Lab 10/20/17 1209  10/20/17 1247  WBC 10.0  --   NEUTROABS 8.7*  --   HGB 10.8* 10.9*  HCT 35.1* 32.0*  MCV 87.5  --   PLT 197  --     Imaging: No results found.  Assessment and plan discussed with with attending physician and they are in agreement.    Etta Quill PA-C [facility] [phone]  10/20/2017, 12:41 PM   Assessment: 84 y.o. male with episode that I believe would be a syncopal episode given the fact that he had a short period of loss of consciousness with quick recovery along with symptoms of diaphoresis, closed eyes, no tremor or jerking, incontinence.  Currently back to baseline.  However given his age and risk factors he would  definitely benefit from further work-up to look at his heart and cardiac/stroke risk factors in detail.  Impression Syncope vs Evaluate for TIA (due to possible left facial droop that was also reported at the time)  Recommend -Cancel acute code stroke but does need stroke/TIA risk factor work-up as below -CT of head to evaluate for any evidence of bleed. -MRI brain without contrast. -CT angiogram head and neck -Telemetry - Aspirin 325 -Atorvastatin 20 -2D echo -Hemoglobin A1c - Fasting lipid panel -Frequent neurochecks -PT OT  -- Amie Portland, MD [facility] Pager: [phone] If 7pm to 7am, please call on call as listed on [facility]ION.

## 2017-10-20 NOTE — ED Triage Notes (Signed)
Pt in from home via [facility], per report pt had an episode of n/v yesterday that has subsided, pt family called today d/t pt having generalized weakness,no slurred speech, no facial droop, ambulatory, pt A&O x4

## 2017-10-20 NOTE — ED Notes (Signed)
Patient transported to MRI 

## 2017-10-20 NOTE — ED Notes (Signed)
Patient transported to CT 

## 2017-10-21 ENCOUNTER — Observation Stay (HOSPITAL_BASED_OUTPATIENT_CLINIC_OR_DEPARTMENT_OTHER): Payer: Medicare Other

## 2017-10-21 ENCOUNTER — Other Ambulatory Visit (HOSPITAL_COMMUNITY): Payer: Medicare Other

## 2017-10-21 DIAGNOSIS — E1151 Type 2 diabetes mellitus with diabetic peripheral angiopathy without gangrene: Secondary | ICD-10-CM

## 2017-10-21 DIAGNOSIS — I5030 Unspecified diastolic (congestive) heart failure: Secondary | ICD-10-CM

## 2017-10-21 DIAGNOSIS — G459 Transient cerebral ischemic attack, unspecified: Secondary | ICD-10-CM | POA: Diagnosis not present

## 2017-10-21 DIAGNOSIS — E7849 Other hyperlipidemia: Secondary | ICD-10-CM | POA: Diagnosis not present

## 2017-10-21 DIAGNOSIS — R55 Syncope and collapse: Secondary | ICD-10-CM | POA: Diagnosis not present

## 2017-10-21 LAB — GLUCOSE, CAPILLARY
Glucose-Capillary: 160 mg/dL — ABNORMAL HIGH (ref 70–99)
Glucose-Capillary: 216 mg/dL — ABNORMAL HIGH (ref 70–99)

## 2017-10-21 LAB — LIPID PANEL
CHOL/HDL RATIO: 3.6 ratio
Cholesterol: 128 mg/dL (ref 0–200)
HDL: 36 mg/dL — ABNORMAL LOW (ref >40)
LDL CALC: 74 mg/dL (ref 0–99)
Triglycerides: 91 mg/dL (ref <150)
VLDL: 18 mg/dL (ref 0–40)

## 2017-10-21 LAB — ECHOCARDIOGRAM COMPLETE: Height: 65 in

## 2017-10-21 LAB — HEMOGLOBIN A1C
Hgb A1c MFr Bld: 8.3 % — ABNORMAL HIGH (ref 4.8–5.6)
Mean Plasma Glucose: 191.51 mg/dL

## 2017-10-21 MED ORDER — METOPROLOL SUCCINATE ER 25 MG PO TB24: 25.0000 mg | ORAL_TABLET | Freq: Every day | ORAL | Status: DC

## 2017-10-21 MED ORDER — ASPIRIN 325 MG PO TBEC: 325.0000 mg | DELAYED_RELEASE_TABLET | Freq: Every day | ORAL | 0 refills | Status: DC

## 2017-10-21 NOTE — Progress Notes (Signed)
STROKE TEAM PROGRESS NOTE   SUBJECTIVE (INTERVAL HISTORY) His wife and daughter are at the bedside.  Overall he feels his condition is completely resolved. Wife stated that pt had bout of severe cough and then he became dazed off, not LOC, but not responding to questions. When the episode resolved, he had mild left facial droop which has been resolved over time.   OBJECTIVE Temp:  [97.6 F (36.4 C)-98.3 F (36.8 C)] 97.6 F (36.4 C) (07/17 1602) Pulse Rate:  [65-89] 66 (07/17 1602) Cardiac Rhythm: Normal sinus rhythm (07/17 0700) Resp:  [10-27] 18 (07/17 1602) BP: (102-194)/(47-81) 144/61 (07/17 1602) SpO2:  [94 %-99 %] 98 % (07/17 1602)  Recent Labs  Lab 10/20/17 2007 10/21/17 0604 10/21/17 1105  GLUCAP 161* 160* 216*   Recent Labs  Lab 10/20/17 1239 10/20/17 1247  NA 139 139  K 4.5 4.4  CL 102 101  CO2 25  --   GLUCOSE 219* 218*  BUN 19 22  CREATININE 1.12 1.00  CALCIUM 9.2  --    Recent Labs  Lab 10/20/17 1239  AST 18  ALT 12  ALKPHOS 50  BILITOT 0.8  PROT 6.6  ALBUMIN 3.4*   Recent Labs  Lab 10/20/17 1209 10/20/17 1247  WBC 10.0  --   NEUTROABS 8.7*  --   HGB 10.8* 10.9*  HCT 35.1* 32.0*  MCV 87.5  --   PLT 197  --    No results for input(s): CKTOTAL, CKMB, CKMBINDEX, TROPONINI in the last 168 hours. Recent Labs    10/20/17 1209  LABPROT 13.9  INR 1.08   Recent Labs    10/20/17 1803  COLORURINE YELLOW  LABSPEC 1.045*  PHURINE 5.0  GLUCOSEU NEGATIVE  HGBUR NEGATIVE  BILIRUBINUR NEGATIVE  KETONESUR NEGATIVE  PROTEINUR NEGATIVE  NITRITE NEGATIVE  LEUKOCYTESUR NEGATIVE       Component Value Date/Time   CHOL 128 10/21/2017 0408   TRIG 91 10/21/2017 0408   HDL 36 (L) 10/21/2017 0408   CHOLHDL 3.6 10/21/2017 0408   VLDL 18 10/21/2017 0408   LDLCALC 74 10/21/2017 0408   Lab Results  Component Value Date   HGBA1C 8.3 (H) 10/21/2017      Component Value Date/Time   LABOPIA NONE DETECTED 10/20/2017 1803   COCAINSCRNUR NONE  DETECTED 10/20/2017 1803   LABBENZ NONE DETECTED 10/20/2017 1803   AMPHETMU NONE DETECTED 10/20/2017 1803   THCU NONE DETECTED 10/20/2017 1803   LABBARB (A) 10/20/2017 1803    Result not available. Reagent lot number recalled by manufacturer.    Recent Labs  Lab 10/20/17 1209  ETH <10    I have personally reviewed the radiological images below and agree with the radiology interpretations.  Ct Angio Head W Or Wo Contrast  Result Date: 10/20/2017 CLINICAL DATA:  Acute presentation with generalized weakness. Unresponsive. EXAM: CT ANGIOGRAPHY HEAD AND NECK TECHNIQUE: Multidetector CT imaging of the head and neck was performed using the standard protocol during bolus administration of intravenous contrast. Multiplanar CT image reconstructions and MIPs were obtained to evaluate the vascular anatomy. Carotid stenosis measurements (when applicable) are obtained utilizing NASCET criteria, using the distal internal carotid diameter as the denominator. CONTRAST:  86mL ISOVUE-370 IOPAMIDOL (ISOVUE-370) INJECTION 76% COMPARISON:  03/08/2010 FINDINGS: CT HEAD FINDINGS Brain: Age related atrophy. Mild chronic small-vessel change of the hemispheric white matter. No sign of acute infarction, mass lesion, hemorrhage, hydrocephalus or extra-axial collection. Vascular: There is atherosclerotic calcification of the major vessels at the base of the brain. Skull:  Negative Sinuses: Clear Orbits: Normal Review of the MIP images confirms the above findings CTA NECK FINDINGS Aortic arch: Aortic atherosclerosis. No aneurysm or dissection. Branching pattern of the brachiocephalic vessels is normal without origin stenosis. Right carotid system: Common carotid artery is widely patent to the bifurcation. There is advanced plaque at the bifurcation and proximal ICA. Lumen is as narrow as 1 mm. Compared to a more distal cervical ICA diameter of 5 mm, this indicates an 80% or greater stenosis. Cervical ICA is patent beyond the bulb.  Left carotid system: Common carotid artery is tortuous but widely patent to the bifurcation. There is atherosclerotic calcification in the ICA bulb with luminal diameter as narrow as 1.5 mm. Compared to a more distal cervical ICA diameter of 5 mm, this indicates a 70% stenosis. Cervical ICA is widely patent beyond the bulb. Vertebral arteries: Both subclavian arteries show atherosclerotic change proximal to the vertebral origins but no flow limiting stenosis. There is atherosclerotic plaque at both vertebral artery origins. Stenosis is estimated at approximately 50% on each side. Both vertebral arteries are patent through the cervical region but show areas of atherosclerotic plaque. No flow limiting vertebral stenosis beyond the origins. Skeleton: Ordinary mild cervical spondylosis. Other neck: Question slight asymmetric prominence in the right posterior nasopharynx. Cannot rule out nasopharyngeal mass in the early stages. Consider direct inspection. No other neck finding. Upper chest: Mild scarring.  No active process. Review of the MIP images confirms the above findings CTA HEAD FINDINGS Anterior circulation: Atherosclerotic calcification in both carotid siphon regions with stenosis estimated at 50%. Beyond that, no anterior or middle cerebral stenosis is seen on the left. On the right, the middle cerebral artery appears widely patent. There is stenosis of the A1 segment. Note that there is a patent anterior communicating artery, which would be protective. No missing distal vessels are identified. Posterior circulation: Both vertebral arteries are patent at and through the foramen magnum. Both vertebral arteries reach the basilar. No basilar stenosis. Posterior circulation branch vessels show flow. Venous sinuses: Patent and normal. Anatomic variants: None significant. Delayed phase: No abnormal enhancement. Review of the MIP images confirms the above findings IMPRESSION: No acute brain finding by CT. Advanced  atherosclerotic disease at both carotid bifurcations. On the right, the luminal diameter is 1 mm or less in the ICA bulb region, consistent with 80% or greater stenosis. On the left, atherosclerotic disease at the distal bulb results in narrowing of the lumen to 1.5 mm in diameter, consistent with a 70% stenosis. Atherosclerotic change in both carotid siphon regions with estimated 50% stenosis in the siphon regions. Moderate stenosis of the proximal anterior cerebral artery on the right. No other focal large or medium vessel narrowing beyond the siphons. Atherosclerotic change at both vertebral artery origins. Estimated 50% stenosis at each vertebral artery origin, but sufficient patency beyond that. Electronically Signed   By: Nelson Chimes M.D.   On: 10/20/2017 14:44   Dg Chest 2 View  Result Date: 10/20/2017 CLINICAL DATA:  Syncope EXAM: CHEST - 2 VIEW COMPARISON:  February 03, 2012 FINDINGS: No edema or consolidation. Heart is mildly enlarged with pulmonary vascularity normal. There is aortic atherosclerosis. No adenopathy. Bones are osteoporotic. IMPRESSION: No edema or consolidation. Mild cardiac enlargement. Bones osteoporotic. There is aortic atherosclerosis. Aortic Atherosclerosis (ICD10-I70.0). Electronically Signed   By: Lowella Grip III M.D.   On: 10/20/2017 13:28   Ct Angio Neck W Or Wo Contrast  Result Date: 10/20/2017 CLINICAL DATA:  Acute presentation with  generalized weakness. Unresponsive. EXAM: CT ANGIOGRAPHY HEAD AND NECK TECHNIQUE: Multidetector CT imaging of the head and neck was performed using the standard protocol during bolus administration of intravenous contrast. Multiplanar CT image reconstructions and MIPs were obtained to evaluate the vascular anatomy. Carotid stenosis measurements (when applicable) are obtained utilizing NASCET criteria, using the distal internal carotid diameter as the denominator. CONTRAST:  24mL ISOVUE-370 IOPAMIDOL (ISOVUE-370) INJECTION 76% COMPARISON:   03/08/2010 FINDINGS: CT HEAD FINDINGS Brain: Age related atrophy. Mild chronic small-vessel change of the hemispheric white matter. No sign of acute infarction, mass lesion, hemorrhage, hydrocephalus or extra-axial collection. Vascular: There is atherosclerotic calcification of the major vessels at the base of the brain. Skull: Negative Sinuses: Clear Orbits: Normal Review of the MIP images confirms the above findings CTA NECK FINDINGS Aortic arch: Aortic atherosclerosis. No aneurysm or dissection. Branching pattern of the brachiocephalic vessels is normal without origin stenosis. Right carotid system: Common carotid artery is widely patent to the bifurcation. There is advanced plaque at the bifurcation and proximal ICA. Lumen is as narrow as 1 mm. Compared to a more distal cervical ICA diameter of 5 mm, this indicates an 80% or greater stenosis. Cervical ICA is patent beyond the bulb. Left carotid system: Common carotid artery is tortuous but widely patent to the bifurcation. There is atherosclerotic calcification in the ICA bulb with luminal diameter as narrow as 1.5 mm. Compared to a more distal cervical ICA diameter of 5 mm, this indicates a 70% stenosis. Cervical ICA is widely patent beyond the bulb. Vertebral arteries: Both subclavian arteries show atherosclerotic change proximal to the vertebral origins but no flow limiting stenosis. There is atherosclerotic plaque at both vertebral artery origins. Stenosis is estimated at approximately 50% on each side. Both vertebral arteries are patent through the cervical region but show areas of atherosclerotic plaque. No flow limiting vertebral stenosis beyond the origins. Skeleton: Ordinary mild cervical spondylosis. Other neck: Question slight asymmetric prominence in the right posterior nasopharynx. Cannot rule out nasopharyngeal mass in the early stages. Consider direct inspection. No other neck finding. Upper chest: Mild scarring.  No active process. Review of the  MIP images confirms the above findings CTA HEAD FINDINGS Anterior circulation: Atherosclerotic calcification in both carotid siphon regions with stenosis estimated at 50%. Beyond that, no anterior or middle cerebral stenosis is seen on the left. On the right, the middle cerebral artery appears widely patent. There is stenosis of the A1 segment. Note that there is a patent anterior communicating artery, which would be protective. No missing distal vessels are identified. Posterior circulation: Both vertebral arteries are patent at and through the foramen magnum. Both vertebral arteries reach the basilar. No basilar stenosis. Posterior circulation branch vessels show flow. Venous sinuses: Patent and normal. Anatomic variants: None significant. Delayed phase: No abnormal enhancement. Review of the MIP images confirms the above findings IMPRESSION: No acute brain finding by CT. Advanced atherosclerotic disease at both carotid bifurcations. On the right, the luminal diameter is 1 mm or less in the ICA bulb region, consistent with 80% or greater stenosis. On the left, atherosclerotic disease at the distal bulb results in narrowing of the lumen to 1.5 mm in diameter, consistent with a 70% stenosis. Atherosclerotic change in both carotid siphon regions with estimated 50% stenosis in the siphon regions. Moderate stenosis of the proximal anterior cerebral artery on the right. No other focal large or medium vessel narrowing beyond the siphons. Atherosclerotic change at both vertebral artery origins. Estimated 50% stenosis at each vertebral  artery origin, but sufficient patency beyond that. Electronically Signed   By: Nelson Chimes M.D.   On: 10/20/2017 14:44   Mr Brain Wo Contrast  Result Date: 10/20/2017 CLINICAL DATA:  Episode of loss of consciousness EXAM: MRI HEAD WITHOUT CONTRAST TECHNIQUE: Multiplanar, multiecho pulse sequences of the brain and surrounding structures were obtained without intravenous contrast.  COMPARISON:  CT same day FINDINGS: Brain: Diffusion imaging does not show any acute or subacute infarction. There chronic small-vessel ischemic changes of the pons. There is cerebellar atrophy without focal insult. Cerebral hemispheres show generalized atrophy with mild chronic small-vessel change of the white matter. No large vessel territory infarction. No mass lesion, hemorrhage, hydrocephalus or extra-axial collection. Vascular: Major vessels at the base of the brain show flow. Skull and upper cervical spine: Negative Sinuses/Orbits: Clear/normal Other: None IMPRESSION: No acute finding. Age related atrophy. Mild chronic small-vessel ischemic change. Electronically Signed   By: Nelson Chimes M.D.   On: 10/20/2017 15:33   TTE pending   PHYSICAL EXAM  Temp:  [97.6 F (36.4 C)-98.3 F (36.8 C)] 97.6 F (36.4 C) (07/17 1602) Pulse Rate:  [65-89] 66 (07/17 1602) Resp:  [10-27] 18 (07/17 1602) BP: (102-194)/(47-81) 144/61 (07/17 1602) SpO2:  [94 %-99 %] 98 % (07/17 1602)  General - Well nourished, well developed, in no apparent distress.  Ophthalmologic - fundi not visualized due to noncooperation.  Cardiovascular - Regular rate and rhythm with no murmur.  Mental Status -  Level of arousal and orientation to time, place, and person were intact. Language including expression, naming, repetition, comprehension was assessed and found intact. Fund of Knowledge was assessed and was intact.  Cranial Nerves II - XII - II - Visual field intact OU. III, IV, VI - Extraocular movements intact. V - Facial sensation intact bilaterally. VII - Facial movement intact bilaterally. VIII - Hearing & vestibular intact bilaterally. X - Palate elevates symmetrically. XI - Chin turning & shoulder shrug intact bilaterally. XII - Tongue protrusion intact.  Motor Strength - The patient's strength was normal in all extremities and pronator drift was absent.  Bulk was normal and fasciculations were absent.    Motor Tone - Muscle tone was assessed at the neck and appendages and was normal.  Reflexes - The patient's reflexes were symmetrical in all extremities and he had no pathological reflexes.  Sensory - Light touch, temperature/pinprick were assessed and were symmetrical.    Coordination - The patient had normal movements in the hands with no ataxia or dysmetria.  Tremor was absent.  Gait and Station - deferred.   ASSESSMENT/PLAN [name] is a 84 y.o. male with history of CAD, DM, HTN, HLD admitted for presyncope episode after severe coughing followed by mild left facial droop. No tPA given due to rapidly resolving symptoms.    TIA due to cough related presyncope episode in the setting of stenosis of bilateral ICAs  Resultant back to baseline  MRI no acute stroke  CTA head and neck right ICA bulb high-grade stenosis, left ICA bulb bulb 70% stenosis, right VA origin atherosclerosis, bilateral ICA siphons high-grade stenosis  2D Echo pending  LDL 74  HgbA1c 8.3  UDS negative  Lovenox for VTE prophylaxis  aspirin 81 mg daily and platel prior to admission, now on aspirin 81 mg daily and platel.  Continue both on discharge  Patient counseled to be compliant with his antithrombotic medications  Ongoing aggressive stroke risk factor management  Therapy recommendations:  none  Disposition: Home  likely  Bilateral ICA stenosis  CTA head and neck right ICA bulb high-grade stenosis, left ICA bulb bulb 70% stenosis, right VA origin atherosclerosis, bilateral ICA siphons high-grade stenosis  No urgent intervention needed at this time  Will refer to [facility] for close follow-up  Diabetes  HgbA1c 8.3 goal < 7.0  Uncontrolled  CBG monitoring  SSI  DM education and close PCP follow up  Hypertension Stable BP goal 130-150 due to bilateral ICA stenosis Avoid low blood pressure  Avoid dehydration  Hyperlipidemia  Home meds: Lipitor 20  LDL 74, goal < 70  Now  on Lipitor 20  Continue statin at discharge  Other Stroke Risk Factors  Advanced age  Coronary artery disease  Other Active Problems  Hyperglycemia  Hospital day # 0  Neurology will sign off. Please call with questions. Pt will follow up with stroke clinic NP at [facility] in about 4 weeks. Thanks for the consult.  Rosalin Hawking, MD PhD Stroke Neurology 10/21/2017 6:43 PM    To contact Stroke Continuity provider, please refer to [email or web address] After hours, contact General Neurology

## 2017-10-21 NOTE — Progress Notes (Signed)
AVS printed and explained to patient.  Patient notified of changes to medication, medicaitons that still need to be administered at bedtime, and follow up appointments.  Iv removed and patient dressed for discharge.  Daughter and grandson at bedside and no further questions at this time.  Patient waiting to be wheeled outside.

## 2017-10-21 NOTE — Care Management Obs Status (Signed)
[place] NOTIFICATION   Patient Details  Name: Jeffrey Kaiser MRN: [id number] Date of Birth: 03-01-1926   Medicare Observation Status Notification Given:  Yes    Carles Collet, RN 10/21/2017, 2:15 PM

## 2017-10-21 NOTE — Care Management Note (Signed)
Case Management Note  Patient Details  Name: ARCH METHOT MRN: [id number] Date of Birth: Dec 09, 1925  Subjective/Objective:                 Patient in obs for TIA. From home with wife. PT evals complete no f/u recommended.    Action/Plan:   Expected Discharge Date:  10/22/17               Expected Discharge Plan:  Home/Self Care  In-House Referral:     Discharge planning Services  CM Consult  Post Acute Care Choice:    Choice offered to:     DME Arranged:    DME Agency:     HH Arranged:    HH Agency:     Status of Service:  Completed, signed off  If discussed at [employer] of Stay Meetings, dates discussed:    Additional Comments:  Carles Collet, RN 10/21/2017, 3:57 PM

## 2017-10-21 NOTE — Discharge Summary (Signed)
Physician Discharge Summary  Jeffrey Kaiser OZH:[id number] DOB: [birth year] DOA: 10/20/2017  PCP: [name], MD  Admit date: 10/20/2017 Discharge date: 10/21/2017  Admitted From: home Discharge disposition: home   Recommendations for Outpatient Follow-Up:   BP goal 130-150 due to bilateral ICA stenosis-- BP medications adjusted, please follow up Vascular surgery follow up for carotid stenosis Dietary changes for elevated blood sugars     Discharge Diagnosis:   Principal Problem:   TIA (transient ischemic attack) Active Problems:   Hyperlipidemia   Hypertension   CAD (coronary artery disease)   Diabetes ([place])    Discharge Condition: Improved.  Diet recommendation: Low sodium, heart healthy.  Carbohydrate-modified  Wound care: None.  Code status: Full.   History of Present Illness:  Jeffrey Kaiser is a 84 y.o. male with medical history significant of CAD, DM, HLD, HTN who presents after a "non-responsive" episode today. According to patient, he went to the bathroom, had a small BM, felt very nauseous, dizzy, and had gagging episodes. He then went into the den in his recliner. He sat and his family members report that he was awake, but had his eyes closed and did not respond to their questions or interact. The entire episode lasted about 15 minutes. Wife also reports that during this episode, she noticed a left facial droop. He has a chronic left "lazy eye lid." In the ED, his main complaint is hunger. He has no other physical complaints and is feeling well. His family members at bedside report that his mentation is at baseline.       Hospital Course by Problem:   TIA due to cough related presyncope episode in the setting of stenosis of bilateral ICAs  MRI no acute stroke  CTA head and neck right ICA bulb high-grade stenosis, left ICA bulb bulb 70% stenosis, right VA origin atherosclerosis, bilateral ICA siphons high-grade stenosis  2D Echo:- Left  ventricle: The cavity size was normal. There was mild   concentric hypertrophy. Systolic function was normal. The   estimated ejection fraction was in the range of 55% to 60%. Wall   motion was normal; there were no regional wall motion   abnormalities. Doppler parameters are consistent with abnormal   left ventricular relaxation (grade 1 diastolic dysfunction).   Doppler parameters are consistent with high ventricular filling pressure.  LDL 74- on statin  HgbA1c 8.3  aspirin 81 mg daily and platel prior to admission-- continue the same per neurology  Bilateral ICA stenosis  CTA head and neck right ICA bulb high-grade stenosis, left ICA bulb bulb 70% stenosis, right VA origin atherosclerosis, bilateral ICA siphons high-grade stenosis  No urgent intervention needed at this time  Vascular surgery follow up  Diabetes  HgbA1c 8.3 goal < 7.0  Uncontrolled- dietary adjustments recommended   Hypertension  Stable  BP goal 130-150 due to bilateral ICA stenosis  Avoid low blood pressure  Avoid dehydration  Hyperlipidemia  Home meds: Lipitor 20  LDL 74, goal < 70       Medical Consultants:   neurology   Discharge Exam:   Vitals:   10/21/17 1130 10/21/17 1602  BP: (!) 102/47 (!) 144/61  Pulse: 69 66  Resp: 18 18  Temp: 97.7 F (36.5 C) 97.6 F (36.4 C)  SpO2: 99% 98%   Vitals:   10/21/17 0339 10/21/17 0900 10/21/17 1130 10/21/17 1602  BP: (!) 108/47 (!) 126/55 (!) 102/47 (!) 144/61  Pulse: 67 65 69 66  Resp: 17 18 18 18   Temp: 98 F (36.7 C) 98.2 F (36.8 C) 97.7 F (36.5 C) 97.6 F (36.4 C)  TempSrc: Oral Oral Oral Oral  SpO2: 96% 98% 99% 98%  Height:        General exam: Appears calm and comfortable. NAD   The results of significant diagnostics from this hospitalization (including imaging, microbiology, ancillary and laboratory) are listed below for reference.     Procedures and Diagnostic Studies:   Ct Angio Head W Or Wo  Contrast  Result Date: 10/20/2017 CLINICAL DATA:  Acute presentation with generalized weakness. Unresponsive. EXAM: CT ANGIOGRAPHY HEAD AND NECK TECHNIQUE: Multidetector CT imaging of the head and neck was performed using the standard protocol during bolus administration of intravenous contrast. Multiplanar CT image reconstructions and MIPs were obtained to evaluate the vascular anatomy. Carotid stenosis measurements (when applicable) are obtained utilizing NASCET criteria, using the distal internal carotid diameter as the denominator. CONTRAST:  11mL ISOVUE-370 IOPAMIDOL (ISOVUE-370) INJECTION 76% COMPARISON:  03/08/2010 FINDINGS: CT HEAD FINDINGS Brain: Age related atrophy. Mild chronic small-vessel change of the hemispheric white matter. No sign of acute infarction, mass lesion, hemorrhage, hydrocephalus or extra-axial collection. Vascular: There is atherosclerotic calcification of the major vessels at the base of the brain. Skull: Negative Sinuses: Clear Orbits: Normal Review of the MIP images confirms the above findings CTA NECK FINDINGS Aortic arch: Aortic atherosclerosis. No aneurysm or dissection. Branching pattern of the brachiocephalic vessels is normal without origin stenosis. Right carotid system: Common carotid artery is widely patent to the bifurcation. There is advanced plaque at the bifurcation and proximal ICA. Lumen is as narrow as 1 mm. Compared to a more distal cervical ICA diameter of 5 mm, this indicates an 80% or greater stenosis. Cervical ICA is patent beyond the bulb. Left carotid system: Common carotid artery is tortuous but widely patent to the bifurcation. There is atherosclerotic calcification in the ICA bulb with luminal diameter as narrow as 1.5 mm. Compared to a more distal cervical ICA diameter of 5 mm, this indicates a 70% stenosis. Cervical ICA is widely patent beyond the bulb. Vertebral arteries: Both subclavian arteries show atherosclerotic change proximal to the vertebral  origins but no flow limiting stenosis. There is atherosclerotic plaque at both vertebral artery origins. Stenosis is estimated at approximately 50% on each side. Both vertebral arteries are patent through the cervical region but show areas of atherosclerotic plaque. No flow limiting vertebral stenosis beyond the origins. Skeleton: Ordinary mild cervical spondylosis. Other neck: Question slight asymmetric prominence in the right posterior nasopharynx. Cannot rule out nasopharyngeal mass in the early stages. Consider direct inspection. No other neck finding. Upper chest: Mild scarring.  No active process. Review of the MIP images confirms the above findings CTA HEAD FINDINGS Anterior circulation: Atherosclerotic calcification in both carotid siphon regions with stenosis estimated at 50%. Beyond that, no anterior or middle cerebral stenosis is seen on the left. On the right, the middle cerebral artery appears widely patent. There is stenosis of the A1 segment. Note that there is a patent anterior communicating artery, which would be protective. No missing distal vessels are identified. Posterior circulation: Both vertebral arteries are patent at and through the foramen magnum. Both vertebral arteries reach the basilar. No basilar stenosis. Posterior circulation branch vessels show flow. Venous sinuses: Patent and normal. Anatomic variants: None significant. Delayed phase: No abnormal enhancement. Review of the MIP images confirms the above findings IMPRESSION: No acute brain finding by CT. Advanced atherosclerotic disease at both  carotid bifurcations. On the right, the luminal diameter is 1 mm or less in the ICA bulb region, consistent with 80% or greater stenosis. On the left, atherosclerotic disease at the distal bulb results in narrowing of the lumen to 1.5 mm in diameter, consistent with a 70% stenosis. Atherosclerotic change in both carotid siphon regions with estimated 50% stenosis in the siphon regions. Moderate  stenosis of the proximal anterior cerebral artery on the right. No other focal large or medium vessel narrowing beyond the siphons. Atherosclerotic change at both vertebral artery origins. Estimated 50% stenosis at each vertebral artery origin, but sufficient patency beyond that. Electronically Signed   By: Nelson Chimes M.D.   On: 10/20/2017 14:44   Dg Chest 2 View  Result Date: 10/20/2017 CLINICAL DATA:  Syncope EXAM: CHEST - 2 VIEW COMPARISON:  February 03, 2012 FINDINGS: No edema or consolidation. Heart is mildly enlarged with pulmonary vascularity normal. There is aortic atherosclerosis. No adenopathy. Bones are osteoporotic. IMPRESSION: No edema or consolidation. Mild cardiac enlargement. Bones osteoporotic. There is aortic atherosclerosis. Aortic Atherosclerosis (ICD10-I70.0). Electronically Signed   By: Lowella Grip III M.D.   On: 10/20/2017 13:28   Ct Angio Neck W Or Wo Contrast  Result Date: 10/20/2017 CLINICAL DATA:  Acute presentation with generalized weakness. Unresponsive. EXAM: CT ANGIOGRAPHY HEAD AND NECK TECHNIQUE: Multidetector CT imaging of the head and neck was performed using the standard protocol during bolus administration of intravenous contrast. Multiplanar CT image reconstructions and MIPs were obtained to evaluate the vascular anatomy. Carotid stenosis measurements (when applicable) are obtained utilizing NASCET criteria, using the distal internal carotid diameter as the denominator. CONTRAST:  45mL ISOVUE-370 IOPAMIDOL (ISOVUE-370) INJECTION 76% COMPARISON:  03/08/2010 FINDINGS: CT HEAD FINDINGS Brain: Age related atrophy. Mild chronic small-vessel change of the hemispheric white matter. No sign of acute infarction, mass lesion, hemorrhage, hydrocephalus or extra-axial collection. Vascular: There is atherosclerotic calcification of the major vessels at the base of the brain. Skull: Negative Sinuses: Clear Orbits: Normal Review of the MIP images confirms the above findings CTA  NECK FINDINGS Aortic arch: Aortic atherosclerosis. No aneurysm or dissection. Branching pattern of the brachiocephalic vessels is normal without origin stenosis. Right carotid system: Common carotid artery is widely patent to the bifurcation. There is advanced plaque at the bifurcation and proximal ICA. Lumen is as narrow as 1 mm. Compared to a more distal cervical ICA diameter of 5 mm, this indicates an 80% or greater stenosis. Cervical ICA is patent beyond the bulb. Left carotid system: Common carotid artery is tortuous but widely patent to the bifurcation. There is atherosclerotic calcification in the ICA bulb with luminal diameter as narrow as 1.5 mm. Compared to a more distal cervical ICA diameter of 5 mm, this indicates a 70% stenosis. Cervical ICA is widely patent beyond the bulb. Vertebral arteries: Both subclavian arteries show atherosclerotic change proximal to the vertebral origins but no flow limiting stenosis. There is atherosclerotic plaque at both vertebral artery origins. Stenosis is estimated at approximately 50% on each side. Both vertebral arteries are patent through the cervical region but show areas of atherosclerotic plaque. No flow limiting vertebral stenosis beyond the origins. Skeleton: Ordinary mild cervical spondylosis. Other neck: Question slight asymmetric prominence in the right posterior nasopharynx. Cannot rule out nasopharyngeal mass in the early stages. Consider direct inspection. No other neck finding. Upper chest: Mild scarring.  No active process. Review of the MIP images confirms the above findings CTA HEAD FINDINGS Anterior circulation: Atherosclerotic calcification in both carotid siphon  regions with stenosis estimated at 50%. Beyond that, no anterior or middle cerebral stenosis is seen on the left. On the right, the middle cerebral artery appears widely patent. There is stenosis of the A1 segment. Note that there is a patent anterior communicating artery, which would be  protective. No missing distal vessels are identified. Posterior circulation: Both vertebral arteries are patent at and through the foramen magnum. Both vertebral arteries reach the basilar. No basilar stenosis. Posterior circulation branch vessels show flow. Venous sinuses: Patent and normal. Anatomic variants: None significant. Delayed phase: No abnormal enhancement. Review of the MIP images confirms the above findings IMPRESSION: No acute brain finding by CT. Advanced atherosclerotic disease at both carotid bifurcations. On the right, the luminal diameter is 1 mm or less in the ICA bulb region, consistent with 80% or greater stenosis. On the left, atherosclerotic disease at the distal bulb results in narrowing of the lumen to 1.5 mm in diameter, consistent with a 70% stenosis. Atherosclerotic change in both carotid siphon regions with estimated 50% stenosis in the siphon regions. Moderate stenosis of the proximal anterior cerebral artery on the right. No other focal large or medium vessel narrowing beyond the siphons. Atherosclerotic change at both vertebral artery origins. Estimated 50% stenosis at each vertebral artery origin, but sufficient patency beyond that. Electronically Signed   By: Nelson Chimes M.D.   On: 10/20/2017 14:44   Mr Brain Wo Contrast  Result Date: 10/20/2017 CLINICAL DATA:  Episode of loss of consciousness EXAM: MRI HEAD WITHOUT CONTRAST TECHNIQUE: Multiplanar, multiecho pulse sequences of the brain and surrounding structures were obtained without intravenous contrast. COMPARISON:  CT same day FINDINGS: Brain: Diffusion imaging does not show any acute or subacute infarction. There chronic small-vessel ischemic changes of the pons. There is cerebellar atrophy without focal insult. Cerebral hemispheres show generalized atrophy with mild chronic small-vessel change of the white matter. No large vessel territory infarction. No mass lesion, hemorrhage, hydrocephalus or extra-axial collection.  Vascular: Major vessels at the base of the brain show flow. Skull and upper cervical spine: Negative Sinuses/Orbits: Clear/normal Other: None IMPRESSION: No acute finding. Age related atrophy. Mild chronic small-vessel ischemic change. Electronically Signed   By: Nelson Chimes M.D.   On: 10/20/2017 15:33     Labs:   Basic Metabolic Panel: Recent Labs  Lab 10/20/17 1239 10/20/17 1247  NA 139 139  K 4.5 4.4  CL 102 101  CO2 25  --   GLUCOSE 219* 218*  BUN 19 22  CREATININE 1.12 1.00  CALCIUM 9.2  --    GFR CrCl cannot be calculated (Unknown ideal weight.). Liver Function Tests: Recent Labs  Lab 10/20/17 1239  AST 18  ALT 12  ALKPHOS 50  BILITOT 0.8  PROT 6.6  ALBUMIN 3.4*   Recent Labs  Lab 10/20/17 1239  LIPASE 33   No results for input(s): AMMONIA in the last 168 hours. Coagulation profile Recent Labs  Lab 10/20/17 1209  INR 1.08    CBC: Recent Labs  Lab 10/20/17 1209 10/20/17 1247  WBC 10.0  --   NEUTROABS 8.7*  --   HGB 10.8* 10.9*  HCT 35.1* 32.0*  MCV 87.5  --   PLT 197  --    Cardiac Enzymes: No results for input(s): CKTOTAL, CKMB, CKMBINDEX, TROPONINI in the last 168 hours. BNP: Invalid input(s): POCBNP CBG: Recent Labs  Lab 10/20/17 2007 10/21/17 0604 10/21/17 1105  GLUCAP 161* 160* 216*   D-Dimer No results for input(s): DDIMER in  the last 72 hours. Hgb A1c Recent Labs    10/21/17 0408  HGBA1C 8.3*   Lipid Profile Recent Labs    10/21/17 0408  CHOL 128  HDL 36*  LDLCALC 74  TRIG 91  CHOLHDL 3.6   Thyroid function studies No results for input(s): TSH, T4TOTAL, T3FREE, THYROIDAB in the last 72 hours.  Invalid input(s): FREET3 Anemia work up No results for input(s): VITAMINB12, FOLATE, FERRITIN, TIBC, IRON, RETICCTPCT in the last 72 hours. Microbiology No results found for this or any previous visit (from the past 240 hour(s)).   Discharge Instructions:   Discharge Instructions    Ambulatory referral to Neurology    Complete by:  As directed    An appointment is requested in approximately: 4 weeks   Diet Carb Modified   Complete by:  As directed    Discharge instructions   Complete by:  As directed    Want BP to remain >120/80 for brain perfusion-- avoid hypotension Need to follow up closely with vascular surgery for management of your carotid stenosis   Increase activity slowly   Complete by:  As directed      Allergies as of 10/21/2017      Reactions   Levaquin [levofloxacin In D5w] Nausea Only   Tramadol Other (See Comments)   somnolence      Medication List    STOP taking these medications   fosinopril 20 MG tablet Commonly known as:  MONOPRIL     TAKE these medications   acetaminophen 500 MG tablet Commonly known as:  TYLENOL Take 500 mg by mouth every 6 (six) hours as needed for mild pain.   aspirin 325 MG EC tablet Take 1 tablet (325 mg total) by mouth daily. Start taking on:  10/22/2017 What changed:    medication strength  how much to take   atorvastatin 20 MG tablet Commonly known as:  LIPITOR TAKE 1 TABLET (20 MG TOTAL) BY MOUTH DAILY.   bromocriptine 2.5 MG tablet Commonly known as:  PARLODEL TAKE 1/2 TABLET AT BEDTIME   ciclopirox 0.77 % cream Commonly known as:  LOPROX Apply 1 application topically daily.   cilostazol 100 MG tablet Commonly known as:  PLETAL TAKE 1 TABLET BY MOUTH TWICE A DAY   clobetasol 0.05 % external solution Commonly known as:  TEMOVATE Apply topically 2 (two) times daily.   glucose blood test strip Commonly known as:  ONETOUCH VERIO 1 each by Other route daily. And lancets 1/day 250.00   isosorbide mononitrate 60 MG 24 hr tablet Commonly known as:  IMDUR Take 0.5 tablets (30 mg total) by mouth daily. Must keep scheduled appt for future refills   metFORMIN 500 MG 24 hr tablet Commonly known as:  GLUCOPHAGE-XR Take 2 tablets (1,000 mg total) by mouth 2 (two) times daily.   metoprolol succinate 50 MG 24 hr tablet Commonly  known as:  TOPROL-XL TAKE 1 TABLET BY MOUTH DAILY WITH OR IMMEDIATELY FOLLOWING A MEAL   repaglinide 1 MG tablet Commonly known as:  PRANDIN Take 1 tablet (1 mg total) by mouth 3 (three) times daily before meals.   tiZANidine 2 MG tablet Commonly known as:  ZANAFLEX Take 1 tablet (2 mg total) by mouth every 6 (six) hours as needed for muscle spasms.   TRAVATAN Z 0.004 % Soln ophthalmic solution Generic drug:  Travoprost (BAK Free) Place 1 drop into both eyes at bedtime.      Follow-up Information    [name], MD Follow  up.   Specialties:  Internal Medicine, Radiology Why:  for echo results Contact information: [place]  [address] [phone]        Wellington Hampshire, MD .   Specialty:  Cardiology Contact information: [address] [place] [address] [phone]            Time coordinating discharge: 35 min  Signed:  Geradine Girt  [facility] 10/21/2017, 6:03 PM

## 2017-10-21 NOTE — Progress Notes (Signed)
OT Cancellation Note  Patient Details Name: Jeffrey Kaiser MRN: [id number] DOB: 1925-11-21   Cancelled Treatment:    Reason Eval/Treat Not Completed: OT screened, no needs identified, will sign off  Peri Maris  [phone] 10/21/2017, 4:05 PM

## 2017-10-21 NOTE — Evaluation (Signed)
Physical Therapy Evaluation Patient Details Name: Jeffrey Kaiser MRN: [id number] DOB: May 10, 1925 Today's Date: 10/21/2017   History of Present Illness  Patient is a 84 y/o male presenting with dizziness and near syncope. Per family, patient became "non-responsive" after using the restroom. CTA head and neck: advanced atherosclerotic disease both carotid bifurcations. Right >80% stenosis, left 70% stenosis. MRI with no acute findings. PMH significant for CAD, DM, HLD, HTN.    Clinical Impression  Jeffrey Kaiser is a very pleasant 84 y/o male admitted for neuro work-up. Patient reporting that prior to admission he lived at home with his wife and was independent with all aspects of mobility. Patient today performing all transfers and mobility with min guard to supervision only for general safety. Able to navigate hallways and up/down 3 steps without AD or physical assist from PT. Patient feels as if he is at his baseline level of functioning. All education completed with no further acute PT needs identified. PT to sign off. Thanks for the referral.     Follow Up Recommendations No PT follow up;Supervision - Intermittent    Equipment Recommendations  None recommended by PT    Recommendations for Other Services       Precautions / Restrictions Precautions Precautions: Fall Restrictions Weight Bearing Restrictions: No      Mobility  Bed Mobility               General bed mobility comments: up in recliner upon PT arrival  Transfers Overall transfer level: Modified independent Equipment used: None                Ambulation/Gait Ambulation/Gait assistance: Min guard;Supervision Gait Distance (Feet): 250 Feet Assistive device: None Gait Pattern/deviations: Step-through pattern;Decreased stride length;Trunk flexed Gait velocity: decreased   General Gait Details: patient reporting at baseline; min guard/sup only for safety  Stairs Stairs: Yes Stairs assistance: Min  guard;Supervision Stair Management: Two rails;Step to pattern;Forwards Number of Stairs: 3    Wheelchair Mobility    Modified Rankin (Stroke Patients Only)       Balance Overall balance assessment: Mild deficits observed, not formally tested                                           Pertinent Vitals/Pain Pain Assessment: No/denies pain    Home Living Family/patient expects to be discharged to:: Private residence Living Arrangements: Spouse/significant other Available Help at Discharge: Family;Available PRN/intermittently Type of Home: House Home Access: Stairs to enter Entrance Stairs-Rails: None Entrance Stairs-Number of Steps: 1 Home Layout: Two level(will sometimes sleep on couch downstairs) Home Equipment: Cane - single point;Shower seat - built in      Prior Function Level of Independence: Independent               Journalist, newspaper        Extremity/Trunk Assessment   Upper Extremity Assessment Upper Extremity Assessment: Defer to OT evaluation    Lower Extremity Assessment Lower Extremity Assessment: Overall WFL for tasks assessed    Cervical / Trunk Assessment Cervical / Trunk Assessment: Kyphotic(excessive cervical flexion)  Communication   Communication: No difficulties  Cognition Arousal/Alertness: Awake/alert Behavior During Therapy: WFL for tasks assessed/performed Overall Cognitive Status: Within Functional Limits for tasks assessed  General Comments      Exercises     Assessment/Plan    PT Assessment Patent does not need any further PT services  PT Problem List         PT Treatment Interventions      PT Goals (Current goals can be found in the Care Plan section)  Acute Rehab PT Goals Patient Stated Goal: return home PT Goal Formulation: With patient Time For Goal Achievement: 10/28/17 Potential to Achieve Goals: Good    Frequency     Barriers to  discharge        Co-evaluation               AM-PAC PT "6 Clicks" Daily Activity  Outcome Measure Difficulty turning over in bed (including adjusting bedclothes, sheets and blankets)?: A Little Difficulty moving from lying on back to sitting on the side of the bed? : A Little Difficulty sitting down on and standing up from a chair with arms (e.g., wheelchair, bedside commode, etc,.)?: A Little Help needed moving to and from a bed to chair (including a wheelchair)?: A Little Help needed walking in hospital room?: A Little Help needed climbing 3-5 steps with a railing? : A Little 6 Click Score: 18    End of Session Equipment Utilized During Treatment: Gait belt Activity Tolerance: Patient tolerated treatment well Patient left: in chair;with call bell/phone within reach;with chair alarm set Nurse Communication: Mobility status PT Visit Diagnosis: Other abnormalities of gait and mobility (R26.89);Muscle weakness (generalized) (M62.81)    Time: 1194-1740 PT Time Calculation (min) (ACUTE ONLY): 17 min   Charges:   PT Evaluation $PT Eval Moderate Complexity: 1 Mod     PT G Codes:        Lanney Gins, PT, DPT 10/21/17 9:30 AM Pager: [phone]

## 2017-10-21 NOTE — Progress Notes (Signed)
SLP Cancellation Note  Patient Details Name: DELAWRENCE FRIDMAN MRN: [id number] DOB: 02/14/1926   Cancelled treatment:       Reason Eval/Treat Not Completed: SLP screened, no needs identified, will sign off   Cynara Tatham 10/21/2017, 11:09 AM

## 2017-10-21 NOTE — Progress Notes (Signed)
Pt admitted from home to ED   with slurred speech and nausea, unresponsive ness for 3 min.s. and symptoms resolved. Alert ,awake and oriented x 4. Neuro intact , moves all extremities. Family at bedside .POC discussed with pt and family ,call bell within reach to call as needed  SR on the monitor. Will continue to monitor.

## 2017-10-22 ENCOUNTER — Telehealth: Payer: Self-pay | Admitting: *Deleted

## 2017-10-22 NOTE — Telephone Encounter (Signed)
Transition Care Management Follow-up Telephone Call   Date discharged? 10/21/17   How have you been since you were released from the hospital? Pt states he is doing fine   Do you understand why you were in the hospital? YES   Do you understand the discharge instructions? YES   Where were you discharged to? Home   Items Reviewed:  Medications reviewed: YES  Allergies reviewed: YES  Dietary changes reviewed: YES, heart healthy  Referrals reviewed: No referral needed   Functional Questionnaire:   Activities of Daily Living (ADLs):   He states he are independent in the following: bathing and hygiene, feeding, continence, grooming, toileting and dressing States he require assistance with the following: ambulation   Any transportation issues/concerns?: NO   Any patient concerns? NO   Confirmed importance and date/time of follow-up visits scheduled YES, appt 10/29/17  Provider Appointment booked with Dr. Jenny Reichmann  Confirmed with patient if condition begins to worsen call PCP or go to the ER.  Patient was given the office number and encouraged to call back with question or concerns.  : YES

## 2017-10-27 ENCOUNTER — Other Ambulatory Visit: Payer: Self-pay | Admitting: Cardiovascular Disease

## 2017-10-27 DIAGNOSIS — I739 Peripheral vascular disease, unspecified: Secondary | ICD-10-CM

## 2017-10-29 ENCOUNTER — Ambulatory Visit (INDEPENDENT_AMBULATORY_CARE_PROVIDER_SITE_OTHER): Payer: Medicare Other | Admitting: Internal Medicine

## 2017-10-29 ENCOUNTER — Encounter: Payer: Self-pay | Admitting: Internal Medicine

## 2017-10-29 VITALS — BP 116/72 | HR 61 | Temp 97.8°F | Ht 65.0 in | Wt 146.0 lb

## 2017-10-29 DIAGNOSIS — E1159 Type 2 diabetes mellitus with other circulatory complications: Secondary | ICD-10-CM | POA: Diagnosis not present

## 2017-10-29 DIAGNOSIS — I6523 Occlusion and stenosis of bilateral carotid arteries: Secondary | ICD-10-CM

## 2017-10-29 DIAGNOSIS — I10 Essential (primary) hypertension: Secondary | ICD-10-CM | POA: Diagnosis not present

## 2017-10-29 DIAGNOSIS — G459 Transient cerebral ischemic attack, unspecified: Secondary | ICD-10-CM | POA: Diagnosis not present

## 2017-10-29 NOTE — Patient Instructions (Signed)
Please continue all other medications as before, and refills have been done if requested.  Please have the pharmacy call with any other refills you may need.  Please continue your efforts at being more active, low cholesterol diet, and weight control.  You are otherwise up to date with prevention measures today.  Please keep your appointments with your specialists as you may have planned   

## 2017-10-29 NOTE — Progress Notes (Signed)
Subjective:    Patient ID: Jeffrey Kaiser, male    DOB: 22-Oct-1925, 84 y.o.   MRN: [id number]  HPI  84 y.o.malewith medical history significant ofCAD, DM, HLD, HTN who presents after a "non-responsive" episode at time of admit 7/16 (and d/c 7/17) . According to patient, he went to the bathroom, had a small BM, felt very nauseous, dizzy, and had gagging episodes. He then went into the den in his recliner. He sat and his family members report that he was awake, but had his eyes closed and did not respond to their questions or interact. The entire episode lasted about 15 minutes. Wife also reports that during this episode, she noticed a left facial droop. He has a chronic left "lazy eye lid." In the ED, his main complaint was hunger. He had no other physical complaints and stated feeling well. His family members at bedside report that his mentation was back to baseline.Pt was dx with:TIAdue to cough related presyncope episode in the setting of stenosis of bilateral ICAs  MRIno acute stroke  CTA head and neck right ICA bulb high-grade stenosis, left ICA bulb bulb 70% stenosis, right VA origin atherosclerosis, bilateral ICA siphons high-grade stenosis  2D Echo:- Left ventricle: The cavity size was normal. There was mild concentric hypertrophy. Systolic function was normal. The estimated ejection fraction was in the range of 55% to 60%. Wall motion was normal; there were no regional wall motion abnormalities. Doppler parameters are consistent with abnormal left ventricular relaxation (grade 1 diastolic dysfunction). Doppler parameters are consistent with high ventricular filling pressure.  SWH67- on statin  HgbA1c8.3 and diet improvement recommended  aspirin 81 mg daily andplatelprior to admission-- continue the same per neurology Has f/u vascular surgury f/u planned aug 2.  Pt today has no new complaints Past Medical History:  Diagnosis Date  . Anemia 09/04/2010  .  Arthritis   . BPH (benign prostatic hyperplasia) 09/09/2010  . CAD (coronary artery disease)   . DDD (degenerative disc disease), cervical 09/04/2010  . Diabetes mellitus   . Diastolic dysfunction 5/91/6384  . Diverticulitis   . Glaucoma   . Hernia   . History of colonic diverticulitis 09/04/2010  . Hyperlipidemia   . Hypertension   . Psoriasis 09/04/2010   Past Surgical History:  Procedure Laterality Date  . broken legs  1981 and 1995  . PERIPHERAL VASCULAR CATHETERIZATION N/A 10/25/2014   Procedure: Abdominal Aortogram;  Surgeon: Wellington Hampshire, MD;  Location: [facility];  Service: Cardiovascular;  Laterality: N/A;  . PERIPHERAL VASCULAR CATHETERIZATION Bilateral 10/25/2014   Procedure: Lower Extremity Angiography;  Surgeon: Wellington Hampshire, MD;  Location: [place] [facility];  Service: Cardiovascular;  Laterality: Bilateral;  . s/p lipoma  2003  . TONSILLECTOMY  1952    reports that he has never smoked. He has never used smokeless tobacco. He reports that he does not drink alcohol or use drugs. family history includes Arthritis in his father and mother; Cancer in his other; Heart disease in his father and mother; Hypertension in his father and mother. Allergies  Allergen Reactions  . Levaquin [Levofloxacin In D5w] Nausea Only  . Tramadol Other (See Comments)    somnolence   Current Outpatient Medications on File Prior to Visit  Medication Sig Dispense Refill  . acetaminophen (TYLENOL) 500 MG tablet Take 500 mg by mouth every 6 (six) hours as needed for mild pain.    Marland Kitchen aspirin EC 81 MG tablet Take 81 mg by mouth  daily.    . atorvastatin (LIPITOR) 20 MG tablet TAKE 1 TABLET (20 MG TOTAL) BY MOUTH DAILY. 90 tablet 2  . bromocriptine (PARLODEL) 2.5 MG tablet TAKE 1/2 TABLET AT BEDTIME 45 tablet 0  . ciclopirox (LOPROX) 0.77 % cream Apply 1 application topically daily.     . cilostazol (PLETAL) 100 MG tablet TAKE 1 TABLET BY MOUTH TWICE A DAY 180 tablet 1  . clobetasol  (TEMOVATE) 0.05 % external solution Apply topically 2 (two) times daily. 50 mL 1  . glucose blood (ONETOUCH VERIO) test strip 1 each by Other route daily. And lancets 1/day 250.00 100 each 12  . isosorbide mononitrate (IMDUR) 60 MG 24 hr tablet Take 0.5 tablets (30 mg total) by mouth daily. Must keep scheduled appt for future refills 15 tablet 0  . metFORMIN (GLUCOPHAGE-XR) 500 MG 24 hr tablet Take 2 tablets (1,000 mg total) by mouth 2 (two) times daily. 120 tablet 11  . metoprolol succinate (TOPROL-XL) 50 MG 24 hr tablet TAKE 1 TABLET BY MOUTH DAILY WITH OR IMMEDIATELY FOLLOWING A MEAL 90 tablet 2  . repaglinide (PRANDIN) 1 MG tablet Take 1 tablet (1 mg total) by mouth 3 (three) times daily before meals. 90 tablet 11  . tiZANidine (ZANAFLEX) 2 MG tablet Take 1 tablet (2 mg total) by mouth every 6 (six) hours as needed for muscle spasms. 60 tablet 1  . TRAVATAN Z 0.004 % SOLN ophthalmic solution Place 1 drop into both eyes at bedtime.  4   No current facility-administered medications on file prior to visit.    Review of Systems  Constitutional: Negative for other unusual diaphoresis or sweats HENT: Negative for ear discharge or swelling Eyes: Negative for other worsening visual disturbances Respiratory: Negative for stridor or other swelling  Gastrointestinal: Negative for worsening distension or other blood Genitourinary: Negative for retention or other urinary change Musculoskeletal: Negative for other MSK pain or swelling Skin: Negative for color change or other new lesions Neurological: Negative for worsening tremors and other numbness  Psychiatric/Behavioral: Negative for worsening agitation or other fatigue All other system neg per pt    Objective:   Physical Exam BP 116/72   Pulse 61   Temp 97.8 F (36.6 C) (Oral)   Ht 5\' 5"  (1.651 m)   Wt 146 lb (66.2 kg)   SpO2 96%   BMI 24.30 kg/m  VS noted, not ill appearing Constitutional: Pt appears in NAD HENT: Head: NCAT.  Right  Ear: External ear normal.  Left Ear: External ear normal.  Eyes: . Pupils are equal, round, and reactive to light. Conjunctivae and EOM are normal Nose: without d/c or deformity Neck: Neck supple. Gross normal ROM Cardiovascular: Normal rate and regular rhythm.   Pulmonary/Chest: Effort normal and breath sounds without rales or wheezing.  Abd:  Soft, NT, ND, + BS, no organomegaly Neurological: Pt is alert. At baseline orientation, motor 5/5 intact, CN 2-12 intact Skin: Skin is warm. No rashes, other new lesions, no LE edema Psychiatric: Pt behavior is normal without agitation  No other exam findings    Assessment & Plan:

## 2017-10-30 NOTE — Assessment & Plan Note (Signed)
stable overall by history and exam, recent data reviewed with pt, and pt to continue medical treatment as before,  to f/u any worsening symptoms or concerns, for better diet with goal a1c < 7.5

## 2017-10-30 NOTE — Assessment & Plan Note (Signed)
stable overall by history and exam, recent data reviewed with pt, and pt to continue medical treatment as before,  to f/u any worsening symptoms or concerns  

## 2017-10-30 NOTE — Addendum Note (Signed)
Addended by: [name] on: 10/30/2017 10:50 AM   Modules accepted: Level of Service

## 2017-10-30 NOTE — Assessment & Plan Note (Signed)
Stable, to cont same tx, and vascular f/u as planned

## 2017-11-02 ENCOUNTER — Telehealth: Payer: Self-pay

## 2017-11-02 ENCOUNTER — Telehealth: Payer: Self-pay | Admitting: Internal Medicine

## 2017-11-02 NOTE — Telephone Encounter (Signed)
Copied from [place] [phone]. Topic: General - Other >> Nov 02, 2017 10:21 AM Oneta Rack wrote: Caller name: Renee  Relation to pt: daughter  Call back number: [phone]    Reason for call:  Need of clarity regarding frequency / dosage of aspirin EC tablet, please leave a detail message.  >> Nov 02, 2017  2:19 PM Percell Belt A wrote: Pt daughter had another question. She stated that he took 325 on Saturday, 325 on Sunday and 162 today.  She would like to know if he still is ok and on track to take the 81mg  of the Asprin  tomorrow?

## 2017-11-02 NOTE — Telephone Encounter (Signed)
In the chart it stats that the patient should be taking one 81mg  daily. Is that correct?   Copied from [place] [phone]. Topic: General - Other >> Nov 02, 2017 10:21 AM Oneta Rack wrote: Caller name: Renee  Relation to pt: daughter  Call back number: [phone]    Reason for call:  Need of clarity regarding frequency / dosage of aspirin EC tablet, please leave a detail message.

## 2017-11-02 NOTE — Telephone Encounter (Signed)
Called pt's daughter, LVM wit hdetails below.

## 2017-11-02 NOTE — Telephone Encounter (Signed)
Yes, due to the history of TIA

## 2017-11-03 NOTE — Telephone Encounter (Signed)
Please clarify with family; I think pt reported that after OV with Dr Posey Pronto neurology that he would be on asa 81 mg and pletal only;    I dont have that documentation.  This neurology visit I believe was post hospital d/c but this could be an error  The d/c summary 7/25 states to take asa 325 per day; I would continue this dose unless otherwise directed by neurology after his hosps

## 2017-11-03 NOTE — Telephone Encounter (Signed)
Pt's daughter has been informed and expressed understanding.  

## 2017-11-06 ENCOUNTER — Other Ambulatory Visit: Payer: Self-pay

## 2017-11-06 ENCOUNTER — Encounter: Payer: Self-pay | Admitting: Vascular Surgery

## 2017-11-06 ENCOUNTER — Telehealth: Payer: Self-pay

## 2017-11-06 ENCOUNTER — Ambulatory Visit (INDEPENDENT_AMBULATORY_CARE_PROVIDER_SITE_OTHER): Payer: Medicare Other | Admitting: Vascular Surgery

## 2017-11-06 VITALS — BP 208/80 | HR 76 | Resp 18 | Ht 65.0 in | Wt 143.0 lb

## 2017-11-06 DIAGNOSIS — I6523 Occlusion and stenosis of bilateral carotid arteries: Secondary | ICD-10-CM

## 2017-11-06 MED ORDER — CLOPIDOGREL BISULFATE 75 MG PO TABS
75.0000 mg | ORAL_TABLET | Freq: Every day | ORAL | 6 refills | Status: DC
Start: 2017-11-06 — End: 2018-05-04

## 2017-11-06 NOTE — Telephone Encounter (Signed)
   [facility] Pre-operative Risk Assessment    Request for surgical clearance:  1. What type of surgery is being performed? Right CEA vs TCAR   2. When is this surgery scheduled? 11/12/17   3. What type of clearance is required (medical clearance vs. Pharmacy clearance to hold med vs. Both)? Both  4. Are there any medications that need to be held prior to surgery and how long? Aspirin   5. Practice name and name of physician performing surgery? [facility]  - Dr. Servando Snare  6. What is your office phone number [phone]    7.   What is your office fax number [phone]  8.   Anesthesia type (None, local, MAC, general) ?  unknown   Jeffrey Kaiser 11/06/2017, 4:48 PM  _________________________________________________________________   (provider comments below)

## 2017-11-06 NOTE — Progress Notes (Signed)
Patient ID: Jeffrey Kaiser, male   DOB: June 29, 1925, 84 y.o.   MRN: [id number]  Reason for Consult: New Patient (Initial Visit) (carotid stenosis)   Referred by [name], MD  Subjective:     HPI:  Jeffrey Kaiser is a 84 y.o. male recently evaluated hospital with left facial droop.  He was not given TPA given that the symptoms had resolved.  He had an MRI that was negative for stroke and was discharged home on aspirin and statin.  He now follows up for evaluation.  He has had a CT angios demonstrating high-grade stenosis bilaterally.  He has had not had any recurrent symptoms.  He is followed by Dr. Fletcher Anon for peripheral vascular disease.  Past Medical History:  Diagnosis Date  . Anemia 09/04/2010  . Arthritis   . BPH (benign prostatic hyperplasia) 09/09/2010  . CAD (coronary artery disease)   . DDD (degenerative disc disease), cervical 09/04/2010  . Diabetes mellitus   . Diastolic dysfunction 5/00/3704  . Diverticulitis   . Glaucoma   . Hernia   . History of colonic diverticulitis 09/04/2010  . Hyperlipidemia   . Hypertension   . Psoriasis 09/04/2010   Family History  Problem Relation Age of Onset  . Arthritis Mother   . Heart disease Mother   . Hypertension Mother   . Arthritis Father   . Heart disease Father   . Hypertension Father   . Cancer Other        lung cancer   Past Surgical History:  Procedure Laterality Date  . broken legs  1981 and 1995  . PERIPHERAL VASCULAR CATHETERIZATION N/A 10/25/2014   Procedure: Abdominal Aortogram;  Surgeon: Wellington Hampshire, MD;  Location: [facility] [facility];  Service: Cardiovascular;  Laterality: N/A;  . PERIPHERAL VASCULAR CATHETERIZATION Bilateral 10/25/2014   Procedure: Lower Extremity Angiography;  Surgeon: Wellington Hampshire, MD;  Location: [place] [facility] [facility];  Service: Cardiovascular;  Laterality: Bilateral;  . s/p lipoma  2003  . TONSILLECTOMY  1952    Short Social History:  Social History   Tobacco Use  . Smoking  status: Never Smoker  . Smokeless tobacco: Never Used  Substance Use Topics  . Alcohol use: No    Allergies  Allergen Reactions  . Levaquin [Levofloxacin In D5w] Nausea Only  . Tramadol Other (See Comments)    somnolence    Current Outpatient Medications  Medication Sig Dispense Refill  . acetaminophen (TYLENOL) 500 MG tablet Take 500 mg by mouth every 6 (six) hours as needed for mild pain.    Marland Kitchen aspirin EC 81 MG tablet Take 81 mg by mouth daily.    Marland Kitchen atorvastatin (LIPITOR) 20 MG tablet TAKE 1 TABLET (20 MG TOTAL) BY MOUTH DAILY. 90 tablet 2  . bromocriptine (PARLODEL) 2.5 MG tablet TAKE 1/2 TABLET AT BEDTIME 45 tablet 0  . ciclopirox (LOPROX) 0.77 % cream Apply 1 application topically daily.     . cilostazol (PLETAL) 100 MG tablet TAKE 1 TABLET BY MOUTH TWICE A DAY 180 tablet 1  . clobetasol (TEMOVATE) 0.05 % external solution Apply topically 2 (two) times daily. 50 mL 1  . glucose blood (ONETOUCH VERIO) test strip 1 each by Other route daily. And lancets 1/day 250.00 100 each 12  . isosorbide mononitrate (IMDUR) 60 MG 24 hr tablet Take 0.5 tablets (30 mg total) by mouth daily. Must keep scheduled appt for future refills 15 tablet 0  . metFORMIN (GLUCOPHAGE-XR) 500 MG 24 hr tablet  Take 2 tablets (1,000 mg total) by mouth 2 (two) times daily. 120 tablet 11  . metoprolol succinate (TOPROL-XL) 50 MG 24 hr tablet TAKE 1 TABLET BY MOUTH DAILY WITH OR IMMEDIATELY FOLLOWING A MEAL 90 tablet 2  . repaglinide (PRANDIN) 1 MG tablet Take 1 tablet (1 mg total) by mouth 3 (three) times daily before meals. 90 tablet 11  . tiZANidine (ZANAFLEX) 2 MG tablet Take 1 tablet (2 mg total) by mouth every 6 (six) hours as needed for muscle spasms. 60 tablet 1  . TRAVATAN Z 0.004 % SOLN ophthalmic solution Place 1 drop into both eyes at bedtime.  4   No current facility-administered medications for this visit.     Review of Systems  Constitutional:  Constitutional negative. HENT: HENT negative.  Eyes:  Eyes negative.  Respiratory: Respiratory negative.  Cardiovascular: Positive for claudication and leg swelling.  Musculoskeletal: Positive for gait problem.  Skin: Positive for rash.  Neurological: Positive for dizziness and facial asymmetry.  Hematologic: Hematologic/lymphatic negative.  Psychiatric: Psychiatric negative.        Objective:  Objective   Vitals:   11/06/17 1446 11/06/17 1454 11/06/17 1455  BP: (!) 212/90 (!) 201/85 (!) 208/80  Pulse: 76 76 76  Resp: 18    SpO2: 99%    Weight: 143 lb (64.9 kg)    Height: 5\' 5"  (1.651 m)     Body mass index is 23.8 kg/m.  Physical Exam  Constitutional: He is oriented to person, place, and time. He appears well-developed.  HENT:  Head: Normocephalic.  Eyes: Pupils are equal, round, and reactive to light.  Neck: Normal range of motion.  Cardiovascular: Normal rate.  Pulses:      Carotid pulses are 2+ on the right side, and 2+ on the left side.      Radial pulses are 2+ on the right side, and 2+ on the left side.       Femoral pulses are 2+ on the right side, and 2+ on the left side.      Popliteal pulses are 0 on the right side, and 0 on the left side.  Pulmonary/Chest: Effort normal.  Abdominal: Soft.  Musculoskeletal: Normal range of motion. He exhibits no edema.  Neurological: He is alert and oriented to person, place, and time.  Skin: Skin is warm and dry.  Psychiatric: He has a normal mood and affect. His behavior is normal. Judgment and thought content normal.    Data: I have reviewed his CT angios which demonstrates high-grade stenosis with calcium on the right side and 70% stenosis on the left.     Assessment/Plan:     84 year old male with symptomatic high-grade stenosis of his right internal carotid artery.  This does appear to be slightly calcified for the stent but he also has a neck that is very immobile.  From this standpoint given that the lesion is up to the level of C2 I think he would be better suited  with stenting from a trans-carotid standpointe.  I will have this evaluated by the [facility] representatives given the amount of calcium.  If he is deemed to not be a candidate for trans-carotid stenting then we will proceed with carotid endarterectomy in the near future.  Either way I am sending Plavix to his pharmacy so he will be on dual antiplatelets as well as statin drug.  If he has further symptoms I have instructed him to proceed to the nearest emergency department.  He demonstrates very  good understanding we will be in touch regarding his procedure next week.     Waynetta Sandy MD [facility]

## 2017-11-09 ENCOUNTER — Other Ambulatory Visit: Payer: Self-pay | Admitting: *Deleted

## 2017-11-09 NOTE — Telephone Encounter (Signed)
   Primary Cardiologist: [name], MD  Chart reviewed as part of pre-operative protocol coverage. Patient was contacted 11/09/2017 in reference to pre-operative risk assessment for pending surgery as outlined below.  Jeffrey Kaiser was last seen on 07/21/17 by Dr. Fletcher Anon.  Since that day, Jeffrey Kaiser has been stable.  He denies chest pain, shortness of breath, exercise intolerance.  He remains active for 84 years old and does house work as well as yard work.  His functional capacity is good at 5.62 METs according to the [facility] Activity Status Index.    Therefore, based on ACC/AHA guidelines, the patient would be at acceptable risk for the planned procedure without further cardiovascular testing.   I will route this recommendation to the requesting surgeon's office personnel and remove from pre-op pool.  Please call with questions.  Richardson Dopp, PA-C 11/09/2017, 4:57 PM

## 2017-11-09 NOTE — Telephone Encounter (Signed)
   Primary Cardiologist:Muhammad Fletcher Anon, MD  Chart reviewed as part of pre-operative protocol coverage.  Patient has a reported history of coronary artery disease.  He also has a history of peripheral arterial disease treated medically, diabetes, hypertension, hyperlipidemia.  He was recently admitted with a TIA and was noted to carotid artery disease.  Plan is for TCAR vs CEA 11/12/17.  Last seen by Dr. [name] 07/21/17.    RCRI:  6.6 (high risk).  I left a message for the patient call back to assess for clinical changes since his last OV.  Richardson Dopp, PA-C  11/09/2017, 4:18 PM

## 2017-11-10 ENCOUNTER — Encounter (HOSPITAL_COMMUNITY): Payer: Self-pay | Admitting: *Deleted

## 2017-11-10 NOTE — Progress Notes (Signed)
Denies chest pain or shortness of breath. Cardiologist note in chart provided the following instructions regarding DM.    How do I manage my blood sugar before surgery? . Check your blood sugar at least 4 times a day, starting 2 days before surgery, to make sure that the level is not too high or low. o Check your blood sugar the morning of your surgery when you wake up and every 2 hours until you get to the Short Stay unit. . If your blood sugar is less than 70 mg/dL, you will need to treat for low blood sugar: o Do not take insulin. o Treat a low blood sugar (less than 70 mg/dL) with  cup of clear juice (cranberry or apple), 4 glucose tablets, OR glucose gel. Recheck blood sugar in 15 minutes after treatment (to make sure it is greater than 70 mg/dL). If your blood sugar is not greater than 70 mg/dL on recheck, call [phone] o  for further instructions. . Report your blood sugar to the short stay nurse when you get to Short Stay.  . If you are admitted to the hospital after surgery: o Your blood sugar will be checked by the staff and you will probably be given insulin after surgery (instead of oral diabetes medicines) to make sure you have good blood sugar levels. o The goal for blood sugar control after surgery is 80-180 mg/dL.     . Do not take oral diabetes medicines (pills) the morning of surgery.

## 2017-11-11 NOTE — Progress Notes (Signed)
Anesthesia Chart Review: Kathleene Hazel  Case:  [id number] Date/Time:  11/12/17 0945   Procedure:  TRANSCAROTID ARTERY REVASCULARIZATION (Right )   Anesthesia type:  General   Pre-op diagnosis:  right carotid stenosis with stroke history   Location:  [facility] OR ROOM 16 / [place] OR   Surgeon:  Waynetta Sandy, MD      DISCUSSION: Patient is a 84 year old male scheduled for the above procedure. History includes CAD (details not well documented, but saw Romeo Apple, MD on 08/01/09 for elevated troponin 0.09 enzymes during admission for accelerated HTN and inflammatory abdominal pain; normal LVEF by echo), PAD, DM2, diastolic dysfunction, HTN, HLD, glaucoma, psoriasis, anemia.  - He was recently admitted 10/20/17-10/21/17 for TIA ("non-responsive" episode with left facial droop). He did not require tPA as his symptoms improved. CTA showed bilateral (R > L) ICA stenosis. He was seen by neurology and referred to vascular surgery for further management. According to Dr. Claretha Cooper 11/06/17 note, "84 year old male with symptomatic high-grade stenosis of his right internal carotid artery.  This does appear to be slightly calcified for the stent but he also has a neck that is very immobile.  From this standpoint given that the lesion is up to the level of C2 I think he would be better suited with stenting from a trans-carotid standpointe.  I will have this evaluated by the [place] representatives given the amount of calcium.  If he is deemed to not be a candidate for trans-carotid stenting then we will proceed with carotid endarterectomy in the near future.  Either way I am sending Plavix to his pharmacy so he will be on dual antiplatelets as well as statin drug."  Cardiology felt he was acceptable risk for procedure. He is a same day work-up, so further evaluation and review of labs on the day of surgery by anesthesia and surgeon.    VS: He will get vitals on arrival day of surgery.   PROVIDERS: [name], MD is PCP. Last visit 10/30/17. [name], MD is cardiologist. Last office visit 07/21/17. He denied claudication and angina. Continued medical therapy recommended for PAD and CAD. According to 11/09/17 notation by Richardson Dopp, PA-C, "Wonda Amis was last seen on 07/21/17 by Dr. Fletcher Anon.  Since that day, KIMARI COUDRIET has been stable.  He denies chest pain, shortness of breath, exercise intolerance.  He remains active for 84 years old and does house work as well as yard work.  His functional capacity is good at 5.62 METs according to the [facility] Activity Status Index.   Therefore, based on ACC/AHA guidelines, the patient would be at acceptable risk for the planned procedure without further cardiovascular testing." - Rosalin Hawking, MD is neurologist. Seen during 10/2017 TIA hospitalization. - Renato Shin, MD is endocrinologist. Last visit 06/12/17.    LABS: He is a same day work-up, so he will get updated labs prior to surgery. His most recent labs include: Lab Results  Component Value Date   WBC 10.0 10/20/2017   HGB 10.9 (L) 10/20/2017   HCT 32.0 (L) 10/20/2017   PLT 197 10/20/2017   GLUCOSE 218 (H) 10/20/2017   ALT 12 10/20/2017   AST 18 10/20/2017   NA 139 10/20/2017   K 4.4 10/20/2017   CL 101 10/20/2017   CREATININE 1.00 10/20/2017   BUN 22 10/20/2017   CO2 25 10/20/2017   TSH 2.53 08/27/2017   INR 1.08 10/20/2017   HGBA1C 8.3 (H) 10/21/2017  IMAGES: CTA head/neck 10/20/17: IMPRESSION: - No acute brain finding by CT. - Advanced atherosclerotic disease at both carotid bifurcations. On the right, the luminal diameter is 1 mm or less in the ICA bulb region, consistent with 80% or greater stenosis. On the left, atherosclerotic disease at the distal bulb results in narrowing of the lumen to 1.5 mm in diameter, consistent with a 70% stenosis. - Atherosclerotic change in both carotid siphon regions with estimated 50% stenosis in the siphon regions. Moderate stenosis of  the proximal anterior cerebral artery on the right. No other focal large or medium vessel narrowing beyond the siphons. - Atherosclerotic change at both vertebral artery origins. Estimated 50% stenosis at each vertebral artery origin, but sufficient patency beyond that.  MRI brain 10/20/17: IMPRESSION: No acute finding. Age related atrophy. Mild chronic small-vessel ischemic change.  CXR 10/20/17: IMPRESSION: No edema or consolidation. Mild cardiac enlargement. Bones osteoporotic. There is aortic atherosclerosis. Aortic Atherosclerosis (ICD10-I70.0).   EKG: 10/20/17: SR, PAC, LVH with IVCD and secondary repolarization abnormality (negative lateral T waves). Inferior infarct (old). No significant change since last tracing.    CV: Echo 10/21/17: Study Conclusions - Left ventricle: The cavity size was normal. There was mild   concentric hypertrophy. Systolic function was normal. The   estimated ejection fraction was in the range of 55% to 60%. Wall   motion was normal; there were no regional wall motion   abnormalities. Doppler parameters are consistent with abnormal   left ventricular relaxation (grade 1 diastolic dysfunction).   Doppler parameters are consistent with high ventricular filling   pressure. - Aortic valve: Valve mobility was restricted. Transvalvular   velocity was within the normal range. There was no stenosis.   There was no regurgitation. Valve area (VTI): 1.19 cm^2. Valve   area (Vmean): 1.06 cm^2. - Mitral valve: Transvalvular velocity was within the normal range.   There was no evidence for stenosis. There was trivial   regurgitation. - Left atrium: The atrium was moderately dilated. - Right ventricle: The cavity size was normal. Wall thickness was   normal. Systolic function was normal. - Tricuspid valve: There was no regurgitation.   Past Medical History:  Diagnosis Date  . Anemia 09/04/2010  . Arthritis   . BPH (benign prostatic hyperplasia)  09/09/2010  . CAD (coronary artery disease)   . DDD (degenerative disc disease), cervical 09/04/2010  . Diabetes mellitus   . Diastolic dysfunction 3/41/9622  . Diverticulitis   . Glaucoma   . Hernia   . History of colonic diverticulitis 09/04/2010  . Hyperlipidemia   . Hypertension   . Psoriasis 09/04/2010    Past Surgical History:  Procedure Laterality Date  . broken legs  1981 and 1995  . PERIPHERAL VASCULAR CATHETERIZATION N/A 10/25/2014   Procedure: Abdominal Aortogram;  Surgeon: Wellington Hampshire, MD;  Location: [facility];  Service: Cardiovascular;  Laterality: N/A;  . PERIPHERAL VASCULAR CATHETERIZATION Bilateral 10/25/2014   Procedure: Lower Extremity Angiography;  Surgeon: Wellington Hampshire, MD;  Location: [facility];  Service: Cardiovascular;  Laterality: Bilateral;  . s/p lipoma  2003  . TONSILLECTOMY  1952    MEDICATIONS: No current facility-administered medications for this encounter.    Marland Kitchen acetaminophen (TYLENOL) 500 MG tablet  . aspirin EC 81 MG tablet  . atorvastatin (LIPITOR) 20 MG tablet  . bromocriptine (PARLODEL) 2.5 MG tablet  . ciclopirox (LOPROX) 0.77 % cream  . cilostazol (PLETAL) 100 MG tablet  . clobetasol (TEMOVATE) 0.05 % external  solution  . clopidogrel (PLAVIX) 75 MG tablet  . glucose blood (ONETOUCH VERIO) test strip  . isosorbide mononitrate (IMDUR) 60 MG 24 hr tablet  . metFORMIN (GLUCOPHAGE-XR) 500 MG 24 hr tablet  . metoprolol succinate (TOPROL-XL) 50 MG 24 hr tablet  . repaglinide (PRANDIN) 1 MG tablet  . tiZANidine (ZANAFLEX) 2 MG tablet  . TRAVATAN Z 0.004 % SOLN ophthalmic solution    George Hugh [facility] Short Stay Center/Anesthesiology Phone [phone] 11/11/2017 11:55 AM

## 2017-11-12 ENCOUNTER — Telehealth: Payer: Self-pay | Admitting: *Deleted

## 2017-11-12 ENCOUNTER — Inpatient Hospital Stay (HOSPITAL_COMMUNITY)
Admission: RE | Admit: 2017-11-12 | Discharge: 2017-11-15 | DRG: 036 | Disposition: A | Discharge status: Home / Self Care | Payer: Medicare Other | Attending: Vascular Surgery | Admitting: Vascular Surgery

## 2017-11-12 ENCOUNTER — Encounter (HOSPITAL_COMMUNITY): Payer: Self-pay

## 2017-11-12 ENCOUNTER — Other Ambulatory Visit: Payer: Self-pay

## 2017-11-12 ENCOUNTER — Inpatient Hospital Stay (HOSPITAL_COMMUNITY): Payer: Medicare Other | Admitting: Vascular Surgery

## 2017-11-12 ENCOUNTER — Encounter (HOSPITAL_COMMUNITY)
Admission: RE | Disposition: A | Discharge status: Home / Self Care | Payer: Self-pay | Source: Home / Self Care | Attending: Vascular Surgery

## 2017-11-12 DIAGNOSIS — E785 Hyperlipidemia, unspecified: Secondary | ICD-10-CM | POA: Diagnosis not present

## 2017-11-12 DIAGNOSIS — Z8673 Personal history of transient ischemic attack (TIA), and cerebral infarction without residual deficits: Secondary | ICD-10-CM | POA: Diagnosis not present

## 2017-11-12 DIAGNOSIS — I6523 Occlusion and stenosis of bilateral carotid arteries: Principal | ICD-10-CM | POA: Diagnosis present

## 2017-11-12 DIAGNOSIS — E11649 Type 2 diabetes mellitus with hypoglycemia without coma: Secondary | ICD-10-CM | POA: Diagnosis not present

## 2017-11-12 DIAGNOSIS — I10 Essential (primary) hypertension: Secondary | ICD-10-CM | POA: Diagnosis present

## 2017-11-12 DIAGNOSIS — D649 Anemia, unspecified: Secondary | ICD-10-CM | POA: Diagnosis not present

## 2017-11-12 DIAGNOSIS — E1151 Type 2 diabetes mellitus with diabetic peripheral angiopathy without gangrene: Secondary | ICD-10-CM | POA: Diagnosis not present

## 2017-11-12 DIAGNOSIS — I6521 Occlusion and stenosis of right carotid artery: Secondary | ICD-10-CM | POA: Diagnosis present

## 2017-11-12 DIAGNOSIS — I2510 Atherosclerotic heart disease of native coronary artery without angina pectoris: Secondary | ICD-10-CM | POA: Diagnosis present

## 2017-11-12 DIAGNOSIS — I6529 Occlusion and stenosis of unspecified carotid artery: Secondary | ICD-10-CM | POA: Diagnosis present

## 2017-11-12 HISTORY — PX: TRANSCAROTID ARTERY REVASCULARIZATIONÂ: SHX6778

## 2017-11-12 LAB — COMPREHENSIVE METABOLIC PANEL
ALK PHOS: 45 U/L (ref 38–126)
ALT: 11 U/L (ref 0–44)
AST: 22 U/L (ref 15–41)
Albumin: 4 g/dL (ref 3.5–5.0)
Anion gap: 11 (ref 5–15)
BUN: 15 mg/dL (ref 8–23)
CALCIUM: 9.4 mg/dL (ref 8.9–10.3)
CO2: 26 mmol/L (ref 22–32)
CREATININE: 1.08 mg/dL (ref 0.61–1.24)
Chloride: 104 mmol/L (ref 98–111)
GFR, EST NON AFRICAN AMERICAN: 58 mL/min — AB (ref >60)
Glucose, Bld: 103 mg/dL — ABNORMAL HIGH (ref 70–99)
Potassium: 4.1 mmol/L (ref 3.5–5.1)
Sodium: 141 mmol/L (ref 135–145)
Total Bilirubin: 1 mg/dL (ref 0.3–1.2)
Total Protein: 7.5 g/dL (ref 6.5–8.1)

## 2017-11-12 LAB — TYPE AND SCREEN
ABO/RH(D): O POS
Antibody Screen: NEGATIVE

## 2017-11-12 LAB — POCT ACTIVATED CLOTTING TIME: ACTIVATED CLOTTING TIME: 285 s

## 2017-11-12 LAB — URINALYSIS, ROUTINE W REFLEX MICROSCOPIC
Bilirubin Urine: NEGATIVE
Glucose, UA: NEGATIVE mg/dL
HGB URINE DIPSTICK: NEGATIVE
Ketones, ur: NEGATIVE mg/dL
LEUKOCYTES UA: NEGATIVE
Nitrite: NEGATIVE
Protein, ur: NEGATIVE mg/dL
SPECIFIC GRAVITY, URINE: 1.005 (ref 1.005–1.030)
pH: 6 (ref 5.0–8.0)

## 2017-11-12 LAB — GLUCOSE, CAPILLARY
GLUCOSE-CAPILLARY: 96 mg/dL (ref 70–99)
GLUCOSE-CAPILLARY: 97 mg/dL (ref 70–99)
GLUCOSE-CAPILLARY: 189 mg/dL — AB (ref 70–99)

## 2017-11-12 LAB — CBC
HEMATOCRIT: 39.5 % (ref 39.0–52.0)
HEMOGLOBIN: 12.1 g/dL — AB (ref 13.0–17.0)
MCH: 27.1 pg (ref 26.0–34.0)
MCHC: 30.6 g/dL (ref 30.0–36.0)
MCV: 88.4 fL (ref 78.0–100.0)
Platelets: 187 10*3/uL (ref 150–400)
RBC: 4.47 MIL/uL (ref 4.22–5.81)
RDW: 14.2 % (ref 11.5–15.5)
WBC: 7.4 10*3/uL (ref 4.0–10.5)

## 2017-11-12 LAB — SURGICAL PCR SCREEN
MRSA, PCR: NEGATIVE
Staphylococcus aureus: POSITIVE — AB

## 2017-11-12 LAB — PROTIME-INR
INR: 1.05
PROTHROMBIN TIME: 13.6 s (ref 11.4–15.2)

## 2017-11-12 LAB — ABO/RH: ABO/RH(D): O POS

## 2017-11-12 LAB — APTT: aPTT: 36 seconds (ref 24–36)

## 2017-11-12 SURGERY — TRANSCAROTID ARTERY REVASCULARIZATION (TCAR)
Anesthesia: Monitor Anesthesia Care | Site: Neck | Laterality: Right

## 2017-11-12 MED ORDER — POLYETHYLENE GLYCOL 3350 17 G PO PACK: 17.0000 g | PACK | Freq: Every day | ORAL | Status: DC | PRN

## 2017-11-12 MED ORDER — ACETAMINOPHEN 325 MG RE SUPP: 325.0000 mg | RECTAL | Status: DC | PRN

## 2017-11-12 MED ORDER — ISOSORBIDE MONONITRATE ER 30 MG PO TB24
30.0000 mg | ORAL_TABLET | Freq: Every day | ORAL | Status: DC
  Administered 2017-11-13 – 2017-11-15 (×3): 30 mg via ORAL
  Filled 2017-11-12 (×3): qty 1

## 2017-11-12 MED ORDER — CHLORHEXIDINE GLUCONATE CLOTH 2 % EX PADS: 6.0000 | MEDICATED_PAD | Freq: Once | CUTANEOUS | Status: DC

## 2017-11-12 MED ORDER — BISACODYL 10 MG RE SUPP: 10.0000 mg | Freq: Every day | RECTAL | Status: DC | PRN

## 2017-11-12 MED ORDER — ONDANSETRON HCL 4 MG/2ML IJ SOLN
INTRAMUSCULAR | Status: AC
  Filled 2017-11-12: qty 4

## 2017-11-12 MED ORDER — CLOPIDOGREL BISULFATE 75 MG PO TABS
75.0000 mg | ORAL_TABLET | Freq: Every day | ORAL | Status: DC
  Administered 2017-11-13 – 2017-11-15 (×3): 75 mg via ORAL
  Filled 2017-11-12 (×3): qty 1

## 2017-11-12 MED ORDER — POTASSIUM CHLORIDE CRYS ER 20 MEQ PO TBCR: 20.0000 meq | EXTENDED_RELEASE_TABLET | Freq: Every day | ORAL | Status: DC | PRN

## 2017-11-12 MED ORDER — PHENYLEPHRINE HCL 10 MG/ML IJ SOLN
INTRAMUSCULAR | Status: DC | PRN
  Administered 2017-11-12: 80 ug via INTRAVENOUS

## 2017-11-12 MED ORDER — MORPHINE SULFATE (PF) 2 MG/ML IV SOLN: 2.0000 mg | INTRAVENOUS | Status: DC | PRN

## 2017-11-12 MED ORDER — PHENYLEPHRINE 40 MCG/ML (10ML) SYRINGE FOR IV PUSH (FOR BLOOD PRESSURE SUPPORT)
PREFILLED_SYRINGE | INTRAVENOUS | Status: AC
  Filled 2017-11-12: qty 10

## 2017-11-12 MED ORDER — LATANOPROST 0.005 % OP SOLN
1.0000 [drp] | Freq: Every day | OPHTHALMIC | Status: DC
  Administered 2017-11-12 – 2017-11-14 (×3): 1 [drp] via OPHTHALMIC
  Filled 2017-11-12: qty 2.5

## 2017-11-12 MED ORDER — METOPROLOL TARTRATE 5 MG/5ML IV SOLN
INTRAVENOUS | Status: AC
  Filled 2017-11-12: qty 5

## 2017-11-12 MED ORDER — ASPIRIN EC 81 MG PO TBEC
81.0000 mg | DELAYED_RELEASE_TABLET | Freq: Every day | ORAL | Status: DC
  Administered 2017-11-13 – 2017-11-15 (×3): 81 mg via ORAL
  Filled 2017-11-12 (×3): qty 1

## 2017-11-12 MED ORDER — DOPAMINE-DEXTROSE 3.2-5 MG/ML-% IV SOLN
2.0000 ug/kg/min | INTRAVENOUS | Status: DC
  Administered 2017-11-12: 2 ug/kg/min via INTRAVENOUS

## 2017-11-12 MED ORDER — SODIUM CHLORIDE 0.9 % IV SOLN
INTRAVENOUS | Status: DC | PRN
  Administered 2017-11-12: 500 mL

## 2017-11-12 MED ORDER — LACTATED RINGERS IV SOLN
INTRAVENOUS | Status: DC
  Administered 2017-11-12: 08:00:00 via INTRAVENOUS

## 2017-11-12 MED ORDER — CILOSTAZOL 100 MG PO TABS
100.0000 mg | ORAL_TABLET | Freq: Two times a day (BID) | ORAL | Status: DC
  Administered 2017-11-13 – 2017-11-15 (×5): 100 mg via ORAL
  Filled 2017-11-12 (×5): qty 1

## 2017-11-12 MED ORDER — SODIUM CHLORIDE 0.9 % IV SOLN
INTRAVENOUS | Status: DC | PRN
  Administered 2017-11-12: 50 ug/min via INTRAVENOUS

## 2017-11-12 MED ORDER — SODIUM CHLORIDE 0.9 % IV SOLN: 500.0000 mL | Freq: Once | INTRAVENOUS | Status: DC | PRN

## 2017-11-12 MED ORDER — TIZANIDINE HCL 4 MG PO TABS: 2.0000 mg | ORAL_TABLET | Freq: Four times a day (QID) | ORAL | Status: DC | PRN

## 2017-11-12 MED ORDER — PSEUDOEPHEDRINE HCL 30 MG PO TABS
30.0000 mg | ORAL_TABLET | Freq: Four times a day (QID) | ORAL | Status: DC
  Administered 2017-11-12 – 2017-11-15 (×9): 30 mg via ORAL
  Filled 2017-11-12 (×13): qty 1

## 2017-11-12 MED ORDER — MUPIROCIN 2 % EX OINT
1.0000 "application " | TOPICAL_OINTMENT | Freq: Once | CUTANEOUS | Status: AC
  Administered 2017-11-12: 1 via TOPICAL
  Filled 2017-11-12: qty 22

## 2017-11-12 MED ORDER — PSEUDOEPHEDRINE HCL 30 MG PO TABS
30.0000 mg | ORAL_TABLET | ORAL | Status: AC
  Administered 2017-11-12: 30 mg via ORAL
  Filled 2017-11-12: qty 1

## 2017-11-12 MED ORDER — GLYCOPYRROLATE 0.2 MG/ML IJ SOLN
INTRAMUSCULAR | Status: DC | PRN
  Administered 2017-11-12 (×2): 0.2 mg via INTRAVENOUS

## 2017-11-12 MED ORDER — ALBUMIN HUMAN 5 % IV SOLN
12.5000 g | Freq: Once | INTRAVENOUS | Status: AC
  Administered 2017-11-12: 12.5 g via INTRAVENOUS

## 2017-11-12 MED ORDER — LABETALOL HCL 5 MG/ML IV SOLN: 10.0000 mg | INTRAVENOUS | Status: DC | PRN

## 2017-11-12 MED ORDER — PROTAMINE SULFATE 10 MG/ML IV SOLN
INTRAVENOUS | Status: AC
  Filled 2017-11-12: qty 10

## 2017-11-12 MED ORDER — MUPIROCIN 2 % EX OINT
1.0000 "application " | TOPICAL_OINTMENT | Freq: Two times a day (BID) | CUTANEOUS | Status: DC
  Administered 2017-11-12 – 2017-11-15 (×6): 1 via NASAL

## 2017-11-12 MED ORDER — LIDOCAINE HCL (PF) 1 % IJ SOLN
INTRAMUSCULAR | Status: DC | PRN
  Administered 2017-11-12: 45 mL via INTRADERMAL

## 2017-11-12 MED ORDER — CICLOPIROX OLAMINE 0.77 % EX CREA: 1.0000 "application " | TOPICAL_CREAM | Freq: Every day | CUTANEOUS | Status: DC

## 2017-11-12 MED ORDER — HEMOSTATIC AGENTS (NO CHARGE) OPTIME
TOPICAL | Status: DC | PRN
  Administered 2017-11-12: 1 via TOPICAL

## 2017-11-12 MED ORDER — ONDANSETRON HCL 4 MG/2ML IJ SOLN
4.0000 mg | Freq: Four times a day (QID) | INTRAMUSCULAR | Status: DC | PRN
  Administered 2017-11-13: 4 mg via INTRAVENOUS
  Filled 2017-11-12: qty 2

## 2017-11-12 MED ORDER — BROMOCRIPTINE MESYLATE 2.5 MG PO TABS
1.2500 mg | ORAL_TABLET | Freq: Every day | ORAL | Status: DC
  Administered 2017-11-12 – 2017-11-14 (×3): 1.25 mg via ORAL
  Filled 2017-11-12 (×3): qty 1

## 2017-11-12 MED ORDER — GLYCOPYRROLATE PF 0.2 MG/ML IJ SOSY
PREFILLED_SYRINGE | INTRAMUSCULAR | Status: AC
  Filled 2017-11-12: qty 2

## 2017-11-12 MED ORDER — ALUM & MAG HYDROXIDE-SIMETH 200-200-20 MG/5ML PO SUSP
15.0000 mL | ORAL | Status: DC | PRN
  Administered 2017-11-12: 15 mL via ORAL
  Filled 2017-11-12: qty 30

## 2017-11-12 MED ORDER — SODIUM CHLORIDE 0.9 % IV SOLN: INTRAVENOUS | Status: DC

## 2017-11-12 MED ORDER — GUAIFENESIN-DM 100-10 MG/5ML PO SYRP: 15.0000 mL | ORAL_SOLUTION | ORAL | Status: DC | PRN

## 2017-11-12 MED ORDER — HYDROMORPHONE HCL 1 MG/ML IJ SOLN: 0.2500 mg | INTRAMUSCULAR | Status: DC | PRN

## 2017-11-12 MED ORDER — PHENOL 1.4 % MT LIQD: 1.0000 | OROMUCOSAL | Status: DC | PRN

## 2017-11-12 MED ORDER — IODIXANOL 320 MG/ML IV SOLN
INTRAVENOUS | Status: DC | PRN
  Administered 2017-11-12: 35 mL via INTRAVENOUS

## 2017-11-12 MED ORDER — ONDANSETRON HCL 4 MG/2ML IJ SOLN
INTRAMUSCULAR | Status: DC | PRN
  Administered 2017-11-12: 4 mg via INTRAVENOUS

## 2017-11-12 MED ORDER — DEXAMETHASONE SODIUM PHOSPHATE 10 MG/ML IJ SOLN
INTRAMUSCULAR | Status: AC
  Filled 2017-11-12: qty 3

## 2017-11-12 MED ORDER — LIDOCAINE HCL (PF) 1 % IJ SOLN
INTRAMUSCULAR | Status: AC
  Filled 2017-11-12: qty 30

## 2017-11-12 MED ORDER — ATORVASTATIN CALCIUM 20 MG PO TABS
20.0000 mg | ORAL_TABLET | Freq: Every day | ORAL | Status: DC
  Administered 2017-11-13 – 2017-11-15 (×3): 20 mg via ORAL
  Filled 2017-11-12 (×3): qty 1

## 2017-11-12 MED ORDER — DOPAMINE-DEXTROSE 3.2-5 MG/ML-% IV SOLN
0.0000 ug/kg/min | INTRAVENOUS | Status: DC
  Administered 2017-11-12: 4 ug/kg/min via INTRAVENOUS

## 2017-11-12 MED ORDER — MAGNESIUM SULFATE 2 GM/50ML IV SOLN: 2.0000 g | Freq: Every day | INTRAVENOUS | Status: DC | PRN

## 2017-11-12 MED ORDER — DOPAMINE-DEXTROSE 3.2-5 MG/ML-% IV SOLN
INTRAVENOUS | Status: AC
  Filled 2017-11-12: qty 250

## 2017-11-12 MED ORDER — CEFAZOLIN SODIUM-DEXTROSE 2-4 GM/100ML-% IV SOLN
2.0000 g | INTRAVENOUS | Status: AC
  Administered 2017-11-12: 2 g via INTRAVENOUS

## 2017-11-12 MED ORDER — SODIUM CHLORIDE 0.9 % IV SOLN
INTRAVENOUS | Status: DC
  Administered 2017-11-12 – 2017-11-15 (×5): via INTRAVENOUS

## 2017-11-12 MED ORDER — ALBUMIN HUMAN 5 % IV SOLN
INTRAVENOUS | Status: AC
  Filled 2017-11-12: qty 250

## 2017-11-12 MED ORDER — DEXAMETHASONE SODIUM PHOSPHATE 10 MG/ML IJ SOLN
INTRAMUSCULAR | Status: DC | PRN
  Administered 2017-11-12: 5 mg via INTRAVENOUS

## 2017-11-12 MED ORDER — PROPOFOL 10 MG/ML IV BOLUS
INTRAVENOUS | Status: AC
  Filled 2017-11-12: qty 20

## 2017-11-12 MED ORDER — REPAGLINIDE 0.5 MG PO TABS
1.0000 mg | ORAL_TABLET | Freq: Three times a day (TID) | ORAL | Status: DC
  Administered 2017-11-13 – 2017-11-15 (×6): 1 mg via ORAL
  Filled 2017-11-12: qty 2
  Filled 2017-11-12: qty 1
  Filled 2017-11-12 (×9): qty 2

## 2017-11-12 MED ORDER — HEPARIN SODIUM (PORCINE) 5000 UNIT/ML IJ SOLN
INTRAMUSCULAR | Status: AC
  Filled 2017-11-12: qty 1.2

## 2017-11-12 MED ORDER — CLOBETASOL PROPIONATE 0.05 % EX SOLN: Freq: Two times a day (BID) | CUTANEOUS | Status: DC

## 2017-11-12 MED ORDER — CHLORHEXIDINE GLUCONATE CLOTH 2 % EX PADS
6.0000 | MEDICATED_PAD | Freq: Every day | CUTANEOUS | Status: DC
  Administered 2017-11-12 – 2017-11-15 (×4): 6 via TOPICAL

## 2017-11-12 MED ORDER — CEFAZOLIN SODIUM-DEXTROSE 2-4 GM/100ML-% IV SOLN
2.0000 g | Freq: Three times a day (TID) | INTRAVENOUS | Status: AC
  Administered 2017-11-12 – 2017-11-13 (×2): 2 g via INTRAVENOUS
  Filled 2017-11-12 (×2): qty 100

## 2017-11-12 MED ORDER — EPHEDRINE SULFATE 50 MG/ML IJ SOLN
INTRAMUSCULAR | Status: DC | PRN
  Administered 2017-11-12: 5 mg via INTRAVENOUS

## 2017-11-12 MED ORDER — OXYCODONE HCL 5 MG PO TABS: 5.0000 mg | ORAL_TABLET | ORAL | Status: DC | PRN

## 2017-11-12 MED ORDER — ACETAMINOPHEN 325 MG PO TABS
325.0000 mg | ORAL_TABLET | ORAL | Status: DC | PRN
  Administered 2017-11-15: 650 mg via ORAL
  Filled 2017-11-12: qty 2

## 2017-11-12 MED ORDER — DOCUSATE SODIUM 100 MG PO CAPS
100.0000 mg | ORAL_CAPSULE | Freq: Every day | ORAL | Status: DC
  Administered 2017-11-13 – 2017-11-15 (×3): 100 mg via ORAL
  Filled 2017-11-12 (×3): qty 1

## 2017-11-12 MED ORDER — 0.9 % SODIUM CHLORIDE (POUR BTL) OPTIME
TOPICAL | Status: DC | PRN
  Administered 2017-11-12: 1000 mL

## 2017-11-12 MED ORDER — DOPAMINE-DEXTROSE 3.2-5 MG/ML-% IV SOLN: 10.0000 ug/kg/min | INTRAVENOUS | Status: DC

## 2017-11-12 MED ORDER — METOPROLOL SUCCINATE ER 50 MG PO TB24
50.0000 mg | ORAL_TABLET | Freq: Every day | ORAL | Status: DC
  Administered 2017-11-13: 50 mg via ORAL
  Filled 2017-11-12 (×2): qty 1

## 2017-11-12 MED ORDER — INSULIN ASPART 100 UNIT/ML ~~LOC~~ SOLN
0.0000 [IU] | Freq: Three times a day (TID) | SUBCUTANEOUS | Status: DC
  Administered 2017-11-13: 2 [IU] via SUBCUTANEOUS
  Administered 2017-11-13: 1 [IU] via SUBCUTANEOUS
  Administered 2017-11-14: 2 [IU] via SUBCUTANEOUS

## 2017-11-12 MED ORDER — METOPROLOL TARTRATE 5 MG/5ML IV SOLN
2.0000 mg | INTRAVENOUS | Status: DC | PRN
  Administered 2017-11-12: 2.5 mg via INTRAVENOUS

## 2017-11-12 MED ORDER — HEPARIN SODIUM (PORCINE) 1000 UNIT/ML IJ SOLN
INTRAMUSCULAR | Status: DC | PRN
  Administered 2017-11-12: 6000 [IU] via INTRAVENOUS

## 2017-11-12 MED ORDER — PROTAMINE SULFATE 10 MG/ML IV SOLN
INTRAVENOUS | Status: DC | PRN
  Administered 2017-11-12: 50 mg via INTRAVENOUS

## 2017-11-12 MED ORDER — PROMETHAZINE HCL 25 MG/ML IJ SOLN: 6.2500 mg | INTRAMUSCULAR | Status: DC | PRN

## 2017-11-12 MED ORDER — PANTOPRAZOLE SODIUM 40 MG PO TBEC
40.0000 mg | DELAYED_RELEASE_TABLET | Freq: Every day | ORAL | Status: DC
  Administered 2017-11-12 – 2017-11-15 (×4): 40 mg via ORAL
  Filled 2017-11-12 (×4): qty 1

## 2017-11-12 MED ORDER — HYDRALAZINE HCL 20 MG/ML IJ SOLN
5.0000 mg | INTRAMUSCULAR | Status: DC | PRN
  Administered 2017-11-13: 5 mg via INTRAVENOUS
  Filled 2017-11-12: qty 1

## 2017-11-12 SURGICAL SUPPLY — 76 items
ADH SKN CLS APL DERMABOND .7 (GAUZE/BANDAGES/DRESSINGS) ×1
BAG BANDED W/RUBBER/TAPE 36X54 (MISCELLANEOUS) ×6 IMPLANT
BAG EQP BAND 135X91 W/RBR TAPE (MISCELLANEOUS) ×2
BALLN STERLING RX 5X40X80 (BALLOONS) ×3
BALLOON STERLING RX 5X40X80 (BALLOONS) ×1 IMPLANT
CANISTER SUCT 3000ML PPV (MISCELLANEOUS) ×3 IMPLANT
CATH ANGIO 5F BER2 65CM (CATHETERS) ×3 IMPLANT
CATH ROBINSON RED A/P 18FR (CATHETERS) IMPLANT
CLIP VESOCCLUDE MED 24/CT (CLIP) ×3 IMPLANT
CLIP VESOCCLUDE SM WIDE 24/CT (CLIP) ×3 IMPLANT
COVER DOME SNAP 22 D (MISCELLANEOUS) ×3 IMPLANT
COVER PROBE W GEL 5X96 (DRAPES) ×3 IMPLANT
CRADLE DONUT ADULT HEAD (MISCELLANEOUS) ×3 IMPLANT
DERMABOND ADHESIVE PROPEN (GAUZE/BANDAGES/DRESSINGS) ×4
DERMABOND ADVANCED (GAUZE/BANDAGES/DRESSINGS) ×2
DERMABOND ADVANCED .7 DNX12 (GAUZE/BANDAGES/DRESSINGS) ×1 IMPLANT
DERMABOND ADVANCED .7 DNX6 (GAUZE/BANDAGES/DRESSINGS) ×2 IMPLANT
DEVICE TORQUE KENDALL .025-038 (MISCELLANEOUS) ×3 IMPLANT
DRAIN CHANNEL 15F RND FF W/TCR (WOUND CARE) IMPLANT
DRAPE INCISE IOBAN 85X60 (DRAPES) ×6 IMPLANT
DRAPE UNIVERSAL PACK (DRAPES) ×3 IMPLANT
ELECT REM PT RETURN 9FT ADLT (ELECTROSURGICAL) ×3
ELECTRODE REM PT RTRN 9FT ADLT (ELECTROSURGICAL) ×1 IMPLANT
EVACUATOR SILICONE 100CC (DRAIN) IMPLANT
GLOVE BIO SURGEON STRL SZ7.5 (GLOVE) ×3 IMPLANT
GOWN STRL REUS W/ TWL LRG LVL3 (GOWN DISPOSABLE) ×2 IMPLANT
GOWN STRL REUS W/ TWL XL LVL3 (GOWN DISPOSABLE) ×1 IMPLANT
GOWN STRL REUS W/TWL LRG LVL3 (GOWN DISPOSABLE) ×4
GOWN STRL REUS W/TWL XL LVL3 (GOWN DISPOSABLE) ×2
GUIDEWIRE ANGLED .035X150CM (WIRE) ×3 IMPLANT
GUIDEWIRE ENROUTE 0.014 (WIRE) ×3 IMPLANT
HEMOSTAT SNOW SURGICEL 2X4 (HEMOSTASIS) ×3 IMPLANT
INSERT FOGARTY SM (MISCELLANEOUS) IMPLANT
INTRODUCER KIT GALT 7CM (INTRODUCER) ×2
IV ADAPTER SYR DOUBLE MALE LL (MISCELLANEOUS) IMPLANT
KIT BASIN OR (CUSTOM PROCEDURE TRAY) ×3 IMPLANT
KIT ENCORE 26 ADVANTAGE (KITS) ×3 IMPLANT
KIT INTRODUCER GALT 7 (INTRODUCER) ×1 IMPLANT
KIT TURNOVER KIT B (KITS) ×3 IMPLANT
NEEDLE HYPO 25GX1X1/2 BEV (NEEDLE) IMPLANT
NEEDLE PERC 18GX7CM (NEEDLE) ×3 IMPLANT
NEEDLE SPNL 20GX3.5 QUINCKE YW (NEEDLE) IMPLANT
PACK CAROTID (CUSTOM PROCEDURE TRAY) ×3 IMPLANT
PACK UNIVERSAL I (CUSTOM PROCEDURE TRAY) ×3 IMPLANT
PAD ARMBOARD 7.5X6 YLW CONV (MISCELLANEOUS) ×6 IMPLANT
PROTECTION STATION PRESSURIZED (MISCELLANEOUS) ×3
SET MICROPUNCTURE 5F STIFF (MISCELLANEOUS) ×3 IMPLANT
SHEATH AVANTI 11CM 5FR (SHEATH) IMPLANT
SHUNT CAROTID BYPASS 10 (VASCULAR PRODUCTS) IMPLANT
SHUNT CAROTID BYPASS 12FRX15.5 (VASCULAR PRODUCTS) IMPLANT
STATION PROTECTION PRESSURIZED (MISCELLANEOUS) ×1 IMPLANT
STENT TRANSCAROTID SYS 7X40 (Permanent Stent) ×3 IMPLANT
STOPCOCK 4 WAY LG BORE MALE ST (IV SETS) ×3 IMPLANT
SUT ETHILON 3 0 PS 1 (SUTURE) IMPLANT
SUT MNCRL AB 4-0 PS2 18 (SUTURE) ×3 IMPLANT
SUT PROLENE 5 0 C 1 24 (SUTURE) ×3 IMPLANT
SUT PROLENE 6 0 BV (SUTURE) IMPLANT
SUT PROLENE 7 0 BV 1 (SUTURE) IMPLANT
SUT SILK 2 0 SH (SUTURE) ×3 IMPLANT
SUT SILK 2 0 SH CR/8 (SUTURE) ×3 IMPLANT
SUT SILK 3 0 (SUTURE)
SUT SILK 3-0 18XBRD TIE 12 (SUTURE) IMPLANT
SUT VIC AB 3-0 SH 27 (SUTURE) ×2
SUT VIC AB 3-0 SH 27X BRD (SUTURE) ×1 IMPLANT
SUT VIC AB 4-0 PS2 18 (SUTURE) ×3 IMPLANT
SUT VICRYL 4-0 PS2 18IN ABS (SUTURE) ×3 IMPLANT
SYR 10ML LL (SYRINGE) ×9 IMPLANT
SYR 20CC LL (SYRINGE) ×3 IMPLANT
SYR 5ML LL (SYRINGE) ×3 IMPLANT
SYR CONTROL 10ML LL (SYRINGE) IMPLANT
TOWEL GREEN STERILE (TOWEL DISPOSABLE) ×3 IMPLANT
TUBING ART PRESS 48 MALE/FEM (TUBING) IMPLANT
TUBING EXTENTION W/L.L. (IV SETS) IMPLANT
WATER STERILE IRR 1000ML POUR (IV SOLUTION) ×3 IMPLANT
WIRE AMPLATZ SS-J .035X180CM (WIRE) IMPLANT
WIRE BENTSON .035X145CM (WIRE) ×3 IMPLANT

## 2017-11-12 NOTE — Transfer of Care (Signed)
Immediate Anesthesia Transfer of Care Note  Patient: Jeffrey Kaiser  Procedure(s) Performed: Dorthula Perfect ARTERY REVASCULARIZATION (Right Neck)  Patient Location: PACU  Anesthesia Type:MAC  Level of Consciousness: awake, alert , oriented and patient cooperative  Airway & Oxygen Therapy: Patient Spontanous Breathing and Patient connected to nasal cannula oxygen  Post-op Assessment: Report given to RN, Post -op Vital signs reviewed and unstable, Anesthesiologist notified, Patient moving all extremities, Patient moving all extremities X 4 and Patient able to stick tongue midline  Post vital signs: Reviewed and unstable SBP 80-90s per aline and cuff.  Dr singer aware and gave orders for RN to give 281m albumin.  Dr cDonzetta Mattersat bedside and aware of pressure, states it is ok and he can order sudafed if BP continues to be low.  Patient awake and alert and oriented and states he feels fine.   Last Vitals:  Vitals Value Taken Time  BP 72/51 11/12/2017 11:31 AM  Temp    Pulse 91 11/12/2017 11:35 AM  Resp 12 11/12/2017 11:35 AM  SpO2 93 % 11/12/2017 11:35 AM  Vitals shown include unvalidated device data.  Last Pain:  Vitals:   11/12/17 0759  TempSrc:   PainSc: 0-No pain      Patients Stated Pain Goal: 3 [id number]  Complications: No apparent anesthesia complications

## 2017-11-12 NOTE — H&P (Signed)
   History and Physical Update  The patient was interviewed and re-examined.  The patient's previous History and Physical has been reviewed and is unchanged from recent office visit.  Symptomatic right carotid artery stenosis.  We will plan for right sided trans-carotid artery stenting in the OR today.  I discussed the risk and benefits with the patient and his present family and they agreed to proceed.  Vontae Court C. Donzetta Matters, MD [facility] Office: [phone] Pager: [phone]   11/12/2017, 9:08 AM

## 2017-11-12 NOTE — Telephone Encounter (Signed)
Sched. Appt. LVM (with pt. And daughter mobile) 12/11/17 11am Carotid 12pm p/o MD BCC 4wks s/p RT CAR

## 2017-11-12 NOTE — Anesthesia Preprocedure Evaluation (Addendum)
Anesthesia Evaluation  Patient identified by MRN, date of birth, ID band Patient awake    Reviewed: Allergy & Precautions, NPO status , Patient's Chart, lab work & pertinent test results, reviewed documented beta blocker date and time   Airway Mallampati: II  TM Distance: >3 FB Neck ROM: Limited    Dental  (+) Dental Advisory Given, Edentulous Upper, Edentulous Lower   Pulmonary neg pulmonary ROS,    Pulmonary exam normal        Cardiovascular hypertension, Pt. on medications and Pt. on home beta blockers + CAD  Normal cardiovascular exam  Study Conclusions  - Left ventricle: The cavity size was normal. There was mild   concentric hypertrophy. Systolic function was normal. The   estimated ejection fraction was in the range of 55% to 60%. Wall   motion was normal; there were no regional wall motion   abnormalities. Doppler parameters are consistent with abnormal   left ventricular relaxation (grade 1 diastolic dysfunction).   Doppler parameters are consistent with high ventricular filling   pressure. - Aortic valve: Valve mobility was restricted. Transvalvular   velocity was within the normal range. There was no stenosis.   There was no regurgitation. Valve area (VTI): 1.19 cm^2. Valve   area (Vmean): 1.06 cm^2. - Mitral valve: Transvalvular velocity was within the normal range.   There was no evidence for stenosis. There was trivial   regurgitation. - Left atrium: The atrium was moderately dilated. - Right ventricle: The cavity size was normal. Wall thickness was   normal. Systolic function was normal. - Tricuspid valve: There was no regurgitation.   Neuro/Psych TIAnegative psych ROS   GI/Hepatic negative GI ROS, Neg liver ROS,   Endo/Other  diabetes  Renal/GU negative Renal ROS  negative genitourinary   Musculoskeletal negative musculoskeletal ROS (+)   Abdominal   Peds negative pediatric ROS (+)   Hematology negative hematology ROS (+)   Anesthesia Other Findings   Reproductive/Obstetrics negative OB ROS                            Anesthesia Physical Anesthesia Plan  ASA: III  Anesthesia Plan: MAC   Post-op Pain Management:    Induction: Intravenous  PONV Risk Score and Plan: 3 and Ondansetron, Dexamethasone, Diphenhydramine and Treatment may vary due to age or medical condition  Airway Management Planned: Natural Airway  Additional Equipment:   Intra-op Plan:   Post-operative Plan:   Informed Consent: I have reviewed the patients History and Physical, chart, labs and discussed the procedure including the risks, benefits and alternatives for the proposed anesthesia with the patient or authorized representative who has indicated his/her understanding and acceptance.   Dental advisory given  Plan Discussed with: CRNA and Anesthesiologist  Anesthesia Plan Comments:       Anesthesia Quick Evaluation

## 2017-11-12 NOTE — Progress Notes (Signed)
Per Dr. Donzetta Matters, goal ABP is 120-160 per cuff.

## 2017-11-12 NOTE — Op Note (Signed)
Patient name: Jeffrey Kaiser MRN: [id number] DOB: 08-Feb-1926 Sex: male  11/12/2017 Pre-operative Diagnosis: Symptomatic right carotid artery stenosis Post-operative diagnosis:  Same Surgeon:  Erlene Quan C. Donzetta Matters, MD Assistant: Adele Barthel, MD Procedure Performed: Right trans-carotid artery stent with 7 x 40 mm stent.  Indications: 84 year old male with history of right brain TIA.  He has high-grade stenosis of the right internal carotid artery now indicated for intervention.  Findings: There is very high-grade stenosis of his internal carotid artery at the takeoff.  I completion stenosis is resolved there is no dissection.  There is may be some disease at the distal extent of our stent but there is no flow limitation.   Procedure:  The patient was identified in the holding area and taken to the operating room where is placed supine on operating table.  He was sterilely prepped and draped in his right neck and bilateral groin areas and timeout was called.  No sedation was administered but anesthesia was monitoring the patient throughout.  We used 1% lidocaine in both the right neck and groin area.  His ultrasound to identify first the right common carotid artery and this was dissected down onto we placed a vessel loop and umbilical tape around it.  Patient at this time was given full dose heparin and ACT returned greater than 250.  Concomitantly Dr. Bridgett Larsson he is ultrasound to identify the left common femoral vein cannulated this with a micropuncture needle followed by wire and then placed a 8 French sheath.  After ACT had returned we then placed a 5-0 Prolene figure-of-eight suture in the common carotid artery.  This was cannulated with micropuncture needle we placed the wire to 3 cm and then placed our sheath 3 cm.  Angiogram demonstrated the area of our bifurcation of our common carotid artery.  We then placed a stiff wire just below the bifurcation and placed our T car sheath.  This was hooked up to  passive reversal of flow and this was confirmed.  We then took 2 separate injected shots and determined that our sheath was in good position and that our best view was an RAO projection.  We then placed a 5 x 40 balloon through the sheath with a wire.  Common carotid artery was clamped.  We were able to easily traverse into the internal carotid artery high into the skull base and then ballooned to nominal pressure at the level of our lesion.  The patient was able to squeak a squeaky toy throughout this time so we knew he was tolerating clamping well.  We then exchanged for a 7 x 40 stent and this was deployed at the same area.  We waited 2 minutes and then performed 2 views which demonstrated patency of our stent with good flow distally although there was possibly some disease at the distal aspect of the stent.  With this we then removed our wire unclamped return the blood via the groin sheath.  Groin sheath was removed pressure held.  We then removed our right neck sheath and 5-0 Prolene suture was cinched down.  50 mg of protamine was administered he tolerated well.  We then irrigated the wound and closed in 2 layers with Vicryl Monocryl.  Dermabond was placed to level the skin.  Drapes were removed he is moving all 4 extremities without any limitation.  He was transferred to recovery room in stable condition.  All counts were correct at completion.  Next  EBL 25 cc.  Alonso Gapinski C. Donzetta Matters, MD [facility] Office: [phone] Pager: [phone]

## 2017-11-12 NOTE — Anesthesia Procedure Notes (Signed)
Arterial Line Insertion Performed by: Eligha Bridegroom, CRNA, CRNA  Lidocaine 1% used for infiltration Left, radial was placed Catheter size: 20 G Hand hygiene performed  and maximum sterile barriers used   Attempts: 1 Procedure performed without using ultrasound guided technique. Following insertion, dressing applied and Biopatch. Patient tolerated the procedure well with no immediate complications.

## 2017-11-12 NOTE — Anesthesia Postprocedure Evaluation (Signed)
Anesthesia Post Note  Patient: Jeffrey Kaiser  Procedure(s) Performed: Dorthula Perfect ARTERY REVASCULARIZATION (Right Neck)     Patient location during evaluation: PACU Anesthesia Type: MAC Level of consciousness: awake and alert Pain management: pain level controlled Vital Signs Assessment: post-procedure vital signs reviewed and stable Respiratory status: spontaneous breathing, nonlabored ventilation, respiratory function stable and patient connected to nasal cannula oxygen Cardiovascular status: stable and blood pressure returned to baseline Postop Assessment: no apparent nausea or vomiting Anesthetic complications: no    Last Vitals:  Vitals:   11/12/17 1400 11/12/17 1415  BP: (!) 97/50 (!) 128/57  Pulse: 72 73  Resp: (!) 24 17  Temp:    SpO2: 97% 99%    Last Pain:  Vitals:   11/12/17 1200  TempSrc:   PainSc: 0-No pain                 Dammon Makarewicz DANIEL

## 2017-11-13 ENCOUNTER — Encounter (HOSPITAL_COMMUNITY): Payer: Self-pay | Admitting: Vascular Surgery

## 2017-11-13 LAB — BASIC METABOLIC PANEL
ANION GAP: 15 (ref 5–15)
BUN: 16 mg/dL (ref 8–23)
CHLORIDE: 103 mmol/L (ref 98–111)
CO2: 25 mmol/L (ref 22–32)
Calcium: 8.9 mg/dL (ref 8.9–10.3)
Creatinine, Ser: 0.95 mg/dL (ref 0.61–1.24)
GFR calc Af Amer: >60 mL/min (ref >60)
GLUCOSE: 148 mg/dL — AB (ref 70–99)
POTASSIUM: 4 mmol/L (ref 3.5–5.1)
Sodium: 143 mmol/L (ref 135–145)

## 2017-11-13 LAB — CBC
HEMATOCRIT: 32.6 % — AB (ref 39.0–52.0)
HEMOGLOBIN: 10.1 g/dL — AB (ref 13.0–17.0)
MCH: 26.8 pg (ref 26.0–34.0)
MCHC: 31 g/dL (ref 30.0–36.0)
MCV: 86.5 fL (ref 78.0–100.0)
Platelets: 167 10*3/uL (ref 150–400)
RBC: 3.77 MIL/uL — AB (ref 4.22–5.81)
RDW: 14.2 % (ref 11.5–15.5)
WBC: 6.8 10*3/uL (ref 4.0–10.5)

## 2017-11-13 LAB — GLUCOSE, CAPILLARY
GLUCOSE-CAPILLARY: 142 mg/dL — AB (ref 70–99)
Glucose-Capillary: 118 mg/dL — ABNORMAL HIGH (ref 70–99)
Glucose-Capillary: 151 mg/dL — ABNORMAL HIGH (ref 70–99)
Glucose-Capillary: 174 mg/dL — ABNORMAL HIGH (ref 70–99)

## 2017-11-13 NOTE — Progress Notes (Signed)
Pt family asking about how they can obtain a hospital bed for when pt d/c's home. Will pass along to case manager tomorrow morning.

## 2017-11-13 NOTE — Plan of Care (Signed)
  Problem: Clinical Measurements: Goal: Ability to maintain clinical measurements within normal limits will improve Outcome: Progressing Goal: Cardiovascular complication will be avoided Outcome: Progressing  Vital signs labile but stable with Dopamine infusion Problem: Elimination: Goal: Will not experience complications related to urinary retention Outcome: Progressing  Voiding in urinal Problem: Pain Managment: Goal: General experience of comfort will improve Outcome: Completed/Met  Pt reports being free of pain Problem: Skin Integrity: Goal: Risk for impaired skin integrity will decrease Outcome: Progressing

## 2017-11-13 NOTE — Progress Notes (Signed)
  Progress Note    11/13/2017 8:40 AM 1 Day Post-Op  Subjective: Feeling fine without acute issues  Vitals:   11/13/17 0630 11/13/17 0700  BP: (!) 131/51   Pulse: 85   Resp: 17   Temp:  98 F (36.7 C)  SpO2: 100%     Physical Exam: Awake alert oriented Cranial nerves intact Moving all 4 extremities equally Hematoma at right neck incision Mild hematoma left groin  CBC    Component Value Date/Time   WBC 6.8 11/13/2017 0241   RBC 3.77 (L) 11/13/2017 0241   HGB 10.1 (L) 11/13/2017 0241   HCT 32.6 (L) 11/13/2017 0241   PLT 167 11/13/2017 0241   MCV 86.5 11/13/2017 0241   MCH 26.8 11/13/2017 0241   MCHC 31.0 11/13/2017 0241   RDW 14.2 11/13/2017 0241   LYMPHSABS 0.8 10/20/2017 1209   MONOABS 0.5 10/20/2017 1209   EOSABS 0.0 10/20/2017 1209   BASOSABS 0.0 10/20/2017 1209    BMET    Component Value Date/Time   NA 143 11/13/2017 0241   K 4.0 11/13/2017 0241   CL 103 11/13/2017 0241   CO2 25 11/13/2017 0241   GLUCOSE 148 (H) 11/13/2017 0241   BUN 16 11/13/2017 0241   CREATININE 0.95 11/13/2017 0241   CALCIUM 8.9 11/13/2017 0241   GFRNONAA >60 11/13/2017 0241   GFRAA >60 11/13/2017 0241    INR    Component Value Date/Time   INR 1.05 11/12/2017 0738     Intake/Output Summary (Last 24 hours) at 11/13/2017 0840 Last data filed at 11/13/2017 0700 Gross per 24 hour  Intake 2052.15 ml  Output 1400 ml  Net 652.15 ml     Assessment:  84 y.o. male is s/p right trans-carotid artery stenting for symptomatic disease.  Hematoma is stable.  He has been on and off dopamine.  Plan: We will need to continue dopamine, he is on pseudoephedrine We will continue ICU care until we can transition off of dopamine.   Brandon C. Donzetta Matters, MD [facility] Office: [phone] Pager: [phone]  11/13/2017 8:40 AM

## 2017-11-13 NOTE — Progress Notes (Signed)
Lying, Sitting, and Standing Blood Pressures on 12mcg/kg/min of Dopamine

## 2017-11-14 LAB — BASIC METABOLIC PANEL
Anion gap: 6 (ref 5–15)
BUN: 14 mg/dL (ref 8–23)
CALCIUM: 8.6 mg/dL — AB (ref 8.9–10.3)
CO2: 27 mmol/L (ref 22–32)
Chloride: 107 mmol/L (ref 98–111)
Creatinine, Ser: 0.76 mg/dL (ref 0.61–1.24)
GFR calc non Af Amer: >60 mL/min (ref >60)
Glucose, Bld: 125 mg/dL — ABNORMAL HIGH (ref 70–99)
Potassium: 3.8 mmol/L (ref 3.5–5.1)
SODIUM: 140 mmol/L (ref 135–145)

## 2017-11-14 LAB — CBC
HEMATOCRIT: 29.2 % — AB (ref 39.0–52.0)
Hemoglobin: 9.2 g/dL — ABNORMAL LOW (ref 13.0–17.0)
MCH: 27.3 pg (ref 26.0–34.0)
MCHC: 31.5 g/dL (ref 30.0–36.0)
MCV: 86.6 fL (ref 78.0–100.0)
Platelets: 138 10*3/uL — ABNORMAL LOW (ref 150–400)
RBC: 3.37 MIL/uL — ABNORMAL LOW (ref 4.22–5.81)
RDW: 14.2 % (ref 11.5–15.5)
WBC: 6.7 10*3/uL (ref 4.0–10.5)

## 2017-11-14 LAB — GLUCOSE, CAPILLARY
GLUCOSE-CAPILLARY: 123 mg/dL — AB (ref 70–99)
GLUCOSE-CAPILLARY: 131 mg/dL — AB (ref 70–99)

## 2017-11-14 NOTE — Plan of Care (Signed)
  Problem: Clinical Measurements: Goal: Ability to maintain clinical measurements within normal limits will improve Outcome: Progressing Goal: Cardiovascular complication will be avoided Outcome: Progressing  BP controlled with Dopamine infusion Problem: Nutrition: Goal: Adequate nutrition will be maintained Outcome: Progressing  Problem: Elimination: Goal: Will not experience complications related to bowel motility Outcome: Progressing  Voiding well into urinal, good UO, clear/yellow Problem: Safety: Goal: Ability to remain free from injury will improve Outcome: Progressing   Problem: Skin Integrity: Goal: Risk for impaired skin integrity will decrease Outcome: Progressing

## 2017-11-14 NOTE — Progress Notes (Signed)
Patient ID: Jeffrey Kaiser, male   DOB: May 26, 1925, 84 y.o.   MRN: [id number]                                     Patient name: LONNY EISEN MRN: [id number] DOB: July 13, 1925 Sex: male    HPI: RAED SCHALK is a 84 y.o. male postop day 2 fromTCAR.  Alert oriented comfortable speaking with his family present.  Mild discomfort in his neck.  Current Facility-Administered Medications  Medication Dose Route Frequency Provider Last Rate Last Dose  . 0.9 %  sodium chloride infusion  500 mL Intravenous Once PRN Rhyne, Samantha J, PA-C      . 0.9 %  sodium chloride infusion   Intravenous Continuous Rhyne, Samantha J, PA-C 75 mL/hr at 11/14/17 0800    . acetaminophen (TYLENOL) tablet 325-650 mg  325-650 mg Oral Q4H PRN Rhyne, Samantha J, PA-C       Or  . acetaminophen (TYLENOL) suppository 325-650 mg  325-650 mg Rectal Q4H PRN Rhyne, Samantha J, PA-C      . alum & mag hydroxide-simeth (MAALOX/MYLANTA) 200-200-20 MG/5ML suspension 15-30 mL  15-30 mL Oral Q2H PRN Rhyne, Samantha J, PA-C   15 mL at 11/12/17 2235  . aspirin EC tablet 81 mg  81 mg Oral Daily Gabriel Earing, PA-C   81 mg at 11/14/17 9924  . atorvastatin (LIPITOR) tablet 20 mg  20 mg Oral Daily Rhyne, Samantha J, PA-C   20 mg at 11/14/17 0906  . bisacodyl (DULCOLAX) suppository 10 mg  10 mg Rectal Daily PRN Rhyne, Samantha J, PA-C      . bromocriptine (PARLODEL) tablet 1.25 mg  1.25 mg Oral QHS Rhyne, Samantha J, PA-C   1.25 mg at 11/13/17 2049  . Chlorhexidine Gluconate Cloth 2 % PADS 6 each  6 each Topical Daily Waynetta Sandy, MD   6 each at 11/13/17 1010  . cilostazol (PLETAL) tablet 100 mg  100 mg Oral BID Gabriel Earing, PA-C   100 mg at 11/14/17 0906  . clopidogrel (PLAVIX) tablet 75 mg  75 mg Oral Daily Gabriel Earing, PA-C   75 mg at 11/14/17 0906  . docusate sodium (COLACE) capsule 100 mg  100 mg Oral Daily Rhyne, Samantha J, PA-C   100 mg at 11/14/17 0906  . DOPamine (INTROPIN) 800 mg in dextrose 5 % 250 mL (3.2  mg/mL) infusion  0-10 mcg/kg/min Intravenous Titrated Waynetta Sandy, MD 6.1 mL/hr at 11/14/17 0800 5 mcg/kg/min at 11/14/17 0800  . guaiFENesin-dextromethorphan (ROBITUSSIN DM) 100-10 MG/5ML syrup 15 mL  15 mL Oral Q4H PRN Rhyne, Samantha J, PA-C      . hydrALAZINE (APRESOLINE) injection 5 mg  5 mg Intravenous Q20 Min PRN Rhyne, Samantha J, PA-C   5 mg at 11/13/17 0512  . insulin aspart (novoLOG) injection 0-9 Units  0-9 Units Subcutaneous TID WC Rhyne, Samantha J, PA-C   2 Units at 11/14/17 0751  . isosorbide mononitrate (IMDUR) 24 hr tablet 30 mg  30 mg Oral Daily Rhyne, Samantha J, PA-C   30 mg at 11/14/17 0906  . labetalol (NORMODYNE,TRANDATE) injection 10 mg  10 mg Intravenous Q10 min PRN Rhyne, Samantha J, PA-C      . latanoprost (XALATAN) 0.005 % ophthalmic solution 1 drop  1 drop Both Eyes QHS Rhyne, Samantha J, PA-C   1 drop at 11/13/17 2048  . magnesium  sulfate IVPB 2 g 50 mL  2 g Intravenous Daily PRN Rhyne, Samantha J, PA-C      . metoprolol succinate (TOPROL-XL) 24 hr tablet 50 mg  50 mg Oral Daily Rhyne, Samantha J, PA-C   50 mg at 11/13/17 1027  . metoprolol tartrate (LOPRESSOR) injection 2-5 mg  2-5 mg Intravenous Q2H PRN Rhyne, Samantha J, PA-C   2.5 mg at 11/12/17 1438  . morphine 2 MG/ML injection 2 mg  2 mg Intravenous Q2H PRN Rhyne, Samantha J, PA-C      . mupirocin ointment (BACTROBAN) 2 % 1 application  1 application Nasal BID Waynetta Sandy, MD   1 application at 38/45/36 [phone]  . ondansetron (ZOFRAN) injection 4 mg  4 mg Intravenous Q6H PRN Rhyne, Samantha J, PA-C   4 mg at 11/13/17 0215  . oxyCODONE (Oxy IR/ROXICODONE) immediate release tablet 5-10 mg  5-10 mg Oral Q4H PRN Rhyne, Samantha J, PA-C      . pantoprazole (PROTONIX) EC tablet 40 mg  40 mg Oral Daily Rhyne, Samantha J, PA-C   40 mg at 11/14/17 0906  . phenol (CHLORASEPTIC) mouth spray 1 spray  1 spray Mouth/Throat PRN Rhyne, Samantha J, PA-C      . polyethylene glycol (MIRALAX / GLYCOLAX)  packet 17 g  17 g Oral Daily PRN Rhyne, Samantha J, PA-C      . potassium chloride SA (K-DUR,KLOR-CON) CR tablet 20-40 mEq  20-40 mEq Oral Daily PRN Rhyne, Samantha J, PA-C      . pseudoephedrine (SUDAFED) tablet 30 mg  30 mg Oral Q6H Waynetta Sandy, MD   30 mg at 11/14/17 0636  . repaglinide (PRANDIN) tablet 1 mg  1 mg Oral TID AC Waynetta Sandy, MD   1 mg at 11/14/17 0731  . tiZANidine (ZANAFLEX) tablet 2 mg  2 mg Oral Q6H PRN Gabriel Earing, PA-C         PHYSICAL EXAM: Vitals:   11/14/17 0638 11/14/17 0700 11/14/17 0731 11/14/17 0800  BP: (!) 135/58 (!) 134/55  (!) 169/141  Pulse: 80 77  69  Resp: (!) 29 16  16   Temp:   98.1 F (36.7 C)   TempSrc:   Oral   SpO2: 93% 96%  96%  Weight:      Height:        GENERAL: The patient is a well-nourished male, in no acute distress. The vital signs are documented above. Neurologically intact.  Mild hematoma at the base of his right neck and mild bruising in his left groin which are stable.  MEDICAL ISSUES: Stable overall.  Continued on low-dose 4 mcg/kg/min dopamine.  Will wean and discontinue this.  Plan discharge in a.m.   Rosetta Posner, MD FACS [facility] Tel [phone] Pager [phone]

## 2017-11-15 LAB — GLUCOSE, CAPILLARY
GLUCOSE-CAPILLARY: 60 mg/dL — AB (ref 70–99)
GLUCOSE-CAPILLARY: 77 mg/dL (ref 70–99)
Glucose-Capillary: 99 mg/dL (ref 70–99)
Glucose-Capillary: 106 mg/dL — ABNORMAL HIGH (ref 70–99)

## 2017-11-15 NOTE — Progress Notes (Addendum)
  Progress Note    11/15/2017 8:59 AM 3 Days Post-Op  Subjective:  Sitting in chair; no complaints  Afebrile HR 70's-90's NSR 64'Q-034'V systolic 42% RA  Vitals:   11/15/17 0721 11/15/17 0800  BP: (!) 98/46 111/66  Pulse: 89 92  Resp: (!) 21 20  Temp:  97.7 F (36.5 C)  SpO2: 97% 100%     Physical Exam: Neuro:  In tact; moving all extremities equally Lungs:  Non labored Incision:  Clean and dry with mild fullness; ecchymosis present  CBC    Component Value Date/Time   WBC 6.7 11/14/2017 0400   RBC 3.37 (L) 11/14/2017 0400   HGB 9.2 (L) 11/14/2017 0400   HCT 29.2 (L) 11/14/2017 0400   PLT 138 (L) 11/14/2017 0400   MCV 86.6 11/14/2017 0400   MCH 27.3 11/14/2017 0400   MCHC 31.5 11/14/2017 0400   RDW 14.2 11/14/2017 0400   LYMPHSABS 0.8 10/20/2017 1209   MONOABS 0.5 10/20/2017 1209   EOSABS 0.0 10/20/2017 1209   BASOSABS 0.0 10/20/2017 1209    BMET    Component Value Date/Time   NA 140 11/14/2017 0400   K 3.8 11/14/2017 0400   CL 107 11/14/2017 0400   CO2 27 11/14/2017 0400   GLUCOSE 125 (H) 11/14/2017 0400   BUN 14 11/14/2017 0400   CREATININE 0.76 11/14/2017 0400   CALCIUM 8.6 (L) 11/14/2017 0400   GFRNONAA >60 11/14/2017 0400   GFRAA >60 11/14/2017 0400     Intake/Output Summary (Last 24 hours) at 11/15/2017 0859 Last data filed at 11/15/2017 0800 Gross per 24 hour  Intake 2212.96 ml  Output 550 ml  Net 1662.96 ml     Assessment/Plan:  This is a 84 y.o. male who is s/p right TCAR 3 Days Post-Op  -pt is doing well this am.  His systolic BP this am is 595'G-LOVFIEPPIR held yesterday-will hold again today.  -hypoglycemia this am with glucose of 60 but rebounded nicely with treatment -remove a-line (removed this am) -pt neuro exam is in tact -pt has ambulated -pt has voided -f/u with Dr. Donzetta Matters in 4 weeks with duplex   Leontine Locket, PA-C [facility] [phone]   I have examined the patient, reviewed and agree with  above.  Stable for discharge home.  The patient has his bedroom requiring 2 flights of stairs.  Do not feel that he is quite stable for this.  Is walking in the halls without assistance.  Recommend hospital bed for several weeks until he recovers fully from surgery.  Will arrange through case management.  Pt requires frequent repositioning and elevation of head in degrees.  Pt needs bed to be able to be repositioned immediately to assist in optimal respiration and feedings   Also some instability from deconditioning.  Will need rolling walker for mobility Curt Jews, MD 11/15/2017 11:25 AM

## 2017-11-15 NOTE — Care Management (Addendum)
Pt's family requesting hospital bed.  Will need a signed order for DME hospital bed and a progress note stating patient's need for a hospital bed.   Pt requires frequent repositioning and elevation of head in degrees.  Pt's bed needs to be able to repositioned immediately to assist in optimal respiration and feedings.    11:44-  Referral given to [facility] with [facility].  Bed can be delivered today to the home.  Gilford Rile will be delivered to room prior to d/c.

## 2017-11-15 NOTE — Progress Notes (Signed)
Discharge instructions (including medications) discussed with and copy provided to patient/caregiver 

## 2017-11-15 NOTE — Discharge Instructions (Signed)
[facility]  Discharge Instructions   Carotid Surgery  Please refer to the following instructions for your post-procedure care. Your surgeon or physician assistant will discuss any changes with you.  Activity  You are encouraged to walk as much as you can. You can slowly return to normal activities but must avoid strenuous activity and heavy lifting until your doctor tell you it's okay. Avoid activities such as vacuuming or swinging a golf club. You can drive after one week if you are comfortable and you are no longer taking prescription pain medications. It is normal to feel tired for serval weeks after your surgery. It is also normal to have difficulty with sleep habits, eating, and bowel movements after surgery. These will go away with time.  Bathing/Showering  Shower daily after you go home. Do not soak in a bathtub, hot tub, or swim until the incision heals completely.  Incision Care  Shower every day. Clean your incision with mild soap and water. Pat the area dry with a clean towel. You do not need a bandage unless otherwise instructed. Do not apply any ointments or creams to your incision. You may have skin glue on your incision. Do not peel it off. It will come off on its own in about one week. Your incision may feel thickened and raised for several weeks after your surgery. This is normal and the skin will soften over time.   For Men Only: It's okay to shave around the incision but do not shave the incision itself for 2 weeks. It is common to have numbness under your chin that could last for several months.  Diet  Resume your normal diet. There are no special food restrictions following this procedure. A low fat/low cholesterol diet is recommended for all patients with vascular disease. In order to heal from your surgery, it is CRITICAL to get adequate nutrition. Your body requires vitamins, minerals, and protein. Vegetables are the best source of  vitamins and minerals. Vegetables also provide the perfect balance of protein. Processed food has little nutritional value, so try to avoid this.  Medications  Resume taking all of your medications unless your doctor or physician assistant tells you not to. If your incision is causing pain, you may take over-the- counter pain relievers such as acetaminophen (Tylenol). If you were prescribed a stronger pain medication, please be aware these medications can cause nausea and constipation. Prevent nausea by taking the medication with a snack or meal. Avoid constipation by drinking plenty of fluids and eating foods with a high amount of fiber, such as fruits, vegetables, and grains.  Do not take Tylenol if you are taking prescription pain medications.  Hold your metoprolol until your systolic (top number of blood pressure) is higher than 120.  Continue to monitor your glucose at home.  Restart metformin on 11/16/17.  Follow Up  Our office will schedule a follow up appointment 2-3 weeks following discharge.  Please call us immediately for any of the following conditions   Increased pain, redness, drainage (pus) from your incision site.  Fever of 101 degrees or higher.  If you should develop stroke (slurred speech, difficulty swallowing, weakness on one side of your body, loss of vision) you should call 911 and go to the nearest emergency room.   Reduce your risk of vascular disease:   Stop smoking. If you would like help call [facility] at 1-800-QUIT-NOW 631-631-0057) or [place] at ([phone].  Manage your cholesterol  Maintain  a desired weight  Control your diabetes  Keep your blood pressure down   If you have any questions, please call the office at [phone].

## 2017-11-15 NOTE — Discharge Summary (Signed)
Discharge Summary     Jeffrey Kaiser [birth year] 84 y.o. male  [id number]  Admission Date: 11/12/2017  Discharge Date: 11/15/17  Physician: Thomes Lolling*  Admission Diagnosis: Carotid artery stenosis [I65.29]   HPI:   This is a 84 y.o. male recently evaluated hospital with left facial droop.  He was not given TPA given that the symptoms had resolved.  He had an MRI that was negative for stroke and was discharged home on aspirin and statin.  He now follows up for evaluation.  He has had a CT angios demonstrating high-grade stenosis bilaterally.  He has had not had any recurrent symptoms.  He is followed by Dr. Fletcher Anon for peripheral vascular disease.  Hospital Course:  The patient was admitted to the hospital and taken to the operating room on 11/12/2017 and underwent right TCAR.    There is very high-grade stenosis of his internal carotid artery at the takeoff.  I completion stenosis is resolved there is no dissection.  There is may be some disease at the distal extent of our stent but there is no flow limitation.  The pt tolerated the procedure well and was transported to the PACU in good condition.   By POD 1, the pt neuro status was in tact.  He was requiring dopamine for BP support.  This was weaned by POD 2 and he is discharged home.  His BP is soft and he is instructed to hold his metoprolol until systolic pressure is >102.  He will also resume his Metformin on 11/16/17 if glucose permits-discussed with pt.   The remainder of the hospital course consisted of increasing mobilization and increasing intake of solids without difficulty.   Recent Labs    11/13/17 0241 11/14/17 0400  NA 143 140  K 4.0 3.8  CL 103 107  CO2 25 27  GLUCOSE 148* 125*  BUN 16 14  CALCIUM 8.9 8.6*   Recent Labs    11/13/17 0241 11/14/17 0400  WBC 6.8 6.7  HGB 10.1* 9.2*  HCT 32.6* 29.2*  PLT 167 138*   No results for input(s): INR in the last 72 hours.     Discharge  Diagnosis:  Carotid artery stenosis [I65.29]  Secondary Diagnosis: Patient Active Problem List   Diagnosis Date Noted  . Carotid artery stenosis, symptomatic, right 11/12/2017  . Carotid artery stenosis 11/12/2017  . TIA (transient ischemic attack) 10/20/2017  . Neck pain 08/27/2017  . Diabetes ([place]) 06/13/2015  . Tinea cruris 10/31/2014  . Phimosis 10/05/2014  . Leg pain, bilateral 10/05/2014  . Rash and nonspecific skin eruption 01/30/2014  . Calf pain 01/30/2014  . Acute neck pain 10/26/2013  . RUQ abdominal pain 04/26/2013  . Neck pain, bilateral posterior 12/03/2012  . Left inguinal hernia 02/03/2012  . Right shoulder pain 11/20/2010  . BPH (benign prostatic hyperplasia) 09/09/2010  . CAD (coronary artery disease)   . History of colonic diverticulitis 09/04/2010  . Preventative health care 09/04/2010  . Anemia 09/04/2010  . DDD (degenerative disc disease), cervical 09/04/2010  . Diastolic dysfunction 58/52/7782  . Fatigue 09/04/2010  . Psoriasis 09/04/2010  . Arthritis   . Glaucoma   . Hyperlipidemia   . Hypertension    Past Medical History:  Diagnosis Date  . Anemia 09/04/2010  . Arthritis   . BPH (benign prostatic hyperplasia) 09/09/2010  . CAD (coronary artery disease)   . DDD (degenerative disc disease), cervical 09/04/2010  . Diabetes mellitus   . Diastolic dysfunction 07/29/5359  . Diverticulitis   .  Glaucoma   . Hernia   . History of colonic diverticulitis 09/04/2010  . Hyperlipidemia   . Hypertension   . Psoriasis 09/04/2010    Allergies as of 11/15/2017      Reactions   Levaquin [levofloxacin In D5w] Nausea Only   Tramadol Other (See Comments)   somnolence      Medication List    TAKE these medications   acetaminophen 500 MG tablet Commonly known as:  TYLENOL Take 500 mg by mouth every 6 (six) hours as needed for mild pain.   aspirin EC 81 MG tablet Take 81 mg by mouth daily.   atorvastatin 20 MG tablet Commonly known as:  LIPITOR TAKE 1  TABLET (20 MG TOTAL) BY MOUTH DAILY.   bromocriptine 2.5 MG tablet Commonly known as:  PARLODEL TAKE 1/2 TABLET AT BEDTIME   ciclopirox 0.77 % cream Commonly known as:  LOPROX Apply 1 application topically daily.   cilostazol 100 MG tablet Commonly known as:  PLETAL TAKE 1 TABLET BY MOUTH TWICE A DAY   clobetasol 0.05 % external solution Commonly known as:  TEMOVATE Apply topically 2 (two) times daily.   clopidogrel 75 MG tablet Commonly known as:  PLAVIX Take 1 tablet (75 mg total) by mouth daily.   glucose blood test strip 1 each by Other route daily. And lancets 1/day 250.00   isosorbide mononitrate 60 MG 24 hr tablet Commonly known as:  IMDUR Take 0.5 tablets (30 mg total) by mouth daily. Must keep scheduled appt for future refills   metFORMIN 500 MG 24 hr tablet Commonly known as:  GLUCOPHAGE-XR Take 2 tablets (1,000 mg total) by mouth 2 (two) times daily.   metoprolol succinate 50 MG 24 hr tablet Commonly known as:  TOPROL-XL TAKE 1 TABLET BY MOUTH DAILY WITH OR IMMEDIATELY FOLLOWING A MEAL   repaglinide 1 MG tablet Commonly known as:  PRANDIN Take 1 tablet (1 mg total) by mouth 3 (three) times daily before meals.   tiZANidine 2 MG tablet Commonly known as:  ZANAFLEX Take 1 tablet (2 mg total) by mouth every 6 (six) hours as needed for muscle spasms.   TRAVATAN Z 0.004 % Soln ophthalmic solution Generic drug:  Travoprost (BAK Free) Place 1 drop into both eyes at bedtime.        [facility] Discharge Instructions Carotid Endarterectomy (CEA)  Please refer to the following instructions for your post-procedure care. Your surgeon or physician assistant will discuss any changes with you.  Activity  You are encouraged to walk as much as you can. You can slowly return to normal activities but must avoid strenuous activity and heavy lifting until your doctor tell you it's OK. Avoid activities such as vacuuming or swinging a  golf club. You can drive after one week if you are comfortable and you are no longer taking prescription pain medications. It is normal to feel tired for serval weeks after your surgery. It is also normal to have difficulty with sleep habits, eating, and bowel movements after surgery. These will go away with time.  Bathing/Showering  You may shower after you come home. Do not soak in a bathtub, hot tub, or swim until the incision heals completely.  Incision Care  Shower every day. Clean your incision with mild soap and water. Pat the area dry with a clean towel. You do not need a bandage unless otherwise instructed. Do not apply any ointments or creams to your incision. You may have skin glue  on your incision. Do not peel it off. It will come off on its own in about one week. Your incision may feel thickened and raised for several weeks after your surgery. This is normal and the skin will soften over time. For Men Only: It's OK to shave around the incision but do not shave the incision itself for 2 weeks. It is common to have numbness under your chin that could last for several months.  Diet  Resume your normal diet. There are no special food restrictions following this procedure. A low fat/low cholesterol diet is recommended for all patients with vascular disease. In order to heal from your surgery, it is CRITICAL to get adequate nutrition. Your body requires vitamins, minerals, and protein. Vegetables are the best source of vitamins and minerals. Vegetables also provide the perfect balance of protein. Processed food has little nutritional value, so try to avoid this.  Medications  Resume taking all of your medications unless your doctor or physician assistant tells you not to.  If your incision is causing pain, you may take over-the- counter pain relievers such as acetaminophen (Tylenol). If you were prescribed a stronger pain medication, please be aware these medications can cause nausea and  constipation.  Prevent nausea by taking the medication with a snack or meal. Avoid constipation by drinking plenty of fluids and eating foods with a high amount of fiber, such as fruits, vegetables, and grains. Do not take Tylenol if you are taking prescription pain medications.  Follow Up  Our office will schedule a follow up appointment 2-3 weeks following discharge.  Please call us immediately for any of the following conditions  . Increased pain, redness, drainage (pus) from your incision site. . Fever of 101 degrees or higher. . If you should develop stroke (slurred speech, difficulty swallowing, weakness on one side of your body, loss of vision) you should call 911 and go to the nearest emergency room. .  Reduce your risk of vascular disease:  . Stop smoking. If you would like help call [facility] at 1-800-QUIT-NOW 303 600 1828) or [place] at (440) 716-9225. . Manage your cholesterol . Maintain a desired weight . Control your diabetes . Keep your blood pressure down .  If you have any questions, please call the office at [phone].  Prescriptions given: None (taking Tylenol for pain)  Disposition: home  Patient's condition: is Good  Follow up: 1. Dr. Donzetta Matters in 4 weeks with duplex   Leontine Locket, PA-C [facility] [phone]   --- For [facility] use ---   Modified Rankin score at D/C (0-6): 0  IV medication needed for:  1. Hypertension: No 2. Hypotension: Yes  Post-op Complications: No  1. Post-op CVA or TIA: No  If yes: Event classification (right eye, left eye, right cortical, left cortical, verterobasilar, other): n/a  If yes: Timing of event (intra-op, <6 hrs post-op, >=6 hrs post-op, unknown): n/a  2. CN injury: No  If yes: CN n/a injuried   3. Myocardial infarction: No  If yes: Dx by (EKG or clinical, Troponin): n/a  4.  CHF: No  5.  Dysrhythmia (new): No  6. Wound infection: No  7. Reperfusion symptoms:  No  8. Return to OR: No  If yes: return to OR for (bleeding, neurologic, other CEA incision, other): n/a  Discharge medications: Statin use:  Yes ASA use:  Yes   Beta blocker use:  Yes ACE-Inhibitor use:  No  ARB use:  No CCB use: No P2Y12 Antagonist  use: Yes, [ ]  Plavix, [ ]  Plasugrel, [ ]  Ticlopinine, [ ]  Ticagrelor, [ ]  Other, [ ]  No for medical reason, [ ]  Non-compliant, [ ]  Not-indicated Anti-coagulant use:  No, [ ]  Warfarin, [ ]  Rivaroxaban, [ ]  Dabigatran,

## 2017-11-15 NOTE — Plan of Care (Signed)
  Problem: Education: Goal: Knowledge of General Education information will improve Description Including pain rating scale, medication(s)/side effects and non-pharmacologic comfort measures Outcome: Completed/Met   Problem: Health Behavior/Discharge Planning: Goal: Ability to manage health-related needs will improve Outcome: Completed/Met

## 2017-11-15 NOTE — Plan of Care (Signed)
  Problem: Education: Goal: Knowledge of General Education information will improve Description Including pain rating scale, medication(s)/side effects and non-pharmacologic comfort measures Outcome: Progressing   Problem: Health Behavior/Discharge Planning: Goal: Ability to manage health-related needs will improve Outcome: Progressing   

## 2017-11-16 ENCOUNTER — Other Ambulatory Visit: Payer: Self-pay

## 2017-11-16 ENCOUNTER — Telehealth: Payer: Self-pay | Admitting: *Deleted

## 2017-11-16 DIAGNOSIS — E119 Type 2 diabetes mellitus without complications: Secondary | ICD-10-CM | POA: Diagnosis not present

## 2017-11-16 DIAGNOSIS — I2510 Atherosclerotic heart disease of native coronary artery without angina pectoris: Secondary | ICD-10-CM | POA: Diagnosis not present

## 2017-11-16 DIAGNOSIS — I6523 Occlusion and stenosis of bilateral carotid arteries: Secondary | ICD-10-CM

## 2017-11-16 DIAGNOSIS — Z48812 Encounter for surgical aftercare following surgery on the circulatory system: Secondary | ICD-10-CM | POA: Diagnosis not present

## 2017-11-16 DIAGNOSIS — Z7984 Long term (current) use of oral hypoglycemic drugs: Secondary | ICD-10-CM | POA: Diagnosis not present

## 2017-11-16 DIAGNOSIS — I10 Essential (primary) hypertension: Secondary | ICD-10-CM | POA: Diagnosis not present

## 2017-11-16 DIAGNOSIS — M6281 Muscle weakness (generalized): Secondary | ICD-10-CM | POA: Diagnosis not present

## 2017-11-16 LAB — GLUCOSE, CAPILLARY: GLUCOSE-CAPILLARY: 114 mg/dL — AB (ref 70–99)

## 2017-11-16 NOTE — Telephone Encounter (Signed)
Pt was on TCM report admitted 11/12/17 for Carotid artery stenosis. He had a CT angios demonstrating high-grade stenosis bilaterally. Pt was taken to the operating room on 11/12/2017 and underwent right TCAR. The remainder of the hospital course consisted of increasing mobilization and increasing intake of solids without difficulty. Pt D/C 11/15/17, and will follow-up w/Dr. Donzetta Matters in 4 wks.Marland KitchenJohny Chess

## 2017-11-18 DIAGNOSIS — E119 Type 2 diabetes mellitus without complications: Secondary | ICD-10-CM | POA: Diagnosis not present

## 2017-11-18 DIAGNOSIS — Z7984 Long term (current) use of oral hypoglycemic drugs: Secondary | ICD-10-CM | POA: Diagnosis not present

## 2017-11-18 DIAGNOSIS — I10 Essential (primary) hypertension: Secondary | ICD-10-CM | POA: Diagnosis not present

## 2017-11-18 DIAGNOSIS — M6281 Muscle weakness (generalized): Secondary | ICD-10-CM | POA: Diagnosis not present

## 2017-11-18 DIAGNOSIS — Z48812 Encounter for surgical aftercare following surgery on the circulatory system: Secondary | ICD-10-CM | POA: Diagnosis not present

## 2017-11-18 DIAGNOSIS — I2510 Atherosclerotic heart disease of native coronary artery without angina pectoris: Secondary | ICD-10-CM | POA: Diagnosis not present

## 2017-11-20 DIAGNOSIS — M6281 Muscle weakness (generalized): Secondary | ICD-10-CM | POA: Diagnosis not present

## 2017-11-20 DIAGNOSIS — I10 Essential (primary) hypertension: Secondary | ICD-10-CM | POA: Diagnosis not present

## 2017-11-20 DIAGNOSIS — I2510 Atherosclerotic heart disease of native coronary artery without angina pectoris: Secondary | ICD-10-CM | POA: Diagnosis not present

## 2017-11-20 DIAGNOSIS — E119 Type 2 diabetes mellitus without complications: Secondary | ICD-10-CM | POA: Diagnosis not present

## 2017-11-20 DIAGNOSIS — Z48812 Encounter for surgical aftercare following surgery on the circulatory system: Secondary | ICD-10-CM | POA: Diagnosis not present

## 2017-11-20 DIAGNOSIS — Z7984 Long term (current) use of oral hypoglycemic drugs: Secondary | ICD-10-CM | POA: Diagnosis not present

## 2017-11-21 ENCOUNTER — Emergency Department (HOSPITAL_COMMUNITY)
Admission: EM | Admit: 2017-11-21 | Discharge: 2017-11-21 | Disposition: A | Discharge status: Home / Self Care | Payer: Medicare Other | Attending: Emergency Medicine | Admitting: Emergency Medicine

## 2017-11-21 ENCOUNTER — Encounter (HOSPITAL_COMMUNITY): Payer: Self-pay | Admitting: Emergency Medicine

## 2017-11-21 ENCOUNTER — Other Ambulatory Visit: Payer: Self-pay

## 2017-11-21 ENCOUNTER — Emergency Department (HOSPITAL_BASED_OUTPATIENT_CLINIC_OR_DEPARTMENT_OTHER): Payer: Medicare Other

## 2017-11-21 DIAGNOSIS — Z79899 Other long term (current) drug therapy: Secondary | ICD-10-CM | POA: Insufficient documentation

## 2017-11-21 DIAGNOSIS — M7981 Nontraumatic hematoma of soft tissue: Secondary | ICD-10-CM | POA: Insufficient documentation

## 2017-11-21 DIAGNOSIS — Z7982 Long term (current) use of aspirin: Secondary | ICD-10-CM | POA: Insufficient documentation

## 2017-11-21 DIAGNOSIS — I97610 Postprocedural hemorrhage and hematoma of a circulatory system organ or structure following a cardiac catheterization: Secondary | ICD-10-CM | POA: Diagnosis not present

## 2017-11-21 DIAGNOSIS — I97638 Postprocedural hematoma of a circulatory system organ or structure following other circulatory system procedure: Secondary | ICD-10-CM

## 2017-11-21 DIAGNOSIS — I10 Essential (primary) hypertension: Secondary | ICD-10-CM | POA: Diagnosis not present

## 2017-11-21 DIAGNOSIS — I2510 Atherosclerotic heart disease of native coronary artery without angina pectoris: Secondary | ICD-10-CM | POA: Diagnosis not present

## 2017-11-21 DIAGNOSIS — Z7984 Long term (current) use of oral hypoglycemic drugs: Secondary | ICD-10-CM | POA: Insufficient documentation

## 2017-11-21 DIAGNOSIS — T148XXA Other injury of unspecified body region, initial encounter: Secondary | ICD-10-CM

## 2017-11-21 DIAGNOSIS — E119 Type 2 diabetes mellitus without complications: Secondary | ICD-10-CM | POA: Diagnosis not present

## 2017-11-21 LAB — CBG MONITORING, ED: GLUCOSE-CAPILLARY: 74 mg/dL (ref 70–99)

## 2017-11-21 NOTE — Discharge Instructions (Addendum)
Follow-up with your vascular surgeon this week.  They will call you with an appointment.  Return sooner if bleeding reoccurs and will not stop.  Also return if the swelling gets significantly bigger

## 2017-11-21 NOTE — Consult Note (Signed)
Hospital Consult    Reason for Consult: Oozing from right neck hematoma at incision Referring Physician: ED MRN #:  [id number]  History of Present Illness: This is a 84 y.o. male with multiple medical comorbidities that presented to the ED for evaluation of oozing from his incision.  Patient underwent a TCAR procedure approximately 1 week ago.  Reportedly has noticed a lump at the base of his neck over the last 2 to 3 days per patient.  Family noticed little bit of drainage today and after holding pressure decided to come to the ED to be evaluated.  Patient states that the swelling does not bother him much.  He has no breathing issues or airway compromise.  ED has dressing the incision after cleaning the wound.   Past Medical History:  Diagnosis Date  . Anemia 09/04/2010  . Arthritis   . BPH (benign prostatic hyperplasia) 09/09/2010  . CAD (coronary artery disease)   . DDD (degenerative disc disease), cervical 09/04/2010  . Diabetes mellitus   . Diastolic dysfunction 2/77/8242  . Diverticulitis   . Glaucoma   . Hernia   . History of colonic diverticulitis 09/04/2010  . Hyperlipidemia   . Hypertension   . Psoriasis 09/04/2010    Past Surgical History:  Procedure Laterality Date  . broken legs  1981 and 1995  . PERIPHERAL VASCULAR CATHETERIZATION N/A 10/25/2014   Procedure: Abdominal Aortogram;  Surgeon: Wellington Hampshire, MD;  Location: [facility];  Service: Cardiovascular;  Laterality: N/A;  . PERIPHERAL VASCULAR CATHETERIZATION Bilateral 10/25/2014   Procedure: Lower Extremity Angiography;  Surgeon: Wellington Hampshire, MD;  Location: [facility];  Service: Cardiovascular;  Laterality: Bilateral;  . s/p lipoma  2003  . TONSILLECTOMY  1952  . TRANSCAROTID ARTERY REVASCULARIZATION Right 11/12/2017   Procedure: TRANSCAROTID ARTERY REVASCULARIZATION;  Surgeon: Waynetta Sandy, MD;  Location: [facility];  Service: Vascular;  Laterality: Right;    Allergies  Allergen  Reactions  . Levaquin [Levofloxacin In D5w] Nausea Only  . Tramadol Other (See Comments)    somnolence    Prior to Admission medications   Medication Sig Start Date End Date Taking? Authorizing Provider  acetaminophen (TYLENOL) 500 MG tablet Take 500 mg by mouth every 6 (six) hours as needed for mild pain.    [provider]  aspirin EC 81 MG tablet Take 81 mg by mouth daily.    [provider]  atorvastatin (LIPITOR) 20 MG tablet TAKE 1 TABLET (20 MG TOTAL) BY MOUTH DAILY. 01/21/17   [name], MD  bromocriptine (PARLODEL) 2.5 MG tablet TAKE 1/2 TABLET AT BEDTIME 12/27/16   Renato Shin, MD  ciclopirox (LOPROX) 0.77 % cream Apply 1 application topically daily.     [provider]  cilostazol (PLETAL) 100 MG tablet TAKE 1 TABLET BY MOUTH TWICE A DAY 10/28/17   Wellington Hampshire, MD  clobetasol (TEMOVATE) 0.05 % external solution Apply topically 2 (two) times daily. 10/05/14   [name], MD  clopidogrel (PLAVIX) 75 MG tablet Take 1 tablet (75 mg total) by mouth daily. 11/06/17   Waynetta Sandy, MD  glucose blood [facility] VERIO) test strip 1 each by Other route daily. And lancets 1/day 250.00 10/20/16   Renato Shin, MD  isosorbide mononitrate (IMDUR) 60 MG 24 hr tablet Take 0.5 tablets (30 mg total) by mouth daily. Must keep scheduled appt for future refills 08/25/17   [name], MD  metFORMIN (GLUCOPHAGE-XR) 500 MG 24 hr tablet  Take 2 tablets (1,000 mg total) by mouth 2 (two) times daily. 06/12/17   Renato Shin, MD  metoprolol succinate (TOPROL-XL) 50 MG 24 hr tablet TAKE 1 TABLET BY MOUTH DAILY WITH OR IMMEDIATELY FOLLOWING A MEAL 06/23/17   Wellington Hampshire, MD  repaglinide (PRANDIN) 1 MG tablet Take 1 tablet (1 mg total) by mouth 3 (three) times daily before meals. 06/12/17   Renato Shin, MD  tiZANidine (ZANAFLEX) 2 MG tablet Take 1 tablet (2 mg total) by mouth every 6 (six) hours as needed for muscle spasms. 08/27/17   [name], MD  TRAVATAN  Z 0.004 % SOLN ophthalmic solution Place 1 drop into both eyes at bedtime. 06/29/15   [provider]    Social History   Socioeconomic History  . Marital status: Single    Spouse name: Not on file  . Number of children: Not on file  . Years of education: Not on file  . Highest education level: Not on file  Occupational History  . Occupation: retired  Scientific laboratory technician  . Financial resource strain: Not on file  . Food insecurity:    Worry: Not on file    Inability: Not on file  . Transportation needs:    Medical: Not on file    Non-medical: Not on file  Tobacco Use  . Smoking status: Never Smoker  . Smokeless tobacco: Never Used  Substance and Sexual Activity  . Alcohol use: No  . Drug use: No  . Sexual activity: Never  Lifestyle  . Physical activity:    Days per week: Not on file    Minutes per session: Not on file  . Stress: Not on file  Relationships  . Social connections:    Talks on phone: Not on file    Gets together: Not on file    Attends religious service: Not on file    Active member of club or organization: Not on file    Attends meetings of clubs or organizations: Not on file    Relationship status: Not on file  . Intimate partner violence:    Fear of current or ex partner: Not on file    Emotionally abused: Not on file    Physically abused: Not on file    Forced sexual activity: Not on file  Other Topics Concern  . Not on file  Social History Narrative  . Not on file     Family History  Problem Relation Age of Onset  . Arthritis Mother   . Heart disease Mother   . Hypertension Mother   . Arthritis Father   . Heart disease Father   . Hypertension Father   . Cancer Other        lung cancer    ROS: [x]  Positive   [ ]  Negative   [ ]  All sytems reviewed and are negative  Cardiovascular: []  chest pain/pressure []  palpitations []  SOB lying flat []  DOE []  pain in legs while walking []  pain in legs at rest []  pain in legs at night []   non-healing ulcers []  hx of DVT []  swelling in legs  Pulmonary: []  productive cough []  asthma/wheezing []  home O2  Neurologic: []  weakness in []  arms []  legs []  numbness in []  arms []  legs []  hx of CVA []  mini stroke [] difficulty speaking or slurred speech []  temporary loss of vision in one eye []  dizziness  Hematologic: []  hx of cancer []  bleeding problems []  problems with blood clotting easily  Endocrine:   []   diabetes []  thyroid disease  GI []  vomiting blood []  blood in stool  GU: []  CKD/renal failure []  HD--[]  M/W/F or []  T/T/S []  burning with urination []  blood in urine  Psychiatric: []  anxiety []  depression  Musculoskeletal: []  arthritis []  joint pain  Integumentary: []  rashes []  ulcers X neck swelling  Constitutional: []  fever []  chills   Physical Examination  Vitals:   11/21/17 1345 11/21/17 1400  BP: (!) 109/56 (!) 102/59  Pulse: 70 74  Resp:    Temp:    SpO2: 97% 97%   There is no height or weight on file to calculate BMI.  General:  NAD, resting pleasantly HENT: Small hematoma at base of right neck Pulmonary: normal non-labored breathing, without Rales, rhonchi,  wheezing Cardiac: regular, without  Murmurs, rubs or gallops Abdomen: soft, NT/ND, no masses Skin: some ecchymosis over hematoma at base of neck Vascular Exam/Pulses: Right carotid pulse, left carotid pulse Extremities: without ischemic changes, without Gangrene , without cellulitis; without open wounds;  Musculoskeletal: no muscle wasting or atrophy  Neurologic: A&O X 3;    CBC    Component Value Date/Time   WBC 6.7 11/14/2017 0400   RBC 3.37 (L) 11/14/2017 0400   HGB 9.2 (L) 11/14/2017 0400   HCT 29.2 (L) 11/14/2017 0400   PLT 138 (L) 11/14/2017 0400   MCV 86.6 11/14/2017 0400   MCH 27.3 11/14/2017 0400   MCHC 31.5 11/14/2017 0400   RDW 14.2 11/14/2017 0400   LYMPHSABS 0.8 10/20/2017 1209   MONOABS 0.5 10/20/2017 1209   EOSABS 0.0 10/20/2017 1209    BASOSABS 0.0 10/20/2017 1209    BMET    Component Value Date/Time   NA 140 11/14/2017 0400   K 3.8 11/14/2017 0400   CL 107 11/14/2017 0400   CO2 27 11/14/2017 0400   GLUCOSE 125 (H) 11/14/2017 0400   BUN 14 11/14/2017 0400   CREATININE 0.76 11/14/2017 0400   CALCIUM 8.6 (L) 11/14/2017 0400   GFRNONAA >60 11/14/2017 0400   GFRAA >60 11/14/2017 0400    COAGS: Lab Results  Component Value Date   INR 1.05 11/12/2017   INR 1.08 10/20/2017   INR 1.1 (H) 10/17/2014     Non-Invasive Vascular Imaging:   Personally reviewed his carotid duplex in the vascular lab and best we could tell there was no flow in the hematoma.  It appears completely thrombosed.  There is no evidence of pseudoaneurysm.   ASSESSMENT/PLAN: This is a 84 y.o. male with a neck hematoma 9 days post-op status post TCAR for symptomatic carotid stenosis.  I reviewed his duplex with the vascular tech and we saw no flow within the hematoma to suggest underlying pseudoaneurysm or communication with the repair.  Patient states he has noticed a lump on the neck for several days and he is now 9 days post-op.  Appreciate the ED cleaning the wound and providing a new dressing.  He should be okay for discharge home as I discussed with family.  Can arrange for short interval follow-up early this week in clinic to make sure things are continuing to improve and for wound check.  I discussed with his family that if they feel like hematoma continues to expand or gets worse certainly come back and we would happy to reevaluate him.  He is not very impressive on exam and would not drain at this time.  Marty Heck, MD [facility] Office: [phone] Pager: [phone]

## 2017-11-21 NOTE — Progress Notes (Signed)
*  PRELIMINARY RESULTS* Vascular Ultrasound Right Carotid Duplex (Doppler) has been completed. The ICA stent is patent with <50% stenosis. The right subclavian artery exhibits retrograde flow.   Incidental finding: there is a heterogenous area of the right inferior lateral neck measuring 5.2 x 2.8 x 4.4cm. This is suggestive of hematoma versus unknown etiology.  Preliminary results discussed with Dr. Carlis Abbott.  11/21/2017 4:28 PM Maudry Mayhew, MHA, RVT, RDCS, RDMS

## 2017-11-21 NOTE — ED Notes (Signed)
Pt and family understood dc material. NAD noted. Dressing changed prior to DC

## 2017-11-21 NOTE — ED Provider Notes (Signed)
[facility] EMERGENCY DEPARTMENT Provider Note   CSN: [id number] Arrival date & time: 11/21/17  1117     History   Chief Complaint Chief Complaint  Patient presents with  . Post-op Problem    HPI Jeffrey Kaiser is a 84 y.o. male.  Patient had a trans-carotid stent placed on August 8.  He noticed some bleeding today.  He also had swelling  The history is provided by the patient and a relative. No language interpreter was used.  Illness  This is a new problem. The current episode started 12 to 24 hours ago. The problem occurs constantly. The problem has not changed since onset.Pertinent negatives include no chest pain, no abdominal pain and no headaches. Nothing aggravates the symptoms. Nothing relieves the symptoms. He has tried nothing for the symptoms.    Past Medical History:  Diagnosis Date  . Anemia 09/04/2010  . Arthritis   . BPH (benign prostatic hyperplasia) 09/09/2010  . CAD (coronary artery disease)   . DDD (degenerative disc disease), cervical 09/04/2010  . Diabetes mellitus   . Diastolic dysfunction 2/95/2841  . Diverticulitis   . Glaucoma   . Hernia   . History of colonic diverticulitis 09/04/2010  . Hyperlipidemia   . Hypertension   . Psoriasis 09/04/2010    Patient Active Problem List   Diagnosis Date Noted  . Carotid artery stenosis, symptomatic, right 11/12/2017  . Carotid artery stenosis 11/12/2017  . TIA (transient ischemic attack) 10/20/2017  . Neck pain 08/27/2017  . Diabetes ([place]) 06/13/2015  . Tinea cruris 10/31/2014  . Phimosis 10/05/2014  . Leg pain, bilateral 10/05/2014  . Rash and nonspecific skin eruption 01/30/2014  . Calf pain 01/30/2014  . Acute neck pain 10/26/2013  . RUQ abdominal pain 04/26/2013  . Neck pain, bilateral posterior 12/03/2012  . Left inguinal hernia 02/03/2012  . Right shoulder pain 11/20/2010  . BPH (benign prostatic hyperplasia) 09/09/2010  . CAD (coronary artery disease)   . History of colonic  diverticulitis 09/04/2010  . Preventative health care 09/04/2010  . Anemia 09/04/2010  . DDD (degenerative disc disease), cervical 09/04/2010  . Diastolic dysfunction 32/44/0102  . Fatigue 09/04/2010  . Psoriasis 09/04/2010  . Arthritis   . Glaucoma   . Hyperlipidemia   . Hypertension     Past Surgical History:  Procedure Laterality Date  . broken legs  1981 and 1995  . PERIPHERAL VASCULAR CATHETERIZATION N/A 10/25/2014   Procedure: Abdominal Aortogram;  Surgeon: Wellington Hampshire, MD;  Location: [facility];  Service: Cardiovascular;  Laterality: N/A;  . PERIPHERAL VASCULAR CATHETERIZATION Bilateral 10/25/2014   Procedure: Lower Extremity Angiography;  Surgeon: Wellington Hampshire, MD;  Location: [facility];  Service: Cardiovascular;  Laterality: Bilateral;  . s/p lipoma  2003  . TONSILLECTOMY  1952  . TRANSCAROTID ARTERY REVASCULARIZATION Right 11/12/2017   Procedure: TRANSCAROTID ARTERY REVASCULARIZATION;  Surgeon: Waynetta Sandy, MD;  Location: [facility];  Service: Vascular;  Laterality: Right;        Home Medications    Prior to Admission medications   Medication Sig Start Date End Date Taking? Authorizing Provider  acetaminophen (TYLENOL) 500 MG tablet Take 500 mg by mouth every 6 (six) hours as needed for mild pain.    [provider]  aspirin EC 81 MG tablet Take 81 mg by mouth daily.    [provider]  atorvastatin (LIPITOR) 20 MG tablet TAKE 1 TABLET (20 MG TOTAL) BY MOUTH DAILY. 01/21/17  [name], MD  bromocriptine (PARLODEL) 2.5 MG tablet TAKE 1/2 TABLET AT BEDTIME 12/27/16   Renato Shin, MD  ciclopirox (LOPROX) 0.77 % cream Apply 1 application topically daily.     [provider]  cilostazol (PLETAL) 100 MG tablet TAKE 1 TABLET BY MOUTH TWICE A DAY 10/28/17   Wellington Hampshire, MD  clobetasol (TEMOVATE) 0.05 % external solution Apply topically 2 (two) times daily. 10/05/14   [name], MD  clopidogrel (PLAVIX)  75 MG tablet Take 1 tablet (75 mg total) by mouth daily. 11/06/17   Waynetta Sandy, MD  glucose blood [facility] VERIO) test strip 1 each by Other route daily. And lancets 1/day 250.00 10/20/16   Renato Shin, MD  isosorbide mononitrate (IMDUR) 60 MG 24 hr tablet Take 0.5 tablets (30 mg total) by mouth daily. Must keep scheduled appt for future refills 08/25/17   [name], MD  metFORMIN (GLUCOPHAGE-XR) 500 MG 24 hr tablet Take 2 tablets (1,000 mg total) by mouth 2 (two) times daily. 06/12/17   Renato Shin, MD  metoprolol succinate (TOPROL-XL) 50 MG 24 hr tablet TAKE 1 TABLET BY MOUTH DAILY WITH OR IMMEDIATELY FOLLOWING A MEAL 06/23/17   Wellington Hampshire, MD  repaglinide (PRANDIN) 1 MG tablet Take 1 tablet (1 mg total) by mouth 3 (three) times daily before meals. 06/12/17   Renato Shin, MD  tiZANidine (ZANAFLEX) 2 MG tablet Take 1 tablet (2 mg total) by mouth every 6 (six) hours as needed for muscle spasms. 08/27/17   [name], MD  TRAVATAN Z 0.004 % SOLN ophthalmic solution Place 1 drop into both eyes at bedtime. 06/29/15   [provider]    Family History Family History  Problem Relation Age of Onset  . Arthritis Mother   . Heart disease Mother   . Hypertension Mother   . Arthritis Father   . Heart disease Father   . Hypertension Father   . Cancer Other        lung cancer    Social History Social History   Tobacco Use  . Smoking status: Never Smoker  . Smokeless tobacco: Never Used  Substance Use Topics  . Alcohol use: No  . Drug use: No     Allergies   Levaquin [levofloxacin in d5w] and Tramadol   Review of Systems Review of Systems  Constitutional: Negative for appetite change and fatigue.  HENT: Negative for congestion, ear discharge and sinus pressure.        Swelling and bleeding around the incision and neck  Eyes: Negative for discharge.  Respiratory: Negative for cough.   Cardiovascular: Negative for chest pain.  Gastrointestinal:  Negative for abdominal pain and diarrhea.  Genitourinary: Negative for frequency and hematuria.  Musculoskeletal: Negative for back pain.  Skin: Negative for rash.  Neurological: Negative for seizures and headaches.  Psychiatric/Behavioral: Negative for hallucinations.     Physical Exam Updated Vital Signs BP (!) 116/54   Pulse 71   Temp 97.7 F (36.5 C) (Oral)   Resp 14   SpO2 98%   Physical Exam  Constitutional: He is oriented to person, place, and time. He appears well-developed.  HENT:  Head: Normocephalic.  Patient has an incision where the trans-carotid stent was placed.  The incision is opening slightly and he has bleeding.  Patient has a small hematoma around this area  Eyes: Conjunctivae and EOM are normal. No scleral icterus.  Neck: Neck supple. No thyromegaly present.  Cardiovascular: Normal rate  and regular rhythm. Exam reveals no gallop and no friction rub.  No murmur heard. Pulmonary/Chest: No stridor. He has no wheezes. He has no rales. He exhibits no tenderness.  Abdominal: He exhibits no distension. There is no tenderness. There is no rebound.  Musculoskeletal: Normal range of motion. He exhibits no edema.  Lymphadenopathy:    He has no cervical adenopathy.  Neurological: He is oriented to person, place, and time. He exhibits normal muscle tone. Coordination normal.  Skin: No rash noted. No erythema.  Psychiatric: He has a normal mood and affect. His behavior is normal.     ED Treatments / Results  Labs (all labs ordered are listed, but only abnormal results are displayed) Labs Reviewed  CBG MONITORING, ED    EKG None  Radiology No results found.  Procedures Procedures (including critical care time)  Medications Ordered in ED Medications - No data to display   Initial Impression / Assessment and Plan / ED Course  I have reviewed the triage vital signs and the nursing notes.  Pertinent labs & imaging results that were available during my  care of the patient were reviewed by me and considered in my medical decision making (see chart for details).     Patient incision was cleaned thoroughly and 2 Steri-Strips were placed by the nurse.  A pressure dressing was applied.  And the bleeding stopped.  He was seen by vascular surgery who evaluated the hematoma and they will see the patient again this week for reevaluation  Final Clinical Impressions(s) / ED Diagnoses   Final diagnoses:  Hematoma    ED Discharge Orders    None       Milton Ferguson, MD 11/21/17 1642

## 2017-11-21 NOTE — ED Notes (Addendum)
SN performed wd care to lower neck surgical site as instructed: cleansed with NS; patted dry; applied steri-strips; covered with DSD; secure d with transparent drsg. Pt tolerated well.

## 2017-11-21 NOTE — ED Triage Notes (Addendum)
Pt reports bleeding from EJ site of catheter stint placement done on 11/12/17.  Pt reports site was glued, woke this am with small circle of dried blood on shirt.  Site oozing at this time, pt holding gauze for pressure.  Pt denies dizziness or pain.

## 2017-11-23 ENCOUNTER — Other Ambulatory Visit: Payer: Self-pay

## 2017-11-23 ENCOUNTER — Ambulatory Visit (INDEPENDENT_AMBULATORY_CARE_PROVIDER_SITE_OTHER): Payer: Self-pay | Admitting: Physician Assistant

## 2017-11-23 VITALS — BP 160/74 | HR 79 | Temp 98.0°F | Resp 16 | Ht 65.0 in | Wt 146.0 lb

## 2017-11-23 DIAGNOSIS — Z48812 Encounter for surgical aftercare following surgery on the circulatory system: Secondary | ICD-10-CM | POA: Diagnosis not present

## 2017-11-23 DIAGNOSIS — I6523 Occlusion and stenosis of bilateral carotid arteries: Secondary | ICD-10-CM

## 2017-11-23 DIAGNOSIS — I2510 Atherosclerotic heart disease of native coronary artery without angina pectoris: Secondary | ICD-10-CM | POA: Diagnosis not present

## 2017-11-23 DIAGNOSIS — I10 Essential (primary) hypertension: Secondary | ICD-10-CM | POA: Diagnosis not present

## 2017-11-23 DIAGNOSIS — E119 Type 2 diabetes mellitus without complications: Secondary | ICD-10-CM | POA: Diagnosis not present

## 2017-11-23 DIAGNOSIS — M6281 Muscle weakness (generalized): Secondary | ICD-10-CM | POA: Diagnosis not present

## 2017-11-23 DIAGNOSIS — Z7984 Long term (current) use of oral hypoglycemic drugs: Secondary | ICD-10-CM | POA: Diagnosis not present

## 2017-11-23 NOTE — Progress Notes (Signed)
POST OPERATIVE OFFICE NOTE    CC:  F/u for surgery  HPI:  This is a 84 y.o. male who is s/p Right trans-carotid artery stent with 7 x 40 mm stent.  He was seen in the ED 11/21/2017 patient incision was cleaned thoroughly and 2 Steri-Strips were placed by the nurse.  A pressure dressing was applied.  And the bleeding stopped.  Dr. Carlis Abbott saw the patient in the ED and had a vascular ultrasound performed which did not show a pseudoaneurysm.  He was released home.  The patients daughter called to report staining on the banaide and wanted to have it looked at again.  He is here today for further evaluation of small incisional hematoma with drainage.  The blood is very dark in color and there is less drainage each day.  He denise swallowing difficulties, no change in the size of the hematoma, no fever or chills.    Allergies  Allergen Reactions  . Levaquin [Levofloxacin In D5w] Nausea Only  . Tramadol Other (See Comments)    somnolence    Current Outpatient Medications  Medication Sig Dispense Refill  . acetaminophen (TYLENOL) 500 MG tablet Take 500 mg by mouth every 6 (six) hours as needed for mild pain.    Marland Kitchen aspirin EC 81 MG tablet Take 81 mg by mouth daily.    Marland Kitchen atorvastatin (LIPITOR) 20 MG tablet TAKE 1 TABLET (20 MG TOTAL) BY MOUTH DAILY. 90 tablet 2  . bromocriptine (PARLODEL) 2.5 MG tablet TAKE 1/2 TABLET AT BEDTIME 45 tablet 0  . ciclopirox (LOPROX) 0.77 % cream Apply 1 application topically daily.     . cilostazol (PLETAL) 100 MG tablet TAKE 1 TABLET BY MOUTH TWICE A DAY 180 tablet 1  . clobetasol (TEMOVATE) 0.05 % external solution Apply topically 2 (two) times daily. 50 mL 1  . clopidogrel (PLAVIX) 75 MG tablet Take 1 tablet (75 mg total) by mouth daily. 30 tablet 6  . glucose blood (ONETOUCH VERIO) test strip 1 each by Other route daily. And lancets 1/day 250.00 100 each 12  . isosorbide mononitrate (IMDUR) 60 MG 24 hr tablet Take 0.5 tablets (30 mg total) by mouth daily. Must keep  scheduled appt for future refills 15 tablet 0  . metFORMIN (GLUCOPHAGE-XR) 500 MG 24 hr tablet Take 2 tablets (1,000 mg total) by mouth 2 (two) times daily. 120 tablet 11  . metoprolol succinate (TOPROL-XL) 50 MG 24 hr tablet TAKE 1 TABLET BY MOUTH DAILY WITH OR IMMEDIATELY FOLLOWING A MEAL 90 tablet 2  . repaglinide (PRANDIN) 1 MG tablet Take 1 tablet (1 mg total) by mouth 3 (three) times daily before meals. 90 tablet 11  . tiZANidine (ZANAFLEX) 2 MG tablet Take 1 tablet (2 mg total) by mouth every 6 (six) hours as needed for muscle spasms. 60 tablet 1  . TRAVATAN Z 0.004 % SOLN ophthalmic solution Place 1 drop into both eyes at bedtime.  4   No current facility-administered medications for this visit.      ROS:  See HPI  Physical Exam:  Vitals:   11/23/17 1456 11/23/17 1459  BP: (!) 186/74 (!) 160/74  Pulse: 79   Resp: 16   Temp: 98 F (36.7 C)   SpO2: 97%     Incision:  Well healed with minimal ecchymosis.  I expressed 1 cc of dark old blood from the distal incision with manual pressure.  Dry dressing applied over incision to Protect  his clothing. Extremities:  Palpable radial pulses  B, grip 5/5, no neurologic deficits. Heart: RRR Abdomen:  Soft + BS  Assessment/Plan:  This is a 84 y.o. male who is s/p: 11 days post-op status post TCAR for symptomatic carotid stenosis.  Resolving small incisional hematoma with minimal dark bloody drainage from distal incision.    The family states the "lump"/ hematoma is smaller and the drainage is less.  I recommended showing regular and gradually getting back to his baseline activity.  Dry dressing as needed until the drainage stops.  We prefer the old blood drain out instead of under the skin.  I think this will be fully resolved over the next few days.  He has a 1 month f/u that he will keep and a f/u duplex.  They will call if they have further concerns.     Roxy Horseman , PA-C [facility] [phone]

## 2017-11-24 ENCOUNTER — Encounter: Payer: Self-pay | Admitting: Adult Health

## 2017-11-24 ENCOUNTER — Ambulatory Visit (INDEPENDENT_AMBULATORY_CARE_PROVIDER_SITE_OTHER): Payer: Medicare Other | Admitting: Adult Health

## 2017-11-24 VITALS — BP 163/74 | HR 83 | Ht 65.0 in | Wt 143.0 lb

## 2017-11-24 DIAGNOSIS — I6521 Occlusion and stenosis of right carotid artery: Secondary | ICD-10-CM

## 2017-11-24 DIAGNOSIS — E7849 Other hyperlipidemia: Secondary | ICD-10-CM | POA: Diagnosis not present

## 2017-11-24 DIAGNOSIS — E119 Type 2 diabetes mellitus without complications: Secondary | ICD-10-CM | POA: Diagnosis not present

## 2017-11-24 DIAGNOSIS — I10 Essential (primary) hypertension: Secondary | ICD-10-CM

## 2017-11-24 DIAGNOSIS — G459 Transient cerebral ischemic attack, unspecified: Secondary | ICD-10-CM | POA: Diagnosis not present

## 2017-11-24 NOTE — Patient Instructions (Signed)
Continue aspirin 81 mg daily and clopidogrel 75 mg daily  and lipitor  for secondary stroke prevention  Continue to follow up with PCP regarding cholesterol, diabetes and blood pressure management   Continue to follow with Dr. Donzetta Matters as scheduled  Continue home therapies and ensure PT will be coming to house- if not, please call and an order will be placed  Continue to stay active and maintain a healthy diet  Continue to monitor blood pressure at home  Maintain strict control of hypertension with blood pressure goal below 130/90, diabetes with hemoglobin A1c goal below 6.5% and cholesterol with LDL cholesterol (bad cholesterol) goal below 70 mg/dL. I also advised the patient to eat a healthy diet with plenty of whole grains, cereals, fruits and vegetables, exercise regularly and maintain ideal body weight.  Followup in the future with me in 3 months or call earlier if needed       Thank you for coming to see [place] at [facility]. I hope we have been able to provide you high quality care today.  You may receive a patient satisfaction survey over the next few weeks. We would appreciate your feedback and comments so that we may continue to improve ourselves and the health of our patients.

## 2017-11-24 NOTE — Progress Notes (Signed)
[facility] [address] [place]. [place] [address] [phone]       OFFICE FOLLOW UP NOTE  [name] Date of Birth:  1925-09-05 Medical Record Number:  [id number]   Reason for Referral:  hospital TIA follow up  CHIEF COMPLAINT:  Chief Complaint  Patient presents with  . Follow-up    Stroke follow up pt at hospital pt has walker, pt with Jeffrey Kaiser his daughter, Jeffrey Kaiser his wide    HPI: Jeffrey Kaiser is being seen today for initial visit in the office for TIA due to cough related syncope episode in the setting of stenosis of bilateral ICAs on 10/20/2017. History obtained from patient, daughter, wife and chart review. Reviewed all radiology images and labs personally.  [name] is a 85 y.o. male with history of CAD, DM, HTN, HLD  who was admitted for presyncope episode after severe coughing followed by mild left facial droop. No tPA given due to rapidly resolving symptoms.  MRI head reviewed and was negative for acute stroke.  CTA head and neck showed right ICA bulb high-grade stenosis, left ICA bulb 70% stenosis, right VA origin atherosclerosis and bilateral ICA siphons high-grade stenosis.  2D echo showed EF of 55 to 60%.    LDL 74 and recommended continuation of Lipitor 20 mg.  HTN stable during admission and recommended BP goal of 1 30-1 50 due to bilateral ICA stenosis and avoidance of hypotension.  A1c 8.3 and recommended tight glycemic control with close PCP follow-up.  Patient was previously on aspirin 81 mg along with Pletal and recommended continuation of both.  Due to bilateral ICA stenosis, recommended referral to [facility] for close follow-up.  Patient was discharged home in stable condition. Patient followed up with vascular surgery on 11/06/2017 with recommendation of starting DAPT with aspirin and Plavix and patient underwent trans-carotid artery revascularization on 11/12/2017 in which he tolerated well without complication. Patient return to ED on  11/20/2017 after noticing bleeding and swelling from recent TCAR site.  It lasted approximately 1224 hrs. prior to patient coming into emergency room.  Pressure dressing was applied and bleeding resolved.  Patient was discharged back home with recommendations of vascular surgery follow-up.  Patient did have follow-up appointment on 11/23/2017 with vascular surgery who assessed incision site without further recommendations.   Patient is being seen today for hospital follow-up and is accompanied by his daughter and wife.  Overall he is doing well from a stroke standpoint and is also recovering well from procedure.  His daughter continuously checks on dressings and states and no further bleeding has been observed.  Patient continues to take both aspirin and Plavix without side effects of bleeding or bruising.  Continues to take Lipitor without side effects myalgias.  Does monitor glucose levels at home in 14 days average 151.  Blood pressure today elevated at appointment at 163/74 but this is monitored at home and typically 130s over 60s.  Patient will be undergoing repeat carotid ultrasound in 1 month and does have appointment with vascular surgery on 12/11/2017.  Home therapies were recommended after procedure and so far only occupational therapy has started.  Daughter is unsure if physical therapy is starting and was recommended to call agency to ensure physical therapy has also been ordered as it typically is with any type of home therapy but if it was not order, advised daughter to call in order will be placed.  Patient is currently using rolling walker in which she did  not use prior and daughter and wife both state that patient has had a lack of motivation to be ambulating or to be active.  Highly encourage patient to gradually increase activities as recommended by vascular surgery in order to avoid deconditioning.  Denies new or worsening stroke/TIA symptoms.   ROS:   14 system review of systems performed and  negative with exception of swelling in legs, itching, diarrhea, constipation, incontinence, feeling cold, joint pain, aching muscles, runny nose, skin sensitivity, weakness, difficulty swallowing, and insomnia  PMH:  Past Medical History:  Diagnosis Date  . Anemia 09/04/2010  . Arthritis   . BPH (benign prostatic hyperplasia) 09/09/2010  . CAD (coronary artery disease)   . DDD (degenerative disc disease), cervical 09/04/2010  . Diabetes mellitus   . Diastolic dysfunction 4/65/0354  . Diverticulitis   . Glaucoma   . Hernia   . History of colonic diverticulitis 09/04/2010  . Hyperlipidemia   . Hypertension   . Psoriasis 09/04/2010  . Stroke [facility])     PSH:  Past Surgical History:  Procedure Laterality Date  . broken legs  1981 and 1995  . PERIPHERAL VASCULAR CATHETERIZATION N/A 10/25/2014   Procedure: Abdominal Aortogram;  Surgeon: Wellington Hampshire, MD;  Location: [place] [facility];  Service: Cardiovascular;  Laterality: N/A;  . PERIPHERAL VASCULAR CATHETERIZATION Bilateral 10/25/2014   Procedure: Lower Extremity Angiography;  Surgeon: Wellington Hampshire, MD;  Location: [place] [facility];  Service: Cardiovascular;  Laterality: Bilateral;  . s/p lipoma  2003  . TONSILLECTOMY  1952  . TRANSCAROTID ARTERY REVASCULARIZATION Right 11/12/2017   Procedure: TRANSCAROTID ARTERY REVASCULARIZATION;  Surgeon: Waynetta Sandy, MD;  Location: [facility] OR;  Service: Vascular;  Laterality: Right;    Social History:  Social History   Socioeconomic History  . Marital status: Single    Spouse name: Not on file  . Number of children: Not on file  . Years of education: Not on file  . Highest education level: Not on file  Occupational History  . Occupation: retired  Scientific laboratory technician  . Financial resource strain: Not on file  . Food insecurity:    Worry: Not on file    Inability: Not on file  . Transportation needs:    Medical: Not on file    Non-medical: Not on file  Tobacco Use  . Smoking  status: Never Smoker  . Smokeless tobacco: Never Used  Substance and Sexual Activity  . Alcohol use: No  . Drug use: No  . Sexual activity: Never  Lifestyle  . Physical activity:    Days per week: Not on file    Minutes per session: Not on file  . Stress: Not on file  Relationships  . Social connections:    Talks on phone: Not on file    Gets together: Not on file    Attends religious service: Not on file    Active member of club or organization: Not on file    Attends meetings of clubs or organizations: Not on file    Relationship status: Not on file  . Intimate partner violence:    Fear of current or ex partner: Not on file    Emotionally abused: Not on file    Physically abused: Not on file    Forced sexual activity: Not on file  Other Topics Concern  . Not on file  Social History Narrative  . Not on file    Family History:  Family History  Problem Relation Age of  Onset  . Arthritis Mother   . Heart disease Mother   . Hypertension Mother   . Arthritis Father   . Heart disease Father   . Hypertension Father   . Cancer Other        lung cancer    Medications:   Current Outpatient Medications on File Prior to Visit  Medication Sig Dispense Refill  . acetaminophen (TYLENOL) 500 MG tablet Take 500 mg by mouth every 6 (six) hours as needed for mild pain.    Marland Kitchen aspirin EC 81 MG tablet Take 81 mg by mouth daily.    Marland Kitchen atorvastatin (LIPITOR) 20 MG tablet TAKE 1 TABLET (20 MG TOTAL) BY MOUTH DAILY. 90 tablet 2  . bromocriptine (PARLODEL) 2.5 MG tablet TAKE 1/2 TABLET AT BEDTIME 45 tablet 0  . ciclopirox (LOPROX) 0.77 % cream Apply 1 application topically daily.     . cilostazol (PLETAL) 100 MG tablet TAKE 1 TABLET BY MOUTH TWICE A DAY 180 tablet 1  . clobetasol (TEMOVATE) 0.05 % external solution Apply topically 2 (two) times daily. 50 mL 1  . clopidogrel (PLAVIX) 75 MG tablet Take 1 tablet (75 mg total) by mouth daily. 30 tablet 6  . glucose blood (ONETOUCH VERIO) test  strip 1 each by Other route daily. And lancets 1/day 250.00 100 each 12  . isosorbide mononitrate (IMDUR) 60 MG 24 hr tablet Take 0.5 tablets (30 mg total) by mouth daily. Must keep scheduled appt for future refills 15 tablet 0  . metFORMIN (GLUCOPHAGE-XR) 500 MG 24 hr tablet Take 2 tablets (1,000 mg total) by mouth 2 (two) times daily. 120 tablet 11  . metoprolol succinate (TOPROL-XL) 50 MG 24 hr tablet TAKE 1 TABLET BY MOUTH DAILY WITH OR IMMEDIATELY FOLLOWING A MEAL 90 tablet 2  . repaglinide (PRANDIN) 1 MG tablet Take 1 tablet (1 mg total) by mouth 3 (three) times daily before meals. 90 tablet 11  . tiZANidine (ZANAFLEX) 2 MG tablet Take 1 tablet (2 mg total) by mouth every 6 (six) hours as needed for muscle spasms. 60 tablet 1  . TRAVATAN Z 0.004 % SOLN ophthalmic solution Place 1 drop into both eyes at bedtime.  4   No current facility-administered medications on file prior to visit.     Allergies:   Allergies  Allergen Reactions  . Levaquin [Levofloxacin In D5w] Nausea Only  . Tramadol Other (See Comments)    somnolence     Physical Exam  Vitals:   11/24/17 1441  BP: (!) 163/74  Pulse: 83  Weight: 143 lb (64.9 kg)  Height: 5\' 5"  (1.651 m)   Body mass index is 23.8 kg/m. No exam data present  General: well developed, well nourished, pleasant elderly Caucasian male, seated, in no evident distress Head: head normocephalic and atraumatic.   Neck: supple with no carotid or supraclavicular bruits Cardiovascular: regular rate and rhythm, no murmurs Musculoskeletal: no deformity Skin:  no rash/petichiae; postprocedure dressings intact on neck (visualized) as well as dressing and groin (not visualized in parentheses Vascular:  Normal pulses all extremities  Neurologic Exam Mental Status: Awake and fully alert. Oriented to place and time. Recent and remote memory intact. Attention span, concentration and fund of knowledge appropriate. Mood and affect appropriate.  Cranial  Nerves: Fundoscopic exam reveals sharp disc margins. Pupils equal, briskly reactive to light. Extraocular movements full without nystagmus. Visual fields full to confrontation. Hearing intact. Facial sensation intact. Face, tongue, palate moves normally and symmetrically.  Motor: Normal bulk and tone.  Normal strength in all tested extremity muscles. Sensory.: intact to touch , pinprick , position and vibratory sensation.  Coordination: Rapid alternating movements normal in all extremities. Finger-to-nose and heel-to-shin performed accurately bilaterally. Gait and Station: Arises from chair with minimal difficulty. Stance is hunched. Gait demonstrates normal stride length and balance with assistance of rolling walker.  Reflexes: 1+ and symmetric. Toes downgoing.    NIHSS  0 Modified Rankin  0   Diagnostic Data (Labs, Imaging, Testing)  Ct angio neck w or wo contrast Ct angio head w or wo contrast 10/20/17 IMPRESSION: No acute brain finding by CT. Advanced atherosclerotic disease at both carotid bifurcations. On the right, the luminal diameter is 1 mm or less in the ICA bulb region, consistent with 80% or greater stenosis. On the left, atherosclerotic disease at the distal bulb results in narrowing of the lumen to 1.5 mm in diameter, consistent with a 70% stenosis. Atherosclerotic change in both carotid siphon regions with estimated 50% stenosis in the siphon regions. Moderate stenosis of the proximal anterior cerebral artery on the right. No other focal large or medium vessel narrowing beyond the siphons. Atherosclerotic change at both vertebral artery origins. Estimated 50% stenosis at each vertebral artery origin, but sufficient patency beyond that.  MR brain wo contrast 10/20/17 IMPRESSION: No acute finding. Age related atrophy. Mild chronic small-vessel ischemic change.    ASSESSMENT: Jeffrey Kaiser is a 84 y.o. year old male here with TIA on 10/20/17 secondary to cough  related to presyncope episode in setting of stenosis of bilateral ICAs. Vascular risk factors include CAD, DM, HTN and HLD. Underwent TCAR on 07/11/48 without complication.  Patient is being seen today for hospital follow-up and overall is doing very well from a stroke standpoint.    PLAN: -Continue aspirin 81 mg daily and clopidogrel 75 mg daily  and Lipitor for secondary stroke prevention -f.u with Dr. Donzetta Matters as scheduled along with repeat of carotid ultrasound in 1 month -F/u with PCP regarding your HLD, HTN and DM management -continue to monitor BP at home -Continue home OT and recommended that daughter call agency to ensure PT has also been ordered -daughter will notify us if PT was ordered so order can be placed -Advised to continue to monitor -Dressings or postprocedure sites encourage patient to increase activity as tolerated along with maintain a healthy diet -Maintain strict control of hypertension with blood pressure goal below 130/90, diabetes with hemoglobin A1c goal below 6.5% and cholesterol with LDL cholesterol (bad cholesterol) goal below 70 mg/dL. I also advised the patient to eat a healthy diet with plenty of whole grains, cereals, fruits and vegetables, exercise regularly and maintain ideal body weight.  Follow up in 3 months or call earlier if needed   Greater than 50% of time during this 25 minute visit was spent on counseling,explanation of diagnosis of TIA, reviewing risk factor management of HLD, HTN and DM, planning of further management, discussion with patient and family and coordination of care    Venancio Poisson, [facility]  [facility] [address] [place], [place] [address]  Phone [phone] Fax [phone]

## 2017-11-25 ENCOUNTER — Other Ambulatory Visit: Payer: Self-pay | Admitting: Adult Health

## 2017-11-25 ENCOUNTER — Telehealth: Payer: Self-pay | Admitting: Adult Health

## 2017-11-25 DIAGNOSIS — G459 Transient cerebral ischemic attack, unspecified: Secondary | ICD-10-CM

## 2017-11-25 DIAGNOSIS — I2510 Atherosclerotic heart disease of native coronary artery without angina pectoris: Secondary | ICD-10-CM | POA: Diagnosis not present

## 2017-11-25 DIAGNOSIS — Z7984 Long term (current) use of oral hypoglycemic drugs: Secondary | ICD-10-CM | POA: Diagnosis not present

## 2017-11-25 DIAGNOSIS — M6281 Muscle weakness (generalized): Secondary | ICD-10-CM | POA: Diagnosis not present

## 2017-11-25 DIAGNOSIS — E119 Type 2 diabetes mellitus without complications: Secondary | ICD-10-CM | POA: Diagnosis not present

## 2017-11-25 DIAGNOSIS — Z48812 Encounter for surgical aftercare following surgery on the circulatory system: Secondary | ICD-10-CM | POA: Diagnosis not present

## 2017-11-25 DIAGNOSIS — I10 Essential (primary) hypertension: Secondary | ICD-10-CM | POA: Diagnosis not present

## 2017-11-25 NOTE — Telephone Encounter (Signed)
Pt daughter(on DPR-Haislip,Renee @[phone]) has called in response to additional information she stated NP Janett Billow wanted to know.  Pt does not have an order for PT.  Daughter provided [facility]'s phone and fax info if needed [phone].  Daughter has not requested a call back

## 2017-11-26 ENCOUNTER — Ambulatory Visit: Payer: Medicare Other | Admitting: Endocrinology

## 2017-11-27 DIAGNOSIS — Z48812 Encounter for surgical aftercare following surgery on the circulatory system: Secondary | ICD-10-CM | POA: Diagnosis not present

## 2017-11-27 DIAGNOSIS — I10 Essential (primary) hypertension: Secondary | ICD-10-CM | POA: Diagnosis not present

## 2017-11-27 DIAGNOSIS — Z7984 Long term (current) use of oral hypoglycemic drugs: Secondary | ICD-10-CM | POA: Diagnosis not present

## 2017-11-27 DIAGNOSIS — M6281 Muscle weakness (generalized): Secondary | ICD-10-CM | POA: Diagnosis not present

## 2017-11-27 DIAGNOSIS — E119 Type 2 diabetes mellitus without complications: Secondary | ICD-10-CM | POA: Diagnosis not present

## 2017-11-27 DIAGNOSIS — I2510 Atherosclerotic heart disease of native coronary artery without angina pectoris: Secondary | ICD-10-CM | POA: Diagnosis not present

## 2017-11-30 DIAGNOSIS — M6281 Muscle weakness (generalized): Secondary | ICD-10-CM | POA: Diagnosis not present

## 2017-11-30 DIAGNOSIS — I10 Essential (primary) hypertension: Secondary | ICD-10-CM | POA: Diagnosis not present

## 2017-11-30 DIAGNOSIS — E119 Type 2 diabetes mellitus without complications: Secondary | ICD-10-CM | POA: Diagnosis not present

## 2017-11-30 DIAGNOSIS — Z7984 Long term (current) use of oral hypoglycemic drugs: Secondary | ICD-10-CM | POA: Diagnosis not present

## 2017-11-30 DIAGNOSIS — I2510 Atherosclerotic heart disease of native coronary artery without angina pectoris: Secondary | ICD-10-CM | POA: Diagnosis not present

## 2017-11-30 DIAGNOSIS — Z48812 Encounter for surgical aftercare following surgery on the circulatory system: Secondary | ICD-10-CM | POA: Diagnosis not present

## 2017-12-01 ENCOUNTER — Ambulatory Visit (INDEPENDENT_AMBULATORY_CARE_PROVIDER_SITE_OTHER): Payer: Medicare Other | Admitting: Cardiovascular Disease

## 2017-12-01 ENCOUNTER — Encounter: Payer: Self-pay | Admitting: Cardiovascular Disease

## 2017-12-01 VITALS — BP 168/72 | HR 92 | Ht 65.0 in | Wt 142.0 lb

## 2017-12-01 DIAGNOSIS — E7849 Other hyperlipidemia: Secondary | ICD-10-CM | POA: Diagnosis not present

## 2017-12-01 DIAGNOSIS — I779 Disorder of arteries and arterioles, unspecified: Secondary | ICD-10-CM

## 2017-12-01 DIAGNOSIS — I739 Peripheral vascular disease, unspecified: Secondary | ICD-10-CM | POA: Diagnosis not present

## 2017-12-01 DIAGNOSIS — I2510 Atherosclerotic heart disease of native coronary artery without angina pectoris: Secondary | ICD-10-CM

## 2017-12-01 DIAGNOSIS — I6523 Occlusion and stenosis of bilateral carotid arteries: Secondary | ICD-10-CM | POA: Diagnosis not present

## 2017-12-01 DIAGNOSIS — I10 Essential (primary) hypertension: Secondary | ICD-10-CM

## 2017-12-01 DIAGNOSIS — Z79899 Other long term (current) drug therapy: Secondary | ICD-10-CM

## 2017-12-01 MED ORDER — LOSARTAN POTASSIUM 25 MG PO TABS: 25.0000 mg | ORAL_TABLET | Freq: Every day | ORAL | 3 refills | Status: DC

## 2017-12-01 NOTE — Patient Instructions (Signed)
Medication Instructions:  Your physician has recommended you make the following change in your medication:  1) STOP Asprin 81 mg tablet   2) START Losartan 25 mg tablet by mouth ONCE daily   Labwork: Your physician recommends that you return for lab work in: 1 week - (BMET)   Testing/Procedures:  none  Follow-Up: Your physician recommends that you schedule a follow-up appointment in: 4 months with Dr. Fletcher Anon.   Any Other Special Instructions Will Be Listed Below (If Applicable).     If you need a refill on your cardiac medications before your next appointment, please call your pharmacy.

## 2017-12-01 NOTE — Progress Notes (Signed)
Cardiology Office Note   Date:  12/01/2017   ID:  Jeffrey Kaiser, Jeffrey Kaiser [birth year], MRN [id number]  PCP:  [name], MD  Cardiologist:   [name], MD   Chief Complaint  Patient presents with  . Follow-up    pt denied chest pain      History of Present Illness: Jeffrey Kaiser is a 84 y.o. male who presents for a follow up visit regarding peripheral arterial disease and claudication. He has reported history of coronary artery disease with no available details, prolonged history of diabetes, hyperlipidemia, hypertension and arthritis. He has no history of tobacco use.  He was seen in 2016 for severe left calf claudication. Noninvasive vascular evaluation showed an ABI of 0.93 on the right and 0.31 on the left.  Angiography in 7/16  showed: 1. No significant aortoiliac disease. 2. Moderate distal left common femoral artery stenosis. Mild nonobstructive disease in the left SFA. Subtotal occlusion of the left popliteal artery proximally with 1 vessel runoff below the knee. 3. Mild nonobstructive disease affecting the right SFA and popliteal arteries. 2 vessel runoff below the knee on the right. The patient was treated medically with the addition of cilostazol with significant improvement in symptoms.   The patient had a recent TIA and was diagnosed with severe bilateral carotid disease.  He underwent TCAR on right carotid artery by Dr. Donzetta Matters.  He had a small postoperative hematoma that resolved since then.  He has been doing well with no recent chest pain or shortness of breath.  His blood pressure has been running significantly elevated over the last few months.  He denies any leg pain.  Past Medical History:  Diagnosis Date  . Anemia 09/04/2010  . Arthritis   . BPH (benign prostatic hyperplasia) 09/09/2010  . CAD (coronary artery disease)   . DDD (degenerative disc disease), cervical 09/04/2010  . Diabetes mellitus   . Diastolic dysfunction 7/78/2423  . Diverticulitis   .  Glaucoma   . Hernia   . History of colonic diverticulitis 09/04/2010  . Hyperlipidemia   . Hypertension   . Psoriasis 09/04/2010  . Stroke [facility])     Past Surgical History:  Procedure Laterality Date  . broken legs  1981 and 1995  . PERIPHERAL VASCULAR CATHETERIZATION N/A 10/25/2014   Procedure: Abdominal Aortogram;  Surgeon: Wellington Hampshire, MD;  Location: [place] [facility];  Service: Cardiovascular;  Laterality: N/A;  . PERIPHERAL VASCULAR CATHETERIZATION Bilateral 10/25/2014   Procedure: Lower Extremity Angiography;  Surgeon: Wellington Hampshire, MD;  Location: [place] [facility];  Service: Cardiovascular;  Laterality: Bilateral;  . s/p lipoma  2003  . TONSILLECTOMY  1952  . TRANSCAROTID ARTERY REVASCULARIZATION Right 11/12/2017   Procedure: TRANSCAROTID ARTERY REVASCULARIZATION;  Surgeon: Waynetta Sandy, MD;  Location: [place];  Service: Vascular;  Laterality: Right;     Current Outpatient Medications  Medication Sig Dispense Refill  . acetaminophen (TYLENOL) 500 MG tablet Take 500 mg by mouth every 6 (six) hours as needed for mild pain.    Marland Kitchen aspirin EC 81 MG tablet Take 81 mg by mouth daily.    Marland Kitchen atorvastatin (LIPITOR) 20 MG tablet TAKE 1 TABLET (20 MG TOTAL) BY MOUTH DAILY. 90 tablet 2  . bromocriptine (PARLODEL) 2.5 MG tablet TAKE 1/2 TABLET AT BEDTIME 45 tablet 0  . ciclopirox (LOPROX) 0.77 % cream Apply 1 application topically daily.     . cilostazol (PLETAL) 100 MG tablet TAKE 1 TABLET BY MOUTH  TWICE A DAY 180 tablet 1  . clobetasol (TEMOVATE) 0.05 % external solution Apply topically 2 (two) times daily. 50 mL 1  . clopidogrel (PLAVIX) 75 MG tablet Take 1 tablet (75 mg total) by mouth daily. 30 tablet 6  . glucose blood (ONETOUCH VERIO) test strip 1 each by Other route daily. And lancets 1/day 250.00 100 each 12  . isosorbide mononitrate (IMDUR) 60 MG 24 hr tablet Take 0.5 tablets (30 mg total) by mouth daily. Must keep scheduled appt for future refills 15 tablet 0  .  metFORMIN (GLUCOPHAGE-XR) 500 MG 24 hr tablet Take 2 tablets (1,000 mg total) by mouth 2 (two) times daily. 120 tablet 11  . metoprolol succinate (TOPROL-XL) 50 MG 24 hr tablet TAKE 1 TABLET BY MOUTH DAILY WITH OR IMMEDIATELY FOLLOWING A MEAL 90 tablet 2  . repaglinide (PRANDIN) 1 MG tablet Take 1 tablet (1 mg total) by mouth 3 (three) times daily before meals. 90 tablet 11  . tiZANidine (ZANAFLEX) 2 MG tablet Take 1 tablet (2 mg total) by mouth every 6 (six) hours as needed for muscle spasms. 60 tablet 1  . TRAVATAN Z 0.004 % SOLN ophthalmic solution Place 1 drop into both eyes at bedtime.  4   No current facility-administered medications for this visit.     Allergies:   Levaquin [levofloxacin in d5w] and Tramadol    Social History:  The patient  reports that he has never smoked. He has never used smokeless tobacco. He reports that he does not drink alcohol or use drugs.   Family History:  The patient's family history includes Arthritis in his father and mother; Cancer in his other; Heart disease in his father and mother; Hypertension in his father and mother.    ROS:  Please see the history of present illness.   Otherwise, review of systems are positive for none.   All other systems are reviewed and negative.    PHYSICAL EXAM: VS:  BP (!) 168/72   Pulse 92   Ht 5\' 5"  (1.651 m)   Wt 142 lb (64.4 kg)   SpO2 96%   BMI 23.63 kg/m  , BMI Body mass index is 23.63 kg/m. GEN: Well nourished, well developed, in no acute distress  HEENT: normal  Neck: no JVD,  or masses Cardiac: RRR; no murmurs, rubs, or gallops,no edema  Respiratory:  clear to auscultation bilaterally, normal work of breathing GI: soft, nontender, nondistended, + BS MS: no deformity or atrophy  Skin: warm and dry, no rash Neuro:  Strength and sensation are intact Psych: euthymic mood, full affect Distal pulses are not palpable.   EKG:  EKG is  Not ordered today.   Recent Labs: 08/27/2017: TSH 2.53 11/12/2017: ALT  11 11/14/2017: BUN 14; Creatinine, Ser 0.76; Hemoglobin 9.2; Platelets 138; Potassium 3.8; Sodium 140    Lipid Panel    Component Value Date/Time   CHOL 128 10/21/2017 0408   TRIG 91 10/21/2017 0408   HDL 36 (L) 10/21/2017 0408   CHOLHDL 3.6 10/21/2017 0408   VLDL 18 10/21/2017 0408   LDLCALC 74 10/21/2017 0408   LDLDIRECT 114.4 05/13/2012 1417      Wt Readings from Last 3 Encounters:  12/01/17 142 lb (64.4 kg)  11/24/17 143 lb (64.9 kg)  11/23/17 146 lb (66.2 kg)       ASSESSMENT AND PLAN:  1. Peripheral arterial disease with claudication:  He Denies any claudication at the present time.  No evidence of lower extremity ulceration.  Continue medical therapy with cilostazol.  Given that he is also on Plavix, I elected to stop aspirin to minimize the risk of bleeding.  2. Hyperlipidemia: Continue treatment with atorvastatin with a target LDL of less than 70.  3. Coronary artery disease involving native coronary arteries without angina: Continue medical therapy.   4. Essential hypertension: Blood pressure has been running significantly elevated over the last few months.  I elected to add losartan 25 mg daily.  Check basic metabolic profile in 1 week.  5.  Bilateral carotid disease status post revascularization on the right side.  Followed by [facility].  Disposition:   FU with me in 4 months  Signed,  [name], MD  12/01/2017 9:42 AM    [place]

## 2017-12-02 DIAGNOSIS — I10 Essential (primary) hypertension: Secondary | ICD-10-CM | POA: Diagnosis not present

## 2017-12-02 DIAGNOSIS — M6281 Muscle weakness (generalized): Secondary | ICD-10-CM | POA: Diagnosis not present

## 2017-12-02 DIAGNOSIS — Z7984 Long term (current) use of oral hypoglycemic drugs: Secondary | ICD-10-CM | POA: Diagnosis not present

## 2017-12-02 DIAGNOSIS — Z48812 Encounter for surgical aftercare following surgery on the circulatory system: Secondary | ICD-10-CM | POA: Diagnosis not present

## 2017-12-02 DIAGNOSIS — E119 Type 2 diabetes mellitus without complications: Secondary | ICD-10-CM | POA: Diagnosis not present

## 2017-12-02 DIAGNOSIS — I2510 Atherosclerotic heart disease of native coronary artery without angina pectoris: Secondary | ICD-10-CM | POA: Diagnosis not present

## 2017-12-03 DIAGNOSIS — E119 Type 2 diabetes mellitus without complications: Secondary | ICD-10-CM | POA: Diagnosis not present

## 2017-12-03 DIAGNOSIS — Z48812 Encounter for surgical aftercare following surgery on the circulatory system: Secondary | ICD-10-CM | POA: Diagnosis not present

## 2017-12-03 DIAGNOSIS — I10 Essential (primary) hypertension: Secondary | ICD-10-CM | POA: Diagnosis not present

## 2017-12-03 DIAGNOSIS — I2510 Atherosclerotic heart disease of native coronary artery without angina pectoris: Secondary | ICD-10-CM | POA: Diagnosis not present

## 2017-12-03 DIAGNOSIS — Z7984 Long term (current) use of oral hypoglycemic drugs: Secondary | ICD-10-CM | POA: Diagnosis not present

## 2017-12-03 DIAGNOSIS — M6281 Muscle weakness (generalized): Secondary | ICD-10-CM | POA: Diagnosis not present

## 2017-12-08 DIAGNOSIS — Z79899 Other long term (current) drug therapy: Secondary | ICD-10-CM | POA: Diagnosis not present

## 2017-12-08 DIAGNOSIS — I10 Essential (primary) hypertension: Secondary | ICD-10-CM | POA: Diagnosis not present

## 2017-12-08 LAB — BASIC METABOLIC PANEL
BUN/Creatinine Ratio: 18 (ref 10–24)
BUN: 19 mg/dL (ref 10–36)
CALCIUM: 9.9 mg/dL (ref 8.6–10.2)
CO2: 26 mmol/L (ref 20–29)
Chloride: 105 mmol/L (ref 96–106)
Creatinine, Ser: 1.04 mg/dL (ref 0.76–1.27)
GFR calc non Af Amer: 63 mL/min/{1.73_m2} (ref >59)
GFR, EST AFRICAN AMERICAN: 73 mL/min/{1.73_m2} (ref >59)
Glucose: 121 mg/dL — ABNORMAL HIGH (ref 65–99)
POTASSIUM: 4.7 mmol/L (ref 3.5–5.2)
Sodium: 142 mmol/L (ref 134–144)

## 2017-12-11 ENCOUNTER — Ambulatory Visit (INDEPENDENT_AMBULATORY_CARE_PROVIDER_SITE_OTHER): Payer: Self-pay | Admitting: Vascular Surgery

## 2017-12-11 ENCOUNTER — Other Ambulatory Visit: Payer: Self-pay

## 2017-12-11 ENCOUNTER — Ambulatory Visit (HOSPITAL_COMMUNITY)
Admit: 2017-12-11 | Discharge: 2017-12-11 | Disposition: A | Discharge status: Home / Self Care | Payer: Medicare Other | Attending: Vascular Surgery | Admitting: Vascular Surgery

## 2017-12-11 ENCOUNTER — Encounter: Payer: Self-pay | Admitting: Vascular Surgery

## 2017-12-11 VITALS — BP 187/71 | HR 80 | Resp 20 | Ht 65.0 in | Wt 144.9 lb

## 2017-12-11 DIAGNOSIS — I6523 Occlusion and stenosis of bilateral carotid arteries: Secondary | ICD-10-CM | POA: Insufficient documentation

## 2017-12-11 NOTE — Progress Notes (Signed)
  Subjective:     Patient ID: Jeffrey Kaiser, male   DOB: May 22, 1925, 84 y.o.   MRN: [id number]  HPI Jeffrey Kaiser returns after right sided trans-carotid artery stenting.  He was initially evaluated with left facial droop in the hospital found to have CT angios demonstrating high-grade stenosis bilaterally.  Since that the car he has done well save for some elevated blood pressures.  Immediately postoperatively he was actually hypotensive required dopamine and pseudoephedrine.  He also was reevaluated in the emergency department for drainage from his wound which is now subsided.  At this time he is having no further neurologic complaints and his wound is healed well.   Review of Systems No complaints today    Objective:   Physical Exam Awake alert oriented Right neck incision clean dry intact Neurologically intact  I have independently interpreted his duplex which demonstrates 1 to 39% stenosis bilaterally    Assessment:     84 year old male status post right trans-carotid artery stenting.    Plan:     Previously he demonstrated high-grade stenosis of his left ICA and this is not redemonstrated today on duplex.  He is asymptomatic on the left side so we would not pursue intervention unless absolutely necessary.  He will continue Plavix until at least 3 months.  He will follow-up in 6 months with repeat carotid duplex.  We discussed signs and symptoms of stroke he demonstrates good understanding.  Brandon C. Donzetta Matters, MD [facility] Office: [phone] Pager: [phone]

## 2017-12-22 ENCOUNTER — Encounter: Payer: Self-pay | Admitting: Endocrinology

## 2017-12-22 ENCOUNTER — Ambulatory Visit (INDEPENDENT_AMBULATORY_CARE_PROVIDER_SITE_OTHER): Payer: Medicare Other | Admitting: Endocrinology

## 2017-12-22 ENCOUNTER — Encounter

## 2017-12-22 VITALS — BP 140/68 | HR 86 | Ht 65.0 in | Wt 145.0 lb

## 2017-12-22 DIAGNOSIS — I6523 Occlusion and stenosis of bilateral carotid arteries: Secondary | ICD-10-CM | POA: Diagnosis not present

## 2017-12-22 DIAGNOSIS — E1151 Type 2 diabetes mellitus with diabetic peripheral angiopathy without gangrene: Secondary | ICD-10-CM

## 2017-12-22 LAB — POCT GLYCOSYLATED HEMOGLOBIN (HGB A1C): Hemoglobin A1C: 5.7 % — AB (ref 4.0–5.6)

## 2017-12-22 MED ORDER — REPAGLINIDE 0.5 MG PO TABS: 0.5000 mg | ORAL_TABLET | Freq: Three times a day (TID) | ORAL | 11 refills | Status: DC

## 2017-12-22 NOTE — Progress Notes (Signed)
Subjective:    Patient ID: Jeffrey Kaiser, male    DOB: [birth year], 84 y.o.   MRN: [id number]  HPI Pt returns for f/u of diabetes mellitus:  DM type: 2 Dx'ed: 9518 Complications: retinopathy, renal insufficiency, CAD, and PAD.  Therapy: 3 oral meds.  DKA: never.   Severe hypoglycemia: never.  Pancreatitis: never.   Other: he has never been on insulin; invokana rx is limited by hyperkalemia.   Interval history: no cbg record, but states cbg's vary from 74-200.  pt states he feels well in general.  Past Medical History:  Diagnosis Date  . Anemia 09/04/2010  . Arthritis   . BPH (benign prostatic hyperplasia) 09/09/2010  . CAD (coronary artery disease)   . DDD (degenerative disc disease), cervical 09/04/2010  . Diabetes mellitus   . Diastolic dysfunction 8/41/6606  . Diverticulitis   . Glaucoma   . Hernia   . History of colonic diverticulitis 09/04/2010  . Hyperlipidemia   . Hypertension   . Psoriasis 09/04/2010  . Stroke [facility])     Past Surgical History:  Procedure Laterality Date  . broken legs  1981 and 1995  . PERIPHERAL VASCULAR CATHETERIZATION N/A 10/25/2014   Procedure: Abdominal Aortogram;  Surgeon: Wellington Hampshire, MD;  Location: [place] [facility];  Service: Cardiovascular;  Laterality: N/A;  . PERIPHERAL VASCULAR CATHETERIZATION Bilateral 10/25/2014   Procedure: Lower Extremity Angiography;  Surgeon: Wellington Hampshire, MD;  Location: [place] [facility];  Service: Cardiovascular;  Laterality: Bilateral;  . s/p lipoma  2003  . TONSILLECTOMY  1952  . TRANSCAROTID ARTERY REVASCULARIZATION Right 11/12/2017   Procedure: TRANSCAROTID ARTERY REVASCULARIZATION;  Surgeon: Waynetta Sandy, MD;  Location: [place];  Service: Vascular;  Laterality: Right;    Social History   Socioeconomic History  . Marital status: Single    Spouse name: Not on file  . Number of children: Not on file  . Years of education: Not on file  . Highest education level: Not on file    Occupational History  . Occupation: retired  Scientific laboratory technician  . Financial resource strain: Not on file  . Food insecurity:    Worry: Not on file    Inability: Not on file  . Transportation needs:    Medical: Not on file    Non-medical: Not on file  Tobacco Use  . Smoking status: Never Smoker  . Smokeless tobacco: Never Used  Substance and Sexual Activity  . Alcohol use: No  . Drug use: No  . Sexual activity: Never  Lifestyle  . Physical activity:    Days per week: Not on file    Minutes per session: Not on file  . Stress: Not on file  Relationships  . Social connections:    Talks on phone: Not on file    Gets together: Not on file    Attends religious service: Not on file    Active member of club or organization: Not on file    Attends meetings of clubs or organizations: Not on file    Relationship status: Not on file  . Intimate partner violence:    Fear of current or ex partner: Not on file    Emotionally abused: Not on file    Physically abused: Not on file    Forced sexual activity: Not on file  Other Topics Concern  . Not on file  Social History Narrative  . Not on file    Current Outpatient Medications on File Prior to Visit  Medication Sig Dispense Refill  . acetaminophen (TYLENOL) 500 MG tablet Take 500 mg by mouth every 6 (six) hours as needed for mild pain.    Marland Kitchen atorvastatin (LIPITOR) 20 MG tablet TAKE 1 TABLET (20 MG TOTAL) BY MOUTH DAILY. 90 tablet 2  . bromocriptine (PARLODEL) 2.5 MG tablet TAKE 1/2 TABLET AT BEDTIME 45 tablet 0  . ciclopirox (LOPROX) 0.77 % cream Apply 1 application topically daily.     . cilostazol (PLETAL) 100 MG tablet TAKE 1 TABLET BY MOUTH TWICE A DAY 180 tablet 1  . clobetasol (TEMOVATE) 0.05 % external solution Apply topically 2 (two) times daily. 50 mL 1  . clopidogrel (PLAVIX) 75 MG tablet Take 1 tablet (75 mg total) by mouth daily. 30 tablet 6  . glucose blood (ONETOUCH VERIO) test strip 1 each by Other route daily. And lancets  1/day 250.00 100 each 12  . isosorbide mononitrate (IMDUR) 60 MG 24 hr tablet Take 0.5 tablets (30 mg total) by mouth daily. Must keep scheduled appt for future refills 15 tablet 0  . losartan (COZAAR) 25 MG tablet Take 1 tablet (25 mg total) by mouth daily. 90 tablet 3  . metFORMIN (GLUCOPHAGE-XR) 500 MG 24 hr tablet Take 2 tablets (1,000 mg total) by mouth 2 (two) times daily. 120 tablet 11  . metoprolol succinate (TOPROL-XL) 50 MG 24 hr tablet TAKE 1 TABLET BY MOUTH DAILY WITH OR IMMEDIATELY FOLLOWING A MEAL 90 tablet 2  . tiZANidine (ZANAFLEX) 2 MG tablet Take 1 tablet (2 mg total) by mouth every 6 (six) hours as needed for muscle spasms. 60 tablet 1  . TRAVATAN Z 0.004 % SOLN ophthalmic solution Place 1 drop into both eyes at bedtime.  4   No current facility-administered medications on file prior to visit.     Allergies  Allergen Reactions  . Levaquin [Levofloxacin In D5w] Nausea Only  . Tramadol Other (See Comments)    somnolence    Family History  Problem Relation Age of Onset  . Arthritis Mother   . Heart disease Mother   . Hypertension Mother   . Arthritis Father   . Heart disease Father   . Hypertension Father   . Cancer Other        lung cancer    BP 140/68 (BP Location: Left Arm)   Pulse 86   Ht 5\' 5"  (1.651 m)   Wt 145 lb (65.8 kg)   SpO2 99%   BMI 24.13 kg/m    Review of Systems He denies hypoglycemia    Objective:   Physical Exam VITAL SIGNS:  See vs page.  GENERAL: no distress.  Pulses: foot pulses are intact bilaterally.   MSK: no deformity of the feet or ankles.  CV: trace bilat edema of the legs, and bilat vv's.  Skin:  no ulcer on the feet or ankles.  normal color and temp on the feet and ankles Neuro: sensation is intact to touch on the feet and ankles.   Ext: There is bilateral onychomycosis of the toenails.    Lab Results  Component Value Date   CREATININE 1.04 12/08/2017   BUN 19 12/08/2017   NA 142 12/08/2017   K 4.7 12/08/2017    CL 105 12/08/2017   CO2 26 12/08/2017      Assessment & Plan:  Type 2 DM, with PAD: overcontrolled.  Renal failure: this limits rx options  Patient Instructions  check your blood sugar once a day.  vary the time of day when  you check, between before the 3 meals, and at bedtime.  also check if you have symptoms of your blood sugar being too high or too low.  please keep a record of the readings and bring it to your next appointment here.  You can write it on any piece of paper.  please call us sooner if your blood sugar goes below 70, or if you have a lot of readings over 200.   I have sent a prescription to your pharmacy, to reduce the repaglinide to 0.5 mg 3 times a day (just before each meal)  Please come back for a follow-up appointment in 2 months.

## 2017-12-22 NOTE — Patient Instructions (Addendum)
check your blood sugar once a day.  vary the time of day when you check, between before the 3 meals, and at bedtime.  also check if you have symptoms of your blood sugar being too high or too low.  please keep a record of the readings and bring it to your next appointment here.  You can write it on any piece of paper.  please call us sooner if your blood sugar goes below 70, or if you have a lot of readings over 200.   I have sent a prescription to your pharmacy, to reduce the repaglinide to 0.5 mg 3 times a day (just before each meal)  Please come back for a follow-up appointment in 2 months.

## 2017-12-30 ENCOUNTER — Other Ambulatory Visit: Payer: Self-pay | Admitting: Internal Medicine

## 2018-01-12 ENCOUNTER — Other Ambulatory Visit: Payer: Self-pay | Admitting: Internal Medicine

## 2018-01-12 MED ORDER — ATORVASTATIN CALCIUM 20 MG PO TABS: ORAL_TABLET | ORAL | 2 refills | Status: DC

## 2018-01-12 NOTE — Telephone Encounter (Signed)
Copied from [phone]. Topic: Quick Communication - Rx Refill/Question >> Jan 12, 2018  3:52 PM Cecelia Byars, NT wrote:  Medication:  atorvastatin (LIPITOR) 20 MG tablet  (Agent: If no, request that the patient contact the pharmacy for the refill.yes  (Agent: If yes, when and what did the pharmacy advise?  Preferred Pharmacy (with phone number or street name [id number] [place], [place] [phone] (Phone) [phone] (Fax)    Agent: Please be advised that RX refills may take up to 3 business days. We ask that you follow-up with your pharmacy.

## 2018-01-21 DIAGNOSIS — H401132 Primary open-angle glaucoma, bilateral, moderate stage: Secondary | ICD-10-CM | POA: Diagnosis not present

## 2018-01-21 DIAGNOSIS — H53431 Sector or arcuate defects, right eye: Secondary | ICD-10-CM | POA: Diagnosis not present

## 2018-01-22 ENCOUNTER — Encounter: Payer: Self-pay | Admitting: Internal Medicine

## 2018-01-22 ENCOUNTER — Ambulatory Visit (INDEPENDENT_AMBULATORY_CARE_PROVIDER_SITE_OTHER): Payer: Medicare Other | Admitting: Internal Medicine

## 2018-01-22 VITALS — BP 140/82 | HR 78 | Temp 97.8°F | Ht 65.0 in | Wt 149.0 lb

## 2018-01-22 DIAGNOSIS — E7849 Other hyperlipidemia: Secondary | ICD-10-CM

## 2018-01-22 DIAGNOSIS — Z23 Encounter for immunization: Secondary | ICD-10-CM

## 2018-01-22 DIAGNOSIS — I2510 Atherosclerotic heart disease of native coronary artery without angina pectoris: Secondary | ICD-10-CM | POA: Diagnosis not present

## 2018-01-22 DIAGNOSIS — I6523 Occlusion and stenosis of bilateral carotid arteries: Secondary | ICD-10-CM | POA: Diagnosis not present

## 2018-01-22 DIAGNOSIS — G459 Transient cerebral ischemic attack, unspecified: Secondary | ICD-10-CM | POA: Diagnosis not present

## 2018-01-22 DIAGNOSIS — I10 Essential (primary) hypertension: Secondary | ICD-10-CM

## 2018-01-22 NOTE — Progress Notes (Signed)
Subjective:    Patient ID: Jeffrey Kaiser, male    DOB: 03-Oct-1925, 84 y.o.   MRN: [id number]  HPI  Here to f/u; overall doing ok,  Pt denies chest pain, increasing sob or doe, wheezing, orthopnea, PND, increased LE swelling, palpitations, dizziness or syncope.  Pt denies new neurological symptoms such as new headache, or facial or extremity weakness or numbness.  Pt denies polydipsia, polyuria, or low sugar episode.  Pt states overall good compliance with meds, mostly trying to follow appropriate diet, with wt overall stable,  but little exercise however.  Sees endo for DM.   Pt denies fever, wt loss, night sweats, loss of appetite, or other constitutional symptoms.  No new complaints Past Medical History:  Diagnosis Date  . Anemia 09/04/2010  . Arthritis   . BPH (benign prostatic hyperplasia) 09/09/2010  . CAD (coronary artery disease)   . DDD (degenerative disc disease), cervical 09/04/2010  . Diabetes mellitus   . Diastolic dysfunction 10/01/348  . Diverticulitis   . Glaucoma   . Hernia   . History of colonic diverticulitis 09/04/2010  . Hyperlipidemia   . Hypertension   . Psoriasis 09/04/2010  . Stroke [facility])    Past Surgical History:  Procedure Laterality Date  . broken legs  1981 and 1995  . PERIPHERAL VASCULAR CATHETERIZATION N/A 10/25/2014   Procedure: Abdominal Aortogram;  Surgeon: Wellington Hampshire, MD;  Location: [place] [facility];  Service: Cardiovascular;  Laterality: N/A;  . PERIPHERAL VASCULAR CATHETERIZATION Bilateral 10/25/2014   Procedure: Lower Extremity Angiography;  Surgeon: Wellington Hampshire, MD;  Location: [place] [facility];  Service: Cardiovascular;  Laterality: Bilateral;  . s/p lipoma  2003  . TONSILLECTOMY  1952  . TRANSCAROTID ARTERY REVASCULARIZATION Right 11/12/2017   Procedure: TRANSCAROTID ARTERY REVASCULARIZATION;  Surgeon: Waynetta Sandy, MD;  Location: [place];  Service: Vascular;  Laterality: Right;    reports that he has never smoked. He has  never used smokeless tobacco. He reports that he does not drink alcohol or use drugs. family history includes Arthritis in his father and mother; Cancer in his other; Heart disease in his father and mother; Hypertension in his father and mother. Allergies  Allergen Reactions  . Levaquin [Levofloxacin In D5w] Nausea Only  . Tramadol Other (See Comments)    somnolence   Current Outpatient Medications on File Prior to Visit  Medication Sig Dispense Refill  . acetaminophen (TYLENOL) 500 MG tablet Take 500 mg by mouth every 6 (six) hours as needed for mild pain.    Marland Kitchen atorvastatin (LIPITOR) 20 MG tablet TAKE 1 TABLET (20 MG TOTAL) BY MOUTH DAILY. 90 tablet 2  . bromocriptine (PARLODEL) 2.5 MG tablet TAKE 1/2 TABLET AT BEDTIME 45 tablet 0  . ciclopirox (LOPROX) 0.77 % cream Apply 1 application topically daily.     . cilostazol (PLETAL) 100 MG tablet TAKE 1 TABLET BY MOUTH TWICE A DAY 180 tablet 1  . clobetasol (TEMOVATE) 0.05 % external solution Apply topically 2 (two) times daily. 50 mL 1  . clopidogrel (PLAVIX) 75 MG tablet Take 1 tablet (75 mg total) by mouth daily. 30 tablet 6  . glucose blood (ONETOUCH VERIO) test strip 1 each by Other route daily. And lancets 1/day 250.00 100 each 12  . isosorbide mononitrate (IMDUR) 60 MG 24 hr tablet Take 0.5 tablets (30 mg total) by mouth daily. Keep Oct appt for future refills 15 tablet 0  . losartan (COZAAR) 25 MG tablet Take 1 tablet (25  mg total) by mouth daily. 90 tablet 3  . metFORMIN (GLUCOPHAGE-XR) 500 MG 24 hr tablet Take 2 tablets (1,000 mg total) by mouth 2 (two) times daily. 120 tablet 11  . metoprolol succinate (TOPROL-XL) 50 MG 24 hr tablet TAKE 1 TABLET BY MOUTH DAILY WITH OR IMMEDIATELY FOLLOWING A MEAL 90 tablet 2  . repaglinide (PRANDIN) 0.5 MG tablet Take 1 tablet (0.5 mg total) by mouth 3 (three) times daily before meals. 90 tablet 11  . tiZANidine (ZANAFLEX) 2 MG tablet Take 1 tablet (2 mg total) by mouth every 6 (six) hours as needed  for muscle spasms. 60 tablet 1  . TRAVATAN Z 0.004 % SOLN ophthalmic solution Place 1 drop into both eyes at bedtime.  4   No current facility-administered medications on file prior to visit.    Review of Systems  Constitutional: Negative for other unusual diaphoresis or sweats HENT: Negative for ear discharge or swelling Eyes: Negative for other worsening visual disturbances Respiratory: Negative for stridor or other swelling  Gastrointestinal: Negative for worsening distension or other blood Genitourinary: Negative for retention or other urinary change Musculoskeletal: Negative for other MSK pain or swelling Skin: Negative for color change or other new lesions Neurological: Negative for worsening tremors and other numbness  Psychiatric/Behavioral: Negative for worsening agitation or other fatigue All other system neg per pt    Objective:   Physical Exam BP 140/82   Pulse 78   Temp 97.8 F (36.6 C) (Oral)   Ht 5\' 5"  (1.651 m)   Wt 149 lb (67.6 kg)   SpO2 97%   BMI 24.79 kg/m  VS noted,  Constitutional: Pt appears in NAD HENT: Head: NCAT.  Right Ear: External ear normal.  Left Ear: External ear normal.  Eyes: . Pupils are equal, round, and reactive to light. Conjunctivae and EOM are normal Nose: without d/c or deformity Neck: Neck supple. Gross normal ROM Cardiovascular: Normal rate and regular rhythm.   Pulmonary/Chest: Effort normal and breath sounds without rales or wheezing.  Neurological: Pt is alert. At baseline orientation, motor grossly intact Skin: Skin is warm. No rashes, other new lesions, no LE edema Psychiatric: Pt behavior is normal without agitation  No other exam findings  Lab Results  Component Value Date   WBC 6.7 11/14/2017   HGB 9.2 (L) 11/14/2017   HCT 29.2 (L) 11/14/2017   PLT 138 (L) 11/14/2017   GLUCOSE 121 (H) 12/08/2017   CHOL 128 10/21/2017   TRIG 91 10/21/2017   HDL 36 (L) 10/21/2017   LDLDIRECT 114.4 05/13/2012   LDLCALC 74  10/21/2017   ALT 11 11/12/2017   AST 22 11/12/2017   NA 142 12/08/2017   K 4.7 12/08/2017   CL 105 12/08/2017   CREATININE 1.04 12/08/2017   BUN 19 12/08/2017   CO2 26 12/08/2017   TSH 2.53 08/27/2017   INR 1.05 11/12/2017   HGBA1C 5.7 (A) 12/22/2017   MICROALBUR 1.5 10/05/2014       Assessment & Plan:

## 2018-01-22 NOTE — Patient Instructions (Signed)
You had the flu shot today  Please continue all other medications as before, and refills have been done if requested.  Please have the pharmacy call with any other refills you may need.  Please continue your efforts at being more active, low cholesterol diet, and weight control.  You are otherwise up to date with prevention measures today.  Please keep your appointments with your specialists as you may have planned  No further lab work needed today  Please return in 6 months, or sooner if needed 

## 2018-01-23 NOTE — Assessment & Plan Note (Signed)
stable overall by history and exam, recent data reviewed with pt, and pt to continue medical treatment as before,  to f/u any worsening symptoms or concerns  

## 2018-02-04 ENCOUNTER — Other Ambulatory Visit: Payer: Self-pay | Admitting: Internal Medicine

## 2018-02-22 ENCOUNTER — Ambulatory Visit (INDEPENDENT_AMBULATORY_CARE_PROVIDER_SITE_OTHER): Payer: Medicare Other | Admitting: Endocrinology

## 2018-02-22 ENCOUNTER — Encounter: Payer: Self-pay | Admitting: Endocrinology

## 2018-02-22 VITALS — BP 148/82 | HR 95 | Ht 65.0 in | Wt 148.2 lb

## 2018-02-22 DIAGNOSIS — E1151 Type 2 diabetes mellitus with diabetic peripheral angiopathy without gangrene: Secondary | ICD-10-CM | POA: Diagnosis not present

## 2018-02-22 DIAGNOSIS — I6523 Occlusion and stenosis of bilateral carotid arteries: Secondary | ICD-10-CM

## 2018-02-22 LAB — POCT GLYCOSYLATED HEMOGLOBIN (HGB A1C): Hemoglobin A1C: 6.8 % — AB (ref 4.0–5.6)

## 2018-02-22 MED ORDER — CLOTRIMAZOLE-BETAMETHASONE 1-0.05 % EX CREA: 1.0000 "application " | TOPICAL_CREAM | Freq: Three times a day (TID) | CUTANEOUS | 2 refills | Status: AC | PRN

## 2018-02-22 MED ORDER — REPAGLINIDE 0.5 MG PO TABS: 0.5000 mg | ORAL_TABLET | Freq: Every day | ORAL | 3 refills | Status: AC

## 2018-02-22 NOTE — Progress Notes (Signed)
Subjective:    Patient ID: Jeffrey Kaiser, male    DOB: 1926/02/21, 84 y.o.   MRN: [id number]  HPI Pt returns for f/u of diabetes mellitus:  DM type: 2 Dx'ed: 4270 Complications: retinopathy, renal insufficiency, CAD, and PAD.  Therapy: 3 oral meds.  DKA: never.   Severe hypoglycemia: never.  Pancreatitis: never.   Other: he has never been on insulin; invokana rx is limited by hyperkalemia.   Interval history: no cbg record, but states cbg's are in the low-100's.  He takes meds as rx'ed Past Medical History:  Diagnosis Date  . Anemia 09/04/2010  . Arthritis   . BPH (benign prostatic hyperplasia) 09/09/2010  . CAD (coronary artery disease)   . DDD (degenerative disc disease), cervical 09/04/2010  . Diabetes mellitus   . Diastolic dysfunction 09/28/7626  . Diverticulitis   . Glaucoma   . Hernia   . History of colonic diverticulitis 09/04/2010  . Hyperlipidemia   . Hypertension   . Psoriasis 09/04/2010  . Stroke [facility])     Past Surgical History:  Procedure Laterality Date  . broken legs  1981 and 1995  . PERIPHERAL VASCULAR CATHETERIZATION N/A 10/25/2014   Procedure: Abdominal Aortogram;  Surgeon: Wellington Hampshire, MD;  Location: [place] [facility];  Service: Cardiovascular;  Laterality: N/A;  . PERIPHERAL VASCULAR CATHETERIZATION Bilateral 10/25/2014   Procedure: Lower Extremity Angiography;  Surgeon: Wellington Hampshire, MD;  Location: [place] [facility];  Service: Cardiovascular;  Laterality: Bilateral;  . s/p lipoma  2003  . TONSILLECTOMY  1952  . TRANSCAROTID ARTERY REVASCULARIZATION Right 11/12/2017   Procedure: TRANSCAROTID ARTERY REVASCULARIZATION;  Surgeon: Waynetta Sandy, MD;  Location: [place];  Service: Vascular;  Laterality: Right;    Social History   Socioeconomic History  . Marital status: Single    Spouse name: Not on file  . Number of children: Not on file  . Years of education: Not on file  . Highest education level: Not on file  Occupational  History  . Occupation: retired  Scientific laboratory technician  . Financial resource strain: Not on file  . Food insecurity:    Worry: Not on file    Inability: Not on file  . Transportation needs:    Medical: Not on file    Non-medical: Not on file  Tobacco Use  . Smoking status: Never Smoker  . Smokeless tobacco: Never Used  Substance and Sexual Activity  . Alcohol use: No  . Drug use: No  . Sexual activity: Never  Lifestyle  . Physical activity:    Days per week: Not on file    Minutes per session: Not on file  . Stress: Not on file  Relationships  . Social connections:    Talks on phone: Not on file    Gets together: Not on file    Attends religious service: Not on file    Active member of club or organization: Not on file    Attends meetings of clubs or organizations: Not on file    Relationship status: Not on file  . Intimate partner violence:    Fear of current or ex partner: Not on file    Emotionally abused: Not on file    Physically abused: Not on file    Forced sexual activity: Not on file  Other Topics Concern  . Not on file  Social History Narrative  . Not on file    Current Outpatient Medications on File Prior to Visit  Medication Sig  Dispense Refill  . acetaminophen (TYLENOL) 500 MG tablet Take 500 mg by mouth every 6 (six) hours as needed for mild pain.    Marland Kitchen atorvastatin (LIPITOR) 20 MG tablet TAKE 1 TABLET (20 MG TOTAL) BY MOUTH DAILY. 90 tablet 2  . bromocriptine (PARLODEL) 2.5 MG tablet TAKE 1/2 TABLET AT BEDTIME 45 tablet 0  . ciclopirox (LOPROX) 0.77 % cream Apply 1 application topically daily.     . cilostazol (PLETAL) 100 MG tablet TAKE 1 TABLET BY MOUTH TWICE A DAY 180 tablet 1  . clobetasol (TEMOVATE) 0.05 % external solution Apply topically 2 (two) times daily. 50 mL 1  . clopidogrel (PLAVIX) 75 MG tablet Take 1 tablet (75 mg total) by mouth daily. 30 tablet 6  . glucose blood (ONETOUCH VERIO) test strip 1 each by Other route daily. And lancets 1/day 250.00  100 each 12  . isosorbide mononitrate (IMDUR) 60 MG 24 hr tablet TAKE 1/2 TABLET BY MOUTH DAILY 15 tablet 0  . losartan (COZAAR) 25 MG tablet Take 1 tablet (25 mg total) by mouth daily. 90 tablet 3  . metFORMIN (GLUCOPHAGE-XR) 500 MG 24 hr tablet Take 2 tablets (1,000 mg total) by mouth 2 (two) times daily. 120 tablet 11  . metoprolol succinate (TOPROL-XL) 50 MG 24 hr tablet TAKE 1 TABLET BY MOUTH DAILY WITH OR IMMEDIATELY FOLLOWING A MEAL 90 tablet 2  . tiZANidine (ZANAFLEX) 2 MG tablet Take 1 tablet (2 mg total) by mouth every 6 (six) hours as needed for muscle spasms. 60 tablet 1  . TRAVATAN Z 0.004 % SOLN ophthalmic solution Place 1 drop into both eyes at bedtime.  4   No current facility-administered medications on file prior to visit.     Allergies  Allergen Reactions  . Levaquin [Levofloxacin In D5w] Nausea Only  . Tramadol Other (See Comments)    somnolence    Family History  Problem Relation Age of Onset  . Arthritis Mother   . Heart disease Mother   . Hypertension Mother   . Arthritis Father   . Heart disease Father   . Hypertension Father   . Cancer Other        lung cancer    BP (!) 148/82 (BP Location: Left Arm, Patient Position: Sitting, Cuff Size: Normal)   Pulse 95   Ht 5\' 5"  (1.651 m)   Wt 148 lb 3.2 oz (67.2 kg)   SpO2 94%   BMI 24.66 kg/m    Review of Systems He denies hypoglycemia.  He has rash and itching at the ant neck.  He is unable to cite precip factor    Objective:   Physical Exam VITAL SIGNS:  See vs page.  GENERAL: no distress.  Pulses: foot pulses are intact bilaterally.   MSK: no deformity of the feet or ankles.  CV: 1+ bilat edema of the legs, and bilat vv's.  Skin:  no ulcer on the feet or ankles.  normal color and temp on the feet and ankles.   Neuro: sensation is intact to touch on the feet and ankles.   Ext: There is bilateral onychomycosis of the toenails.   Lab Results  Component Value Date   HGBA1C 6.8 (A) 02/22/2018    Lab Results  Component Value Date   CREATININE 1.04 12/08/2017   BUN 19 12/08/2017   NA 142 12/08/2017   K 4.7 12/08/2017   CL 105 12/08/2017   CO2 26 12/08/2017       Assessment & Plan:  Type 2 DM, with PAD: well-controlled Frail elderly state: he is not a candidate for aggressive glycemic control Rash, new, uncertain etiology.  Patient Instructions  I have sent a prescription to your pharmacy, for the rash check your blood sugar once a day.  vary the time of day when you check, between before the 3 meals, and at bedtime.  also check if you have symptoms of your blood sugar being too high or too low.  please keep a record of the readings and bring it to your next appointment here (or you can bring the meter itself).  You can write it on any piece of paper.  please call us sooner if your blood sugar goes below 70, or if you have a lot of readings over 200. I have sent a prescription to your pharmacy, to reduce the repaglinide to supper only Please continue the same other diabetes medications. Please come back for a follow-up appointment in 3 months.

## 2018-02-22 NOTE — Patient Instructions (Addendum)
I have sent a prescription to your pharmacy, for the rash check your blood sugar once a day.  vary the time of day when you check, between before the 3 meals, and at bedtime.  also check if you have symptoms of your blood sugar being too high or too low.  please keep a record of the readings and bring it to your next appointment here (or you can bring the meter itself).  You can write it on any piece of paper.  please call us sooner if your blood sugar goes below 70, or if you have a lot of readings over 200. I have sent a prescription to your pharmacy, to reduce the repaglinide to supper only Please continue the same other diabetes medications. Please come back for a follow-up appointment in 3 months.

## 2018-02-24 ENCOUNTER — Ambulatory Visit: Payer: Medicare Other | Admitting: Adult Health

## 2018-02-24 NOTE — Progress Notes (Deleted)
[facility] [address] [place]. [place] [address] [phone]       OFFICE FOLLOW UP NOTE  [name] Date of Birth:  [birth year] Medical Record Number:  [id number]   Reason for Referral:  hospital TIA follow up  CHIEF COMPLAINT:  No chief complaint on file.   HPI: Jeffrey Kaiser is being seen today for initial visit in the office for TIA due to cough related syncope episode in the setting of stenosis of bilateral ICAs on 10/20/2017. History obtained from patient, daughter, wife and chart review. Reviewed all radiology images and labs personally.  [name] is a 84 y.o. male with history of CAD, DM, HTN, HLD  who was admitted for presyncope episode after severe coughing followed by mild left facial droop. No tPA given due to rapidly resolving symptoms.  MRI head reviewed and was negative for acute stroke.  CTA head and neck showed right ICA bulb high-grade stenosis, left ICA bulb 70% stenosis, right VA origin atherosclerosis and bilateral ICA siphons high-grade stenosis.  2D echo showed EF of 55 to 60%.    LDL 74 and recommended continuation of Lipitor 20 mg.  HTN stable during admission and recommended BP goal of 1 30-1 50 due to bilateral ICA stenosis and avoidance of hypotension.  A1c 8.3 and recommended tight glycemic control with close PCP follow-up.  Patient was previously on aspirin 81 mg along with Pletal and recommended continuation of both.  Due to bilateral ICA stenosis, recommended referral to [facility] for close follow-up.  Patient was discharged home in stable condition. Patient followed up with vascular surgery on 11/06/2017 with recommendation of starting DAPT with aspirin and Plavix and patient underwent trans-carotid artery revascularization on 11/12/2017 in which he tolerated well without complication. Patient return to ED on 11/20/2017 after noticing bleeding and swelling from recent TCAR site.  It lasted approximately 1224 hrs. prior to patient  coming into emergency room.  Pressure dressing was applied and bleeding resolved.  Patient was discharged back home with recommendations of vascular surgery follow-up.  Patient did have follow-up appointment on 11/23/2017 with vascular surgery who assessed incision site without further recommendations.   11/24/2017 visit: Patient is being seen today for hospital follow-up and is accompanied by his daughter and wife.  Overall he is doing well from a stroke standpoint and is also recovering well from procedure.  His daughter continuously checks on dressings and states and no further bleeding has been observed.  Patient continues to take both aspirin and Plavix without side effects of bleeding or bruising.  Continues to take Lipitor without side effects myalgias.  Does monitor glucose levels at home in 14 days average 151.  Blood pressure today elevated at appointment at 163/74 but this is monitored at home and typically 130s over 60s.  Patient will be undergoing repeat carotid ultrasound in 1 month and does have appointment with vascular surgery on 12/11/2017.  Home therapies were recommended after procedure and so far only occupational therapy has started.  Daughter is unsure if physical therapy is starting and was recommended to call agency to ensure physical therapy has also been ordered as it typically is with any type of home therapy but if it was not order, advised daughter to call in order will be placed.  Patient is currently using rolling walker in which she did not use prior and daughter and wife both state that patient has had a lack of motivation to be ambulating or to be  active.  Highly encourage patient to gradually increase activities as recommended by vascular surgery in order to avoid deconditioning.  Denies new or worsening stroke/TIA symptoms.  Interval history 02/24/2018:    ROS:   14 system review of systems performed and negative with exception of swelling in legs, itching, diarrhea,  constipation, incontinence, feeling cold, joint pain, aching muscles, runny nose, skin sensitivity, weakness, difficulty swallowing, and insomnia  PMH:  Past Medical History:  Diagnosis Date  . Anemia 09/04/2010  . Arthritis   . BPH (benign prostatic hyperplasia) 09/09/2010  . CAD (coronary artery disease)   . DDD (degenerative disc disease), cervical 09/04/2010  . Diabetes mellitus   . Diastolic dysfunction 2/42/6834  . Diverticulitis   . Glaucoma   . Hernia   . History of colonic diverticulitis 09/04/2010  . Hyperlipidemia   . Hypertension   . Psoriasis 09/04/2010  . Stroke [facility])     PSH:  Past Surgical History:  Procedure Laterality Date  . broken legs  1981 and 1995  . PERIPHERAL VASCULAR CATHETERIZATION N/A 10/25/2014   Procedure: Abdominal Aortogram;  Surgeon: Wellington Hampshire, MD;  Location: [place] [facility];  Service: Cardiovascular;  Laterality: N/A;  . PERIPHERAL VASCULAR CATHETERIZATION Bilateral 10/25/2014   Procedure: Lower Extremity Angiography;  Surgeon: Wellington Hampshire, MD;  Location: [place] [facility];  Service: Cardiovascular;  Laterality: Bilateral;  . s/p lipoma  2003  . TONSILLECTOMY  1952  . TRANSCAROTID ARTERY REVASCULARIZATION Right 11/12/2017   Procedure: TRANSCAROTID ARTERY REVASCULARIZATION;  Surgeon: Waynetta Sandy, MD;  Location: [facility] OR;  Service: Vascular;  Laterality: Right;    Social History:  Social History   Socioeconomic History  . Marital status: Single    Spouse name: Not on file  . Number of children: Not on file  . Years of education: Not on file  . Highest education level: Not on file  Occupational History  . Occupation: retired  Scientific laboratory technician  . Financial resource strain: Not on file  . Food insecurity:    Worry: Not on file    Inability: Not on file  . Transportation needs:    Medical: Not on file    Non-medical: Not on file  Tobacco Use  . Smoking status: Never Smoker  . Smokeless tobacco: Never Used  Substance  and Sexual Activity  . Alcohol use: No  . Drug use: No  . Sexual activity: Never  Lifestyle  . Physical activity:    Days per week: Not on file    Minutes per session: Not on file  . Stress: Not on file  Relationships  . Social connections:    Talks on phone: Not on file    Gets together: Not on file    Attends religious service: Not on file    Active member of club or organization: Not on file    Attends meetings of clubs or organizations: Not on file    Relationship status: Not on file  . Intimate partner violence:    Fear of current or ex partner: Not on file    Emotionally abused: Not on file    Physically abused: Not on file    Forced sexual activity: Not on file  Other Topics Concern  . Not on file  Social History Narrative  . Not on file    Family History:  Family History  Problem Relation Age of Onset  . Arthritis Mother   . Heart disease Mother   . Hypertension Mother   .  Arthritis Father   . Heart disease Father   . Hypertension Father   . Cancer Other        lung cancer    Medications:   Current Outpatient Medications on File Prior to Visit  Medication Sig Dispense Refill  . acetaminophen (TYLENOL) 500 MG tablet Take 500 mg by mouth every 6 (six) hours as needed for mild pain.    Marland Kitchen atorvastatin (LIPITOR) 20 MG tablet TAKE 1 TABLET (20 MG TOTAL) BY MOUTH DAILY. 90 tablet 2  . bromocriptine (PARLODEL) 2.5 MG tablet TAKE 1/2 TABLET AT BEDTIME 45 tablet 0  . ciclopirox (LOPROX) 0.77 % cream Apply 1 application topically daily.     . cilostazol (PLETAL) 100 MG tablet TAKE 1 TABLET BY MOUTH TWICE A DAY 180 tablet 1  . clobetasol (TEMOVATE) 0.05 % external solution Apply topically 2 (two) times daily. 50 mL 1  . clopidogrel (PLAVIX) 75 MG tablet Take 1 tablet (75 mg total) by mouth daily. 30 tablet 6  . clotrimazole-betamethasone (LOTRISONE) cream Apply 1 application topically 3 (three) times daily as needed. For rash 45 g 2  . glucose blood (ONETOUCH VERIO)  test strip 1 each by Other route daily. And lancets 1/day 250.00 100 each 12  . isosorbide mononitrate (IMDUR) 60 MG 24 hr tablet TAKE 1/2 TABLET BY MOUTH DAILY 15 tablet 0  . losartan (COZAAR) 25 MG tablet Take 1 tablet (25 mg total) by mouth daily. 90 tablet 3  . metFORMIN (GLUCOPHAGE-XR) 500 MG 24 hr tablet Take 2 tablets (1,000 mg total) by mouth 2 (two) times daily. 120 tablet 11  . metoprolol succinate (TOPROL-XL) 50 MG 24 hr tablet TAKE 1 TABLET BY MOUTH DAILY WITH OR IMMEDIATELY FOLLOWING A MEAL 90 tablet 2  . repaglinide (PRANDIN) 0.5 MG tablet Take 1 tablet (0.5 mg total) by mouth daily with supper. 90 tablet 3  . tiZANidine (ZANAFLEX) 2 MG tablet Take 1 tablet (2 mg total) by mouth every 6 (six) hours as needed for muscle spasms. 60 tablet 1  . TRAVATAN Z 0.004 % SOLN ophthalmic solution Place 1 drop into both eyes at bedtime.  4   No current facility-administered medications on file prior to visit.     Allergies:   Allergies  Allergen Reactions  . Levaquin [Levofloxacin In D5w] Nausea Only  . Tramadol Other (See Comments)    somnolence     Physical Exam  There were no vitals filed for this visit. There is no height or weight on file to calculate BMI. No exam data present  General: well developed, well nourished, pleasant elderly Caucasian male, seated, in no evident distress Head: head normocephalic and atraumatic.   Neck: supple with no carotid or supraclavicular bruits Cardiovascular: regular rate and rhythm, no murmurs Musculoskeletal: no deformity Skin:  no rash/petichiae; postprocedure dressings intact on neck (visualized) as well as dressing and groin (not visualized in parentheses Vascular:  Normal pulses all extremities  Neurologic Exam Mental Status: Awake and fully alert. Oriented to place and time. Recent and remote memory intact. Attention span, concentration and fund of knowledge appropriate. Mood and affect appropriate.  Cranial Nerves: Fundoscopic exam  reveals sharp disc margins. Pupils equal, briskly reactive to light. Extraocular movements full without nystagmus. Visual fields full to confrontation. Hearing intact. Facial sensation intact. Face, tongue, palate moves normally and symmetrically.  Motor: Normal bulk and tone. Normal strength in all tested extremity muscles. Sensory.: intact to touch , pinprick , position and vibratory sensation.  Coordination: Rapid  alternating movements normal in all extremities. Finger-to-nose and heel-to-shin performed accurately bilaterally. Gait and Station: Arises from chair with minimal difficulty. Stance is hunched. Gait demonstrates normal stride length and balance with assistance of rolling walker.  Reflexes: 1+ and symmetric. Toes downgoing.    NIHSS  0 Modified Rankin  0   Diagnostic Data (Labs, Imaging, Testing)  Ct angio neck w or wo contrast Ct angio head w or wo contrast 10/20/17 IMPRESSION: No acute brain finding by CT. Advanced atherosclerotic disease at both carotid bifurcations. On the right, the luminal diameter is 1 mm or less in the ICA bulb region, consistent with 80% or greater stenosis. On the left, atherosclerotic disease at the distal bulb results in narrowing of the lumen to 1.5 mm in diameter, consistent with a 70% stenosis. Atherosclerotic change in both carotid siphon regions with estimated 50% stenosis in the siphon regions. Moderate stenosis of the proximal anterior cerebral artery on the right. No other focal large or medium vessel narrowing beyond the siphons. Atherosclerotic change at both vertebral artery origins. Estimated 50% stenosis at each vertebral artery origin, but sufficient patency beyond that.  MR brain wo contrast 10/20/17 IMPRESSION: No acute finding. Age related atrophy. Mild chronic small-vessel ischemic change.    ASSESSMENT: Jeffrey Kaiser is a 84 y.o. year old male here with TIA on 10/20/17 secondary to cough related to presyncope episode  in setting of stenosis of bilateral ICAs. Vascular risk factors include CAD, DM, HTN and HLD. Underwent TCAR on 0/9/47 without complication.  Patient is being seen today for hospital follow-up and overall is doing very well from a stroke standpoint.    PLAN: -Continue aspirin 81 mg daily and clopidogrel 75 mg daily  and Lipitor for secondary stroke prevention -f.u with Dr. Donzetta Matters as scheduled along with repeat of carotid ultrasound in 1 month -F/u with PCP regarding your HLD, HTN and DM management -continue to monitor BP at home -Continue home OT and recommended that daughter call agency to ensure PT has also been ordered -daughter will notify us if PT was ordered so order can be placed -Advised to continue to monitor -Dressings or postprocedure sites encourage patient to increase activity as tolerated along with maintain a healthy diet -Maintain strict control of hypertension with blood pressure goal below 130/90, diabetes with hemoglobin A1c goal below 6.5% and cholesterol with LDL cholesterol (bad cholesterol) goal below 70 mg/dL. I also advised the patient to eat a healthy diet with plenty of whole grains, cereals, fruits and vegetables, exercise regularly and maintain ideal body weight.  Follow up in 3 months or call earlier if needed   Greater than 50% of time during this 25 minute visit was spent on counseling,explanation of diagnosis of TIA, reviewing risk factor management of HLD, HTN and DM, planning of further management, discussion with patient and family and coordination of care    Venancio Poisson, [facility]  [facility] [address] [place], [place] [address]  Phone [phone] Fax [phone]

## 2018-02-25 ENCOUNTER — Encounter: Payer: Self-pay | Admitting: Adult Health

## 2018-03-07 ENCOUNTER — Other Ambulatory Visit: Payer: Self-pay | Admitting: Endocrinology

## 2018-03-08 MED ORDER — BROMOCRIPTINE MESYLATE 2.5 MG PO TABS: 1.2500 mg | ORAL_TABLET | Freq: Every day | ORAL | 0 refills | Status: DC

## 2018-03-23 ENCOUNTER — Ambulatory Visit: Payer: Medicare Other | Admitting: Cardiovascular Disease

## 2018-04-06 ENCOUNTER — Telehealth: Payer: Self-pay

## 2018-04-06 DIAGNOSIS — L409 Psoriasis, unspecified: Secondary | ICD-10-CM

## 2018-04-06 NOTE — Telephone Encounter (Signed)
Copied from [place] [phone]. Topic: Referral - Question >> Apr 06, 2018  9:52 AM Conception Chancy, NT wrote: Reason for CRM: patient daughter Joseph Art is calling and requesting a referral to dermatology as his psoriasis is getting worse. Requesting a male.

## 2018-04-06 NOTE — Addendum Note (Signed)
Addended by: [name] on: 04/06/2018 11:57 AM   Modules accepted: Orders

## 2018-04-06 NOTE — Telephone Encounter (Signed)
Ok, this is done 

## 2018-04-20 ENCOUNTER — Telehealth: Payer: Self-pay

## 2018-04-20 NOTE — Telephone Encounter (Signed)
Copied from [place] [id number]. Topic: Referral - Question >> Apr 06, 2018  9:52 AM Conception Chancy, NT wrote: Reason for CRM: patient daughter Joseph Art is calling and requesting a referral to dermatology as his psoriasis is getting worse. Requesting a male. >> Apr 20, 2018  3:35 PM Cora Daniels D wrote: Left msg on daughter's cell regarding referral to call [phone]

## 2018-04-20 NOTE — Telephone Encounter (Signed)
No need, since already done dec 31 and can take more than 1 month to get an appt

## 2018-04-21 NOTE — Telephone Encounter (Signed)
Thank you. Noted.

## 2018-04-21 NOTE — Telephone Encounter (Signed)
Talked with pt's daughter yesterday

## 2018-04-29 ENCOUNTER — Other Ambulatory Visit: Payer: Self-pay | Admitting: Internal Medicine

## 2018-05-04 ENCOUNTER — Encounter: Payer: Self-pay | Admitting: Cardiovascular Disease

## 2018-05-04 ENCOUNTER — Ambulatory Visit (INDEPENDENT_AMBULATORY_CARE_PROVIDER_SITE_OTHER): Payer: Medicare HMO | Admitting: Cardiovascular Disease

## 2018-05-04 VITALS — BP 150/84 | HR 150 | Ht 65.0 in | Wt 150.4 lb

## 2018-05-04 DIAGNOSIS — I4891 Unspecified atrial fibrillation: Secondary | ICD-10-CM

## 2018-05-04 DIAGNOSIS — I10 Essential (primary) hypertension: Secondary | ICD-10-CM | POA: Diagnosis not present

## 2018-05-04 DIAGNOSIS — E785 Hyperlipidemia, unspecified: Secondary | ICD-10-CM | POA: Diagnosis not present

## 2018-05-04 DIAGNOSIS — I739 Peripheral vascular disease, unspecified: Secondary | ICD-10-CM | POA: Diagnosis not present

## 2018-05-04 DIAGNOSIS — I2510 Atherosclerotic heart disease of native coronary artery without angina pectoris: Secondary | ICD-10-CM

## 2018-05-04 DIAGNOSIS — I6523 Occlusion and stenosis of bilateral carotid arteries: Secondary | ICD-10-CM | POA: Diagnosis not present

## 2018-05-04 MED ORDER — METOPROLOL SUCCINATE ER 50 MG PO TB24: 50.0000 mg | ORAL_TABLET | Freq: Two times a day (BID) | ORAL | 1 refills | Status: DC

## 2018-05-04 MED ORDER — APIXABAN 5 MG PO TABS: 5.0000 mg | ORAL_TABLET | Freq: Two times a day (BID) | ORAL | 11 refills | Status: DC

## 2018-05-04 NOTE — Progress Notes (Signed)
Cardiology Office Note   Date:  05/05/2018   ID:  Jeffrey Kaiser 05-Mar-1926, MRN [id number]  PCP:  [name], MD  Cardiologist:   [name], MD   No chief complaint on file.     History of Present Illness: Jeffrey Kaiser is a 85 y.o. male who presents for a follow up visit regarding peripheral arterial disease and claudication. He has reported history of coronary artery disease with no available details, prolonged history of diabetes, hyperlipidemia, hypertension and arthritis. He has no history of tobacco use.  He was seen in 2016 for severe left calf claudication. Noninvasive vascular evaluation showed an ABI of 0.93 on the right and 0.31 on the left.  Angiography in 7/16  showed: 1. No significant aortoiliac disease. 2. Moderate distal left common femoral artery stenosis. Mild nonobstructive disease in the left SFA. Subtotal occlusion of the left popliteal artery proximally with 1 vessel runoff below the knee. 3. Mild nonobstructive disease affecting the right SFA and popliteal arteries. 2 vessel runoff below the knee on the right. The patient was treated medically with the addition of cilostazol with significant improvement in symptoms.   The patient had a TIA last year  and was diagnosed with severe bilateral carotid disease.  He underwent TCAR on right carotid artery by Dr. Donzetta Matters.    He woke up this morning and did not feel well after he took a shower.  He feels weak with some palpitations but no chest pain or shortness of breath.  No dizziness, syncope or presyncope.  He was upset during check and given some confusion about his insurance.  He is noted to be tachycardic and thus an EKG was performed which showed atrial fibrillation with rapid ventricular response.  Heart rate was 147 bpm.  There is no prior history of atrial fibrillation.  Past Medical History:  Diagnosis Date  . Anemia 09/04/2010  . Arthritis   . BPH (benign prostatic hyperplasia) 09/09/2010  .  CAD (coronary artery disease)   . DDD (degenerative disc disease), cervical 09/04/2010  . Diabetes mellitus   . Diastolic dysfunction 10/05/1599  . Diverticulitis   . Glaucoma   . Hernia   . History of colonic diverticulitis 09/04/2010  . Hyperlipidemia   . Hypertension   . Psoriasis 09/04/2010  . Stroke [facility])     Past Surgical History:  Procedure Laterality Date  . broken legs  1981 and 1995  . PERIPHERAL VASCULAR CATHETERIZATION N/A 10/25/2014   Procedure: Abdominal Aortogram;  Surgeon: Wellington Hampshire, MD;  Location: [place] [facility];  Service: Cardiovascular;  Laterality: N/A;  . PERIPHERAL VASCULAR CATHETERIZATION Bilateral 10/25/2014   Procedure: Lower Extremity Angiography;  Surgeon: Wellington Hampshire, MD;  Location: [place] [facility];  Service: Cardiovascular;  Laterality: Bilateral;  . s/p lipoma  2003  . TONSILLECTOMY  1952  . TRANSCAROTID ARTERY REVASCULARIZATION Right 11/12/2017   Procedure: TRANSCAROTID ARTERY REVASCULARIZATION;  Surgeon: Waynetta Sandy, MD;  Location: [place];  Service: Vascular;  Laterality: Right;     Current Outpatient Medications  Medication Sig Dispense Refill  . acetaminophen (TYLENOL) 500 MG tablet Take 500 mg by mouth every 6 (six) hours as needed for mild pain.    Marland Kitchen atorvastatin (LIPITOR) 20 MG tablet TAKE 1 TABLET (20 MG TOTAL) BY MOUTH DAILY. 90 tablet 2  . bromocriptine (PARLODEL) 2.5 MG tablet Take 0.5 tablets (1.25 mg total) by mouth at bedtime. 45 tablet 0  . ciclopirox (LOPROX) 0.77 %  cream Apply 1 application topically daily.     . clobetasol (TEMOVATE) 0.05 % external solution Apply topically 2 (two) times daily. 50 mL 1  . clotrimazole-betamethasone (LOTRISONE) cream Apply 1 application topically 3 (three) times daily as needed. For rash 45 g 2  . glucose blood (ONETOUCH VERIO) test strip 1 each by Other route daily. And lancets 1/day 250.00 100 each 12  . isosorbide mononitrate (IMDUR) 60 MG 24 hr tablet TAKE 1/2 TABLET BY  MOUTH EVERY DAY 15 tablet 2  . metFORMIN (GLUCOPHAGE-XR) 500 MG 24 hr tablet Take 2 tablets (1,000 mg total) by mouth 2 (two) times daily. 120 tablet 11  . metoprolol succinate (TOPROL-XL) 50 MG 24 hr tablet Take 1 tablet (50 mg total) by mouth 2 (two) times daily. Take with or immediately following a meal. 180 tablet 1  . repaglinide (PRANDIN) 0.5 MG tablet Take 1 tablet (0.5 mg total) by mouth daily with supper. 90 tablet 3  . tiZANidine (ZANAFLEX) 2 MG tablet Take 1 tablet (2 mg total) by mouth every 6 (six) hours as needed for muscle spasms. 60 tablet 1  . TRAVATAN Z 0.004 % SOLN ophthalmic solution Place 1 drop into both eyes at bedtime.  4  . apixaban (ELIQUIS) 5 MG TABS tablet Take 1 tablet (5 mg total) by mouth 2 (two) times daily. 60 tablet 11  . losartan (COZAAR) 25 MG tablet Take 1 tablet (25 mg total) by mouth daily. 90 tablet 3   No current facility-administered medications for this visit.     Allergies:   Levaquin [levofloxacin in d5w] and Tramadol    Social History:  The patient  reports that he has never smoked. He has never used smokeless tobacco. He reports that he does not drink alcohol or use drugs.   Family History:  The patient's family history includes Arthritis in his father and mother; Cancer in an other family member; Heart disease in his father and mother; Hypertension in his father and mother.    ROS:  Please see the history of present illness.   Otherwise, review of systems are positive for none.   All other systems are reviewed and negative.    PHYSICAL EXAM: VS:  BP (!) 150/84   Pulse (!) 150   Ht 5\' 5"  (1.651 m)   Wt 150 lb 6.4 oz (68.2 kg)   SpO2 98%   BMI 25.03 kg/m  , BMI Body mass index is 25.03 kg/m. GEN: Well nourished, well developed, in no acute distress  HEENT: normal  Neck: no JVD,  or masses Cardiac: Irregularly irregular and tachycardic; no murmurs, rubs, or gallops,no edema  Respiratory:  clear to auscultation bilaterally, normal work  of breathing GI: soft, nontender, nondistended, + BS MS: no deformity or atrophy  Skin: warm and dry, no rash Neuro:  Strength and sensation are intact Psych: euthymic mood, full affect Distal pulses are not palpable.   EKG:  EKG is  ordered today. EKG showed atrial fibrillation with rapid ventricular rate.  Heart rate is 147 bpm.  Minor ST changes suggestive of ischemia.  Recent Labs: 11/12/2017: ALT 11 05/04/2018: BUN 24; Creatinine, Ser 1.20; Hemoglobin 12.5; Platelets 207; Potassium 4.5; Sodium 142; TSH 3.800    Lipid Panel    Component Value Date/Time   CHOL 128 10/21/2017 0408   TRIG 91 10/21/2017 0408   HDL 36 (L) 10/21/2017 0408   CHOLHDL 3.6 10/21/2017 0408   VLDL 18 10/21/2017 0408   LDLCALC 74 10/21/2017 0408  LDLDIRECT 114.4 05/13/2012 1417      Wt Readings from Last 3 Encounters:  05/04/18 150 lb 6.4 oz (68.2 kg)  02/22/18 148 lb 3.2 oz (67.2 kg)  01/22/18 149 lb (67.6 kg)       ASSESSMENT AND PLAN:  1.  Newly diagnosed atrial fibrillation with rapid ventricular response: The patient is tachycardic.  Other than some mild weakness and palpitations, he does not appear to be symptomatic.  I discussed with him management of atrial fibrillation.  I am going to obtain labs including CBC, basic metabolic profile and TSH. Echo last year showed normal LV systolic function with no significant valvular abnormalities. Chads Vas score is 8.  Thus, he is at high risk for thromboembolic complications.  I discussed with him indications for anticoagulation and decided to start Eliquis 5 mg twice daily. I increased Toprol to 50 mg twice daily. Follow-up on Friday to ensure adequate rate control. Cardioversion can be considered especially if rate control is still difficult.  2.  Peripheral arterial disease with claudication: I elected to discontinue Plavix and cilostazol today to minimize the risk of bleeding given the initiation of anticoagulation with Eliquis.  2.  Hyperlipidemia: Continue treatment with atorvastatin with a target LDL of less than 70.  3. Coronary artery disease involving native coronary arteries without angina: Continue medical therapy.   4. Essential hypertension: Blood pressure is elevated.  I increased Toprol.  5.  Bilateral carotid disease status post revascularization on the right side.  Followed by [facility].   Disposition:   FU with APP on Friday  Signed,  [name], MD  05/05/2018 5:15 PM    [facility]

## 2018-05-04 NOTE — Patient Instructions (Addendum)
Medication Instructions:  STOP the Plavix STOP the Cilostazol INCREASE the Metoprolol (Toprol) to 50 mg twice a day. START Eliquis 5 mg twice daily  If you need a refill on your cardiac medications before your next appointment, please call your pharmacy.   Lab work: Your provider would like for you to have the following labs today: CBC, BMET and TSH  If you have labs (blood work) drawn today and your tests are completely normal, you will receive your results only by: Marland Kitchen MyChart Message (if you have MyChart) OR . A paper copy in the mail If you have any lab test that is abnormal or we need to change your treatment, we will call you to review the results.  Testing/Procedures: None ordered  Follow-Up: Dr Fletcher Anon would like for you to follow up this Friday with an APP.

## 2018-05-05 DIAGNOSIS — L308 Other specified dermatitis: Secondary | ICD-10-CM | POA: Diagnosis not present

## 2018-05-05 DIAGNOSIS — L400 Psoriasis vulgaris: Secondary | ICD-10-CM | POA: Diagnosis not present

## 2018-05-05 LAB — BASIC METABOLIC PANEL
BUN/Creatinine Ratio: 20 (ref 10–24)
BUN: 24 mg/dL (ref 10–36)
CALCIUM: 9.9 mg/dL (ref 8.6–10.2)
CHLORIDE: 99 mmol/L (ref 96–106)
CO2: 22 mmol/L (ref 20–29)
Creatinine, Ser: 1.2 mg/dL (ref 0.76–1.27)
GFR calc non Af Amer: 53 mL/min/{1.73_m2} — ABNORMAL LOW (ref >59)
GFR, EST AFRICAN AMERICAN: 61 mL/min/{1.73_m2} (ref >59)
Glucose: 193 mg/dL — ABNORMAL HIGH (ref 65–99)
POTASSIUM: 4.5 mmol/L (ref 3.5–5.2)
Sodium: 142 mmol/L (ref 134–144)

## 2018-05-05 LAB — CBC
HEMATOCRIT: 39.3 % (ref 37.5–51.0)
HEMOGLOBIN: 12.5 g/dL — AB (ref 13.0–17.7)
MCH: 28.2 pg (ref 26.6–33.0)
MCHC: 31.8 g/dL (ref 31.5–35.7)
MCV: 89 fL (ref 79–97)
Platelets: 207 10*3/uL (ref 150–450)
RBC: 4.44 x10E6/uL (ref 4.14–5.80)
RDW: 14.5 % (ref 11.6–15.4)
WBC: 8.8 10*3/uL (ref 3.4–10.8)

## 2018-05-05 LAB — TSH: TSH: 3.8 u[IU]/mL (ref 0.450–4.500)

## 2018-05-07 ENCOUNTER — Ambulatory Visit (INDEPENDENT_AMBULATORY_CARE_PROVIDER_SITE_OTHER): Payer: Medicare HMO | Admitting: Cardiology

## 2018-05-07 ENCOUNTER — Encounter: Payer: Self-pay | Admitting: Cardiology

## 2018-05-07 VITALS — BP 138/66 | HR 73 | Ht 65.0 in | Wt 149.6 lb

## 2018-05-07 DIAGNOSIS — I6521 Occlusion and stenosis of right carotid artery: Secondary | ICD-10-CM

## 2018-05-07 DIAGNOSIS — Z8673 Personal history of transient ischemic attack (TIA), and cerebral infarction without residual deficits: Secondary | ICD-10-CM

## 2018-05-07 DIAGNOSIS — I10 Essential (primary) hypertension: Secondary | ICD-10-CM

## 2018-05-07 DIAGNOSIS — Z7901 Long term (current) use of anticoagulants: Secondary | ICD-10-CM

## 2018-05-07 DIAGNOSIS — I4891 Unspecified atrial fibrillation: Secondary | ICD-10-CM | POA: Insufficient documentation

## 2018-05-07 DIAGNOSIS — I2510 Atherosclerotic heart disease of native coronary artery without angina pectoris: Secondary | ICD-10-CM | POA: Diagnosis not present

## 2018-05-07 NOTE — Assessment & Plan Note (Signed)
S/P RCA stent Aug 2019

## 2018-05-07 NOTE — Progress Notes (Signed)
05/07/2018 Jeffrey Kaiser   1926-01-18  [id number] Primary Cardiologist: Dr Fletcher Anon  HPI: Jeffrey Kaiser is a pleasant 85 year old [place] who has been followed by Dr. Fletcher Anon with a history of vascular disease, hypertension, non-insulin-dependent diabetes, and hypertension.  There is mention of prior coronary disease though the patient does not know of any history of this and I could find no prior documentation of coronary disease.  He denies any chest pain. He was in the office May 04, 2018 with fatigue.  He was noted to be in atrial fibrillation with RVR.  This was new for him.  Dr. Fletcher Anon increased his Toprol to 50 mg BID and added Eliquis.  He returns today for follow-up.  His heart rhythm today is 73 and regular, he is in normal sinus rhythm.  He feels much better.   Current Outpatient Medications  Medication Sig Dispense Refill  . acetaminophen (TYLENOL) 500 MG tablet Take 500 mg by mouth every 6 (six) hours as needed for mild pain.    Marland Kitchen apixaban (ELIQUIS) 5 MG TABS tablet Take 1 tablet (5 mg total) by mouth 2 (two) times daily. 60 tablet 11  . atorvastatin (LIPITOR) 20 MG tablet TAKE 1 TABLET (20 MG TOTAL) BY MOUTH DAILY. 90 tablet 2  . bromocriptine (PARLODEL) 2.5 MG tablet Take 0.5 tablets (1.25 mg total) by mouth at bedtime. 45 tablet 0  . ciclopirox (LOPROX) 0.77 % cream Apply 1 application topically daily.     . clobetasol (TEMOVATE) 0.05 % external solution Apply topically 2 (two) times daily. 50 mL 1  . clotrimazole-betamethasone (LOTRISONE) cream Apply 1 application topically 3 (three) times daily as needed. For rash 45 g 2  . glucose blood (ONETOUCH VERIO) test strip 1 each by Other route daily. And lancets 1/day 250.00 100 each 12  . isosorbide mononitrate (IMDUR) 60 MG 24 hr tablet TAKE 1/2 TABLET BY MOUTH EVERY DAY 15 tablet 2  . metFORMIN (GLUCOPHAGE-XR) 500 MG 24 hr tablet Take 2 tablets (1,000 mg total) by mouth 2 (two) times daily. 120 tablet 11  . metoprolol  succinate (TOPROL-XL) 50 MG 24 hr tablet Take 1 tablet (50 mg total) by mouth 2 (two) times daily. Take with or immediately following a meal. 180 tablet 1  . repaglinide (PRANDIN) 0.5 MG tablet Take 1 tablet (0.5 mg total) by mouth daily with supper. 90 tablet 3  . tiZANidine (ZANAFLEX) 2 MG tablet Take 1 tablet (2 mg total) by mouth every 6 (six) hours as needed for muscle spasms. 60 tablet 1  . TRAVATAN Z 0.004 % SOLN ophthalmic solution Place 1 drop into both eyes at bedtime.  4  . losartan (COZAAR) 25 MG tablet Take 1 tablet (25 mg total) by mouth daily. 90 tablet 3   No current facility-administered medications for this visit.     Allergies  Allergen Reactions  . Levaquin [Levofloxacin In D5w] Nausea Only  . Tramadol Other (See Comments)    somnolence    Past Medical History:  Diagnosis Date  . Anemia 09/04/2010  . Arthritis   . BPH (benign prostatic hyperplasia) 09/09/2010  . CAD (coronary artery disease)   . DDD (degenerative disc disease), cervical 09/04/2010  . Diabetes mellitus   . Diastolic dysfunction 6/94/8546  . Diverticulitis   . Glaucoma   . Hernia   . History of colonic diverticulitis 09/04/2010  . Hyperlipidemia   . Hypertension   . Psoriasis 09/04/2010  . Stroke [facility])     Social  History   Socioeconomic History  . Marital status: Single    Spouse name: Not on file  . Number of children: Not on file  . Years of education: Not on file  . Highest education level: Not on file  Occupational History  . Occupation: retired  Scientific laboratory technician  . Financial resource strain: Not on file  . Food insecurity:    Worry: Not on file    Inability: Not on file  . Transportation needs:    Medical: Not on file    Non-medical: Not on file  Tobacco Use  . Smoking status: Never Smoker  . Smokeless tobacco: Never Used  Substance and Sexual Activity  . Alcohol use: No  . Drug use: No  . Sexual activity: Never  Lifestyle  . Physical activity:    Days per week: Not on file      Minutes per session: Not on file  . Stress: Not on file  Relationships  . Social connections:    Talks on phone: Not on file    Gets together: Not on file    Attends religious service: Not on file    Active member of club or organization: Not on file    Attends meetings of clubs or organizations: Not on file    Relationship status: Not on file  . Intimate partner violence:    Fear of current or ex partner: Not on file    Emotionally abused: Not on file    Physically abused: Not on file    Forced sexual activity: Not on file  Other Topics Concern  . Not on file  Social History Narrative  . Not on file     Family History  Problem Relation Age of Onset  . Arthritis Mother   . Heart disease Mother   . Hypertension Mother   . Arthritis Father   . Heart disease Father   . Hypertension Father   . Cancer Other        lung cancer     Review of Systems: General: negative for chills, fever, night sweats or weight changes.  Cardiovascular: negative for chest pain, dyspnea on exertion, edema, orthopnea, palpitations, paroxysmal nocturnal dyspnea or shortness of breath Dermatological: negative for rash Respiratory: negative for cough or wheezing Urologic: negative for hematuria Abdominal: negative for nausea, vomiting, diarrhea, bright red blood per rectum, melena, or hematemesis Neurologic: negative for visual changes, syncope, or dizziness All other systems reviewed and are otherwise negative except as noted above.    Blood pressure 138/66, pulse 73, height 5\' 5"  (1.651 m), weight 149 lb 9.6 oz (67.9 kg).  General appearance: alert, cooperative, appears stated age and no distress Neck: no carotid bruit and no JVD Lungs: few crackles Lt base Heart: regular rate and rhythm Extremities: no edema Skin: warm and dry Neurologic: Grossly normal  EKG NSR, TWI 1, 2, AVL, V4-V6 (similar to past EKGs)  Labs: K+ 4.5, SCr 1.20, Hgb 12.5, TSH 3.8  ASSESSMENT AND PLAN:   Atrial  fibrillation with RVR [facility]) New onset Jan 28th 2020- NSR on 05/07/2018 after Toprol increase  Anticoagulated CHADS VASC=8, Eliquis added, ASA stopped  Carotid artery stenosis, symptomatic, right S/P RCA stent Aug 2019  History of TIA (transient ischemic attack) Prior to RCA stent Aug 2019  Essential hypertension Controlled. Echo July 2019- EF 55-60%, moderate LAE   PLAN  Same Rx.  I asked them to watch for slow HR (less than 55) as well as fast HR.  F/U in  3 months.   Kerin Ransom PA-C 05/07/2018 11:31 AM

## 2018-05-07 NOTE — Assessment & Plan Note (Signed)
Controlled. Echo July 2019- EF 55-60%, moderate LAE

## 2018-05-07 NOTE — Assessment & Plan Note (Signed)
Prior to RCA stent Aug 2019

## 2018-05-07 NOTE — Assessment & Plan Note (Signed)
New onset Jan 28th 2020- NSR on 05/07/2018 after Toprol increase

## 2018-05-07 NOTE — Assessment & Plan Note (Signed)
CHADS VASC=8, Eliquis added, ASA stopped

## 2018-05-07 NOTE — Addendum Note (Signed)
Addended by: Jacqulynn Cadet on: 05/07/2018 01:53 PM   Modules accepted: Orders

## 2018-05-07 NOTE — Patient Instructions (Signed)
Medication Instructions:  NOT NEEDED If you need a refill on your cardiac medications before your next appointment, please call your pharmacy.   Lab work: NOT NEEDED If you have labs (blood work) drawn today and your tests are completely normal, you will receive your results only by: Marland Kitchen MyChart Message (if you have MyChart) OR . A paper copy in the mail If you have any lab test that is abnormal or we need to change your treatment, we will call you to review the results.  Testing/Procedures: NOT NEEDED  Follow-Up: At [facility], you and your health needs are our priority.  As part of our continuing mission to provide you with exceptional heart care, we have created designated Provider Care Teams.  These Care Teams include your primary Cardiologist (physician) and Advanced Practice Providers (APPs -  Physician Assistants and Nurse Practitioners) who all work together to provide you with the care you need, when you need it. You will need a follow up appointment in 3 months.  Please call our office 2 months in advance to schedule this appointment.  You may see [name], MD or one of the following Advanced Practice Providers on your designated Care Team:   Kerin Ransom, PA-C [place], [place] . Sande Rives, PA-C  Any Other Special Instructions Will Be Listed Below (If Applicable).

## 2018-05-24 ENCOUNTER — Ambulatory Visit: Payer: Medicare Other | Admitting: Endocrinology

## 2018-05-30 ENCOUNTER — Other Ambulatory Visit: Payer: Self-pay | Admitting: Endocrinology

## 2018-05-30 NOTE — Telephone Encounter (Signed)
Please refill x 1 Ov is due  

## 2018-06-07 ENCOUNTER — Other Ambulatory Visit: Payer: Self-pay

## 2018-06-07 DIAGNOSIS — I6523 Occlusion and stenosis of bilateral carotid arteries: Secondary | ICD-10-CM

## 2018-06-08 ENCOUNTER — Ambulatory Visit (INDEPENDENT_AMBULATORY_CARE_PROVIDER_SITE_OTHER): Payer: Medicare HMO | Admitting: Physician Assistant

## 2018-06-08 ENCOUNTER — Ambulatory Visit (HOSPITAL_COMMUNITY)
Admission: RE | Admit: 2018-06-08 | Discharge: 2018-06-08 | Disposition: A | Discharge status: Home / Self Care | Payer: Medicare HMO | Source: Ambulatory Visit | Attending: Surgery | Admitting: Surgery

## 2018-06-08 ENCOUNTER — Other Ambulatory Visit: Payer: Self-pay

## 2018-06-08 ENCOUNTER — Encounter: Payer: Self-pay | Admitting: Physician Assistant

## 2018-06-08 VITALS — BP 152/75 | HR 71 | Temp 97.0°F | Resp 20 | Ht 60.0 in | Wt 150.1 lb

## 2018-06-08 DIAGNOSIS — I6523 Occlusion and stenosis of bilateral carotid arteries: Secondary | ICD-10-CM | POA: Diagnosis not present

## 2018-06-08 NOTE — Progress Notes (Signed)
Established Carotid Patient   History of Present Illness   Jeffrey Kaiser is a 85 y.o. (1925/11/05) male who presents with chief complaint: carotid artery stenosis.  He underwent right neck TCAR for symptomatic carotid stenosis performed by Dr. Donzetta Matters on 11/2017.  Preoperatively left ICA also considered to be high-grade stenosis however this has not been replicated since surgery.  He denies any strokelike symptoms including slurring speech, changes in vision, or one-sided weakness.  He is on statin daily.  He is also on Eliquis daily for atrial fibrillation prescribed by his cardiologist.  Current Outpatient Medications  Medication Sig Dispense Refill  . acetaminophen (TYLENOL) 500 MG tablet Take 500 mg by mouth every 6 (six) hours as needed for mild pain.    Marland Kitchen apixaban (ELIQUIS) 5 MG TABS tablet Take 1 tablet (5 mg total) by mouth 2 (two) times daily. 60 tablet 11  . atorvastatin (LIPITOR) 20 MG tablet TAKE 1 TABLET (20 MG TOTAL) BY MOUTH DAILY. 90 tablet 2  . bromocriptine (PARLODEL) 2.5 MG tablet TAKE 1/2 TABLET BY MOUTH AT BEDTIME. 15 tablet 0  . ciclopirox (LOPROX) 0.77 % cream Apply 1 application topically daily.     . clobetasol (TEMOVATE) 0.05 % external solution Apply topically 2 (two) times daily. 50 mL 1  . clotrimazole-betamethasone (LOTRISONE) cream Apply 1 application topically 3 (three) times daily as needed. For rash 45 g 2  . glucose blood (ONETOUCH VERIO) test strip 1 each by Other route daily. And lancets 1/day 250.00 100 each 12  . isosorbide mononitrate (IMDUR) 60 MG 24 hr tablet TAKE 1/2 TABLET BY MOUTH EVERY DAY 15 tablet 2  . metFORMIN (GLUCOPHAGE-XR) 500 MG 24 hr tablet Take 2 tablets (1,000 mg total) by mouth 2 (two) times daily. 120 tablet 11  . metoprolol succinate (TOPROL-XL) 50 MG 24 hr tablet Take 1 tablet (50 mg total) by mouth 2 (two) times daily. Take with or immediately following a meal. 180 tablet 1  . repaglinide (PRANDIN) 0.5 MG tablet Take 1 tablet (0.5 mg  total) by mouth daily with supper. 90 tablet 3  . tiZANidine (ZANAFLEX) 2 MG tablet Take 1 tablet (2 mg total) by mouth every 6 (six) hours as needed for muscle spasms. 60 tablet 1  . TRAVATAN Z 0.004 % SOLN ophthalmic solution Place 1 drop into both eyes at bedtime.  4  . losartan (COZAAR) 25 MG tablet Take 1 tablet (25 mg total) by mouth daily. 90 tablet 3   No current facility-administered medications for this visit.     On ROS today: 10 system ROS otherwise negative   Physical Examination   Vitals:   06/08/18 1034 06/08/18 1039  BP: (!) 166/77 (!) 152/75  Pulse: 71   Resp: 20   Temp: (!) 97 F (36.1 C)   TempSrc: Oral   SpO2: 96%   Weight: 150 lb 2.1 oz (68.1 kg)   Height: 5' (1.524 m)    Body mass index is 29.32 kg/m.  General Alert, O x 3, WD, NAD  Neck Supple, mid-line trachea, Neck incision healed  Pulmonary Sym exp, good B air movt  Cardiac RRR, Nl S1, S2  Vascular Vessel Right Left  Radial Palpable Palpable  Carotid Palpable, No Bruit Palpable, No Bruit  Aorta Not palpable N/A    Gastro- intestinal soft, non-distended, non-tender to palpation,   Musculo- skeletal M/S 5/5 throughout  , Extremities without ischemic changes    Neurologic Cranial nerves 2-12 intact  Non-Invasive Vascular Imaging   B Carotid Duplex (06/08/18):   R ICA stenosis:  widely patent stent  R VA:  patent and antegrade  L ICA stenosis:  1-39%  L VA:  patent and antegrade   Medical Decision Making   Jeffrey Kaiser is a 85 y.o. male who presents for carotid duplex about 6 months postop from right sided TCAR   Right ICA stent widely patent  Left ICA only 1 to 39% stenosis by duplex  Continue statin  Recheck carotid duplex in 6 months per protocol; if unchanged at that time patient can be followed annually   Dagoberto Ligas PA-C [facility] Office: [phone]  Clinic MD: Dr. Carlis Abbott

## 2018-06-24 ENCOUNTER — Telehealth: Payer: Self-pay | Admitting: *Deleted

## 2018-06-24 NOTE — Telephone Encounter (Signed)
Call placed to [facility] pharmacy concerning a fax that was received stating that they were currently out of Losartan 25 mg daily. They stated that they did have this medication and that the fax could be disregarded.

## 2018-07-19 ENCOUNTER — Telehealth: Payer: Self-pay

## 2018-07-19 NOTE — Telephone Encounter (Signed)
TELEPHONE VISIT CONSENT OBTAINED VERBALLY   YOUR CARDIOLOGY TEAM HAS ARRANGED FOR AN E-VISIT FOR YOUR APPOINTMENT - PLEASE REVIEW IMPORTANT INFORMATION BELOW SEVERAL DAYS PRIOR TO YOUR APPOINTMENT  Due to the recent COVID-19 pandemic, we are transitioning in-person office visits to tele-medicine visits in an effort to decrease unnecessary exposure to our patients and staff. Medicare and most insurances are covering these visits without a copay needed. We also encourage you to sign up for MyChart if you have not already done so. You will need a smartphone if possible. For patients that do not have this, we can still complete the visit using a regular telephone but do prefer a smartphone to enable video when possible. You may have a close family member that lives with you that can help. If possible, we also ask that you have a blood pressure cuff and scale at home to measure your blood pressure, heart rate and weight prior to your scheduled appointment. Patients with clinical needs that need an in-person evaluation and testing will still be able to come to the office if absolutely necessary. If you have any questions, feel free to call our office.    IF YOU HAVE A SMARTPHONE, PLEASE DOWNLOAD THE WEBEX APP TO YOUR SMARTPHONE  - If Apple, go to App Store and type in WebEx in the search bar. Download [employer] Webex Meetings, the blue/green circle. The app is free but as with any other app download, your phone may require you to verify saved payment information or Apple password. You do NOT have to create a WebEx account.  - If Android, go to Google Play Store and type in WebEx in the search bar. Download [employer] Webex Meetings, the blue/green circle. The app is free but as with any other app download, your phone may require you to verify saved payment information or Android password. You do NOT have to create a WebEx account.  It is very helpful to have this downloaded before your visit.    2-3 DAYS BEFORE  YOUR APPOINTMENT  You will receive a telephone call from one of our [facility] team members - your caller ID may say "Unknown caller." If this is a video visit, we will confirm that you have been able to download the WebEx app. We will remind you check your blood pressure, heart rate and weight prior to your scheduled appointment. If you have an Apple Watch or Kardia, please upload any pertinent ECG strips the day before or morning of your appointment to MyChart. Our staff will also make sure you have reviewed the consent and agree to move forward with your scheduled tele-health visit.     THE DAY OF YOUR APPOINTMENT  Approximately 15 minutes prior to your scheduled appointment, you will receive a telephone call from one of HeartCare team - your caller ID may say "Unknown caller."  Our staff will confirm medications, vital signs for the day and any symptoms you may be experiencing. Please have this information available prior to the time of visit start. It may also be helpful for you to have a pad of paper and pen handy for any instructions given during your visit. They will also walk you through joining the WebEx smartphone meeting if this is a video visit.    CONSENT FOR TELE-HEALTH VISIT - PLEASE REVIEW  I hereby voluntarily request, consent and authorize [facility] and its employed or contracted physicians, physician assistants, nurse practitioners or other licensed health care professionals (the Practitioner), to provide me with   telemedicine health care services (the "Services") as deemed necessary by the treating Practitioner. I acknowledge and consent to receive the Services by the Practitioner via telemedicine. I understand that the telemedicine visit will involve communicating with the Practitioner through live audiovisual communication technology and the disclosure of certain medical information by electronic transmission. I acknowledge that I have been given the opportunity to request an  in-person assessment or other available alternative prior to the telemedicine visit and am voluntarily participating in the telemedicine visit.  I understand that I have the right to withhold or withdraw my consent to the use of telemedicine in the course of my care at any time, without affecting my right to future care or treatment, and that the Practitioner or I may terminate the telemedicine visit at any time. I understand that I have the right to inspect all information obtained and/or recorded in the course of the telemedicine visit and may receive copies of available information for a reasonable fee.  I understand that some of the potential risks of receiving the Services via telemedicine include:  . Delay or interruption in medical evaluation due to technological equipment failure or disruption; . Information transmitted may not be sufficient (e.g. poor resolution of images) to allow for appropriate medical decision making by the Practitioner; and/or  . In rare instances, security protocols could fail, causing a breach of personal health information.  Furthermore, I acknowledge that it is my responsibility to provide information about my medical history, conditions and care that is complete and accurate to the best of my ability. I acknowledge that Practitioner's advice, recommendations, and/or decision may be based on factors not within their control, such as incomplete or inaccurate data provided by me or distortions of diagnostic images or specimens that may result from electronic transmissions. I understand that the practice of medicine is not an exact science and that Practitioner makes no warranties or guarantees regarding treatment outcomes. I acknowledge that I will receive a copy of this consent concurrently upon execution via email to the email address I last provided but may also request a printed copy by calling the office of [facility].    I understand that my insurance will be  billed for this visit.   I have read or had this consent read to me. . I understand the contents of this consent, which adequately explains the benefits and risks of the Services being provided via telemedicine.  . I have been provided ample opportunity to ask questions regarding this consent and the Services and have had my questions answered to my satisfaction. . I give my informed consent for the services to be provided through the use of telemedicine in my medical care  By participating in this telemedicine visit I agree to the above. YES  

## 2018-07-23 ENCOUNTER — Telehealth: Payer: Self-pay | Admitting: Cardiovascular Disease

## 2018-07-23 NOTE — Telephone Encounter (Signed)
LVM for pre reg °

## 2018-07-26 ENCOUNTER — Telehealth: Payer: Self-pay | Admitting: Cardiovascular Disease

## 2018-07-26 ENCOUNTER — Telehealth: Payer: Self-pay

## 2018-07-26 NOTE — Telephone Encounter (Signed)
Daughter's phone/ my chart/ pre reg completed

## 2018-07-26 NOTE — Telephone Encounter (Signed)
Received notification from [place] requesting completion of CMN. Per Dr. Loanne Drilling, pt will require an updated office visit in order to complete. Called pt and LVM requesting returned call.

## 2018-07-27 ENCOUNTER — Ambulatory Visit: Payer: Medicare Other | Admitting: Internal Medicine

## 2018-07-27 ENCOUNTER — Encounter: Payer: Self-pay | Admitting: Cardiovascular Disease

## 2018-07-27 ENCOUNTER — Telehealth (INDEPENDENT_AMBULATORY_CARE_PROVIDER_SITE_OTHER): Payer: Medicare HMO | Admitting: Cardiovascular Disease

## 2018-07-27 ENCOUNTER — Other Ambulatory Visit: Payer: Self-pay | Admitting: *Deleted

## 2018-07-27 VITALS — BP 125/68 | HR 66 | Ht 65.0 in | Wt 149.0 lb

## 2018-07-27 DIAGNOSIS — I10 Essential (primary) hypertension: Secondary | ICD-10-CM

## 2018-07-27 DIAGNOSIS — I480 Paroxysmal atrial fibrillation: Secondary | ICD-10-CM

## 2018-07-27 DIAGNOSIS — I739 Peripheral vascular disease, unspecified: Secondary | ICD-10-CM

## 2018-07-27 NOTE — Patient Instructions (Signed)
Medication Instructions:  Continue same medications If you need a refill on your cardiac medications before your next appointment, please call your pharmacy.   Lab work: CBC and basic metabolic profile to be done in 3 months If you have labs (blood work) drawn today and your tests are completely normal, you will receive your results only by: Marland Kitchen MyChart Message (if you have MyChart) OR . A paper copy in the mail If you have any lab test that is abnormal or we need to change your treatment, we will call you to review the results.  Testing/Procedures: None  Follow-Up: At [facility], you and your health needs are our priority.  As part of our continuing mission to provide you with exceptional heart care, we have created designated Provider Care Teams.  These Care Teams include your primary Cardiologist (physician) and Advanced Practice Providers (APPs -  Physician Assistants and Nurse Practitioners) who all work together to provide you with the care you need, when you need it. You will need a follow up appointment in 3 months.  Please call our office 2 months in advance to schedule this appointment.  You may see [name], MD or one of the following Advanced Practice Providers on your designated Care Team:   Kerin Ransom, PA-C [address] PA-C [place], [place]

## 2018-07-27 NOTE — Progress Notes (Signed)
Virtual Visit via Video Note   This visit type was conducted due to national recommendations for restrictions regarding the COVID-19 Pandemic (e.g. social distancing) in an effort to limit this patient's exposure and mitigate transmission in our community.  Due to his co-morbid illnesses, this patient is at least at moderate risk for complications without adequate follow up.  This format is felt to be most appropriate for this patient at this time.  All issues noted in this document were discussed and addressed.  A limited physical exam was performed with this format.  Please refer to the patient's chart for his consent to telehealth for [facility]   Evaluation Performed:  Follow-up visit  Date:  07/27/2018   ID:  Jeffrey, Kaiser September 17, 1925, MRN [id number]  Patient Location: Home Provider Location: Office  PCP:  [name], MD  Cardiologist:  [name], MD  Electrophysiologist:  None   Chief Complaint: Follow-up  History of Present Illness:    Jeffrey Kaiser is a 85 y.o. male with history of peripheral arterial disease and paroxysmal atrial fibrillation who is seen via a web visit. Marland Kitchen He has reported history of coronary artery disease with no available details, prolonged history of diabetes, hyperlipidemia, hypertension and arthritis. He has no history of tobacco use.  He was seen in 2016 for severe left calf claudication. Noninvasive vascular evaluation showed an ABI of 0.93 on the right and 0.31 on the left.  Angiography in 7/16  showed: 1. No significant aortoiliac disease. 2. Moderate distal left common femoral artery stenosis. Mild nonobstructive disease in the left SFA. Subtotal occlusion of the left popliteal artery proximally with 1 vessel runoff below the knee. 3. Mild nonobstructive disease affecting the right SFA and popliteal arteries. 2 vessel runoff below the knee on the right. The patient was treated medically.  The patient had a TIA last year  and was  diagnosed with severe bilateral carotid disease.  He underwent TCAR on right carotid artery by Dr. Donzetta Matters.    During last visit, he was noted to be in atrial fibrillation with rapid ventricular response.  The dose of Toprol was doubled and I started him on Eliquis for anticoagulation.  Cilostazol and Plavix were discontinued at that time. He returned for a follow-up visit and was noted to be in sinus rhythm and was feeling significantly better.  He has been doing well with no recurrent palpitations or tachycardia.  No chest pain or shortness of breath.  He denies leg claudication.   The patient does not have symptoms concerning for COVID-19 infection (fever, chills, cough, or new shortness of breath).    Past Medical History:  Diagnosis Date  . Anemia 09/04/2010  . Arthritis   . BPH (benign prostatic hyperplasia) 09/09/2010  . CAD (coronary artery disease)   . DDD (degenerative disc disease), cervical 09/04/2010  . Diabetes mellitus   . Diastolic dysfunction 1/96/2229  . Diverticulitis   . Glaucoma   . Hernia   . History of colonic diverticulitis 09/04/2010  . Hyperlipidemia   . Hypertension   . Psoriasis 09/04/2010  . Stroke [facility])    Past Surgical History:  Procedure Laterality Date  . broken legs  1981 and 1995  . PERIPHERAL VASCULAR CATHETERIZATION N/A 10/25/2014   Procedure: Abdominal Aortogram;  Surgeon: Wellington Hampshire, MD;  Location: [place] [facility];  Service: Cardiovascular;  Laterality: N/A;  . PERIPHERAL VASCULAR CATHETERIZATION Bilateral 10/25/2014   Procedure: Lower Extremity Angiography;  Surgeon: Mertie Clause  Fletcher Anon, MD;  Location: [place] [facility];  Service: Cardiovascular;  Laterality: Bilateral;  . s/p lipoma  2003  . TONSILLECTOMY  1952  . TRANSCAROTID ARTERY REVASCULARIZATION Right 11/12/2017   Procedure: TRANSCAROTID ARTERY REVASCULARIZATION;  Surgeon: Waynetta Sandy, MD;  Location: [place];  Service: Vascular;  Laterality: Right;     Current Meds   Medication Sig  . acetaminophen (TYLENOL) 500 MG tablet Take 500 mg by mouth every 6 (six) hours as needed for mild pain.  Marland Kitchen apixaban (ELIQUIS) 5 MG TABS tablet Take 1 tablet (5 mg total) by mouth 2 (two) times daily.  Marland Kitchen atorvastatin (LIPITOR) 20 MG tablet TAKE 1 TABLET (20 MG TOTAL) BY MOUTH DAILY.  . bromocriptine (PARLODEL) 2.5 MG tablet TAKE 1/2 TABLET BY MOUTH AT BEDTIME.  . ciclopirox (LOPROX) 0.77 % cream Apply 1 application topically daily.   . clobetasol (TEMOVATE) 0.05 % external solution Apply topically 2 (two) times daily. (Patient taking differently: Apply 1 application topically 2 (two) times daily. )  . clotrimazole-betamethasone (LOTRISONE) cream Apply 1 application topically 3 (three) times daily as needed. For rash  . glucose blood (ONETOUCH VERIO) test strip 1 each by Other route daily. And lancets 1/day 250.00  . isosorbide mononitrate (IMDUR) 60 MG 24 hr tablet TAKE 1/2 TABLET BY MOUTH EVERY DAY  . losartan (COZAAR) 25 MG tablet Take 1 tablet (25 mg total) by mouth daily.  . metFORMIN (GLUCOPHAGE-XR) 500 MG 24 hr tablet Take 2 tablets (1,000 mg total) by mouth 2 (two) times daily.  . metoprolol succinate (TOPROL-XL) 50 MG 24 hr tablet Take 1 tablet (50 mg total) by mouth 2 (two) times daily. Take with or immediately following a meal.  . repaglinide (PRANDIN) 0.5 MG tablet Take 1 tablet (0.5 mg total) by mouth daily with supper.  . TRAVATAN Z 0.004 % SOLN ophthalmic solution Place 1 drop into both eyes at bedtime.     Allergies:   Levaquin [levofloxacin in d5w] and Tramadol   Social History   Tobacco Use  . Smoking status: Never Smoker  . Smokeless tobacco: Never Used  Substance Use Topics  . Alcohol use: No  . Drug use: No     Family Hx: The patient's family history includes Arthritis in his father and mother; Cancer in an other family member; Heart disease in his father and mother; Hypertension in his father and mother.  ROS:   Please see the history of present  illness.     All other systems reviewed and are negative.   Prior CV studies:   The following studies were reviewed today:    Labs/Other Tests and Data Reviewed:    EKG:  An ECG dated 05/07/2018 was personally reviewed today and demonstrated:  Sinus rhythm with T wave changes suggestive of inferolateral ischemia  Recent Labs: 11/12/2017: ALT 11 05/04/2018: BUN 24; Creatinine, Ser 1.20; Hemoglobin 12.5; Platelets 207; Potassium 4.5; Sodium 142; TSH 3.800   Recent Lipid Panel Lab Results  Component Value Date/Time   CHOL 128 10/21/2017 04:08 AM   TRIG 91 10/21/2017 04:08 AM   HDL 36 (L) 10/21/2017 04:08 AM   CHOLHDL 3.6 10/21/2017 04:08 AM   LDLCALC 74 10/21/2017 04:08 AM   LDLDIRECT 114.4 05/13/2012 02:17 PM    Wt Readings from Last 3 Encounters:  07/27/18 149 lb (67.6 kg)  06/08/18 150 lb 2.1 oz (68.1 kg)  05/07/18 149 lb 9.6 oz (67.9 kg)     Objective:    Vital Signs:  BP 125/68  Pulse 66   Ht 5\' 5"  (1.651 m)   Wt 149 lb (67.6 kg)   BMI 24.79 kg/m    VITAL SIGNS:  reviewed GEN:  no acute distress EYES:  sclerae anicteric, EOMI - Extraocular Movements Intact RESPIRATORY:  normal respiratory effort, symmetric expansion CARDIOVASCULAR:  no peripheral edema NEURO:  alert and oriented x 3, no obvious focal deficit PSYCH:  normal affect  ASSESSMENT & PLAN:    1.    Paroxysmal atrial fibrillation: He is maintaining in sinus rhythm with no recurrent symptoms.  He is tolerating anticoagulation with Eliquis with no side effects.  Chads Vas score is 8.  I am going to order labs on him in about 3 months from now including CBC and basic metabolic profile.  2.  Peripheral arterial disease with minimal claudication: Continue medical therapy  3. Hyperlipidemia: Continue treatment with atorvastatin with a target LDL of less than 70.  4. Coronary artery disease involving native coronary arteries without angina: Continue medical therapy.   5. Essential hypertension:   Blood pressure is well controlled on current medications.  6.  Bilateral carotid disease status post revascularization on the right side.  Followed by [facility].  COVID-19 Education: The signs and symptoms of COVID-19 were discussed with the patient and how to seek care for testing (follow up with PCP or arrange E-visit).  The importance of social distancing was discussed today.  Time:   Today, I have spent 22 minutes with the patient with telehealth technology discussing the above problems.     Medication Adjustments/Labs and Tests Ordered: Current medicines are reviewed at length with the patient today.  Concerns regarding medicines are outlined above.   Tests Ordered: No orders of the defined types were placed in this encounter.   Medication Changes: No orders of the defined types were placed in this encounter.   Disposition:  Follow up in 3 month(s)  Signed, [name], MD  07/27/2018 10:29 AM    [facility]

## 2018-08-16 ENCOUNTER — Telehealth: Payer: Self-pay

## 2018-08-16 NOTE — Telephone Encounter (Signed)
Request placed by pt to after hours service for refill of Metformin. Pt is overdue for an appt and therefore refill cannot be authorized. Called pt and LVM requesting returned call.

## 2018-08-19 ENCOUNTER — Telehealth: Payer: Self-pay | Admitting: Endocrinology

## 2018-08-19 NOTE — Telephone Encounter (Signed)
MEDICATION:metFORMIN (GLUCOPHAGE-XR) 500 MG 24 hr tablet   PHARMACY:   [id number] - [place], [place] [address] (Phone) [phone] (Fax)     IS THIS A 90 DAY SUPPLY : Yes  IS PATIENT OUT OF MEDICATION: Yes  IF NOT; HOW MUCH IS LEFT: None  LAST APPOINTMENT DATE: @5 /02/2019  NEXT APPOINTMENT DATE:@5 /21/2020  DO WE HAVE YOUR PERMISSION TO LEAVE A DETAILED MESSAGE: Yes  OTHER COMMENTS:    **Let patient know to contact pharmacy at the end of the day to make sure medication is ready. **  ** Please notify patient to allow 48-72 hours to process**  **Encourage patient to contact the pharmacy for refills or they can request refills through [facility]**

## 2018-08-20 ENCOUNTER — Other Ambulatory Visit: Payer: Self-pay

## 2018-08-20 DIAGNOSIS — E1151 Type 2 diabetes mellitus with diabetic peripheral angiopathy without gangrene: Secondary | ICD-10-CM

## 2018-08-20 MED ORDER — METFORMIN HCL ER 500 MG PO TB24: 1000.0000 mg | ORAL_TABLET | Freq: Two times a day (BID) | ORAL | 0 refills | Status: DC

## 2018-08-20 NOTE — Telephone Encounter (Signed)
metFORMIN (GLUCOPHAGE-XR) 500 MG 24 hr tablet 120 tablet 0 08/20/2018    Sig - Route: Take 2 tablets (1,000 mg total) by mouth 2 (two) times daily. - Oral   Sent to pharmacy as: metFORMIN (GLUCOPHAGE-XR) 500 MG 24 hr tablet   E-Prescribing Status: Receipt confirmed by pharmacy (08/20/2018 9:39 AM EDT)

## 2018-08-25 ENCOUNTER — Encounter: Payer: Self-pay | Admitting: Endocrinology

## 2018-08-26 ENCOUNTER — Encounter: Payer: Medicare HMO | Admitting: Endocrinology

## 2018-08-31 ENCOUNTER — Ambulatory Visit: Payer: Medicare Other | Admitting: Internal Medicine

## 2018-09-01 ENCOUNTER — Other Ambulatory Visit: Payer: Self-pay

## 2018-09-01 ENCOUNTER — Ambulatory Visit (INDEPENDENT_AMBULATORY_CARE_PROVIDER_SITE_OTHER): Payer: Medicare HMO | Admitting: Endocrinology

## 2018-09-01 ENCOUNTER — Ambulatory Visit (INDEPENDENT_AMBULATORY_CARE_PROVIDER_SITE_OTHER): Payer: Medicare HMO | Admitting: Internal Medicine

## 2018-09-01 DIAGNOSIS — E611 Iron deficiency: Secondary | ICD-10-CM

## 2018-09-01 DIAGNOSIS — E559 Vitamin D deficiency, unspecified: Secondary | ICD-10-CM | POA: Diagnosis not present

## 2018-09-01 DIAGNOSIS — E1151 Type 2 diabetes mellitus with diabetic peripheral angiopathy without gangrene: Secondary | ICD-10-CM | POA: Diagnosis not present

## 2018-09-01 DIAGNOSIS — E538 Deficiency of other specified B group vitamins: Secondary | ICD-10-CM | POA: Diagnosis not present

## 2018-09-01 DIAGNOSIS — E1159 Type 2 diabetes mellitus with other circulatory complications: Secondary | ICD-10-CM | POA: Diagnosis not present

## 2018-09-01 DIAGNOSIS — E1129 Type 2 diabetes mellitus with other diabetic kidney complication: Secondary | ICD-10-CM

## 2018-09-01 DIAGNOSIS — Z0000 Encounter for general adult medical examination without abnormal findings: Secondary | ICD-10-CM | POA: Diagnosis not present

## 2018-09-01 DIAGNOSIS — R54 Age-related physical debility: Secondary | ICD-10-CM | POA: Diagnosis not present

## 2018-09-01 MED ORDER — LOSARTAN POTASSIUM 25 MG PO TABS: 25.0000 mg | ORAL_TABLET | Freq: Every day | ORAL | 3 refills | Status: DC

## 2018-09-01 MED ORDER — GLUCOSE BLOOD VI STRP: 1.0000 | ORAL_STRIP | Freq: Every day | 12 refills | Status: AC

## 2018-09-01 MED ORDER — METFORMIN HCL ER 500 MG PO TB24: 1000.0000 mg | ORAL_TABLET | Freq: Every day | ORAL | 3 refills | Status: DC

## 2018-09-01 NOTE — Patient Instructions (Addendum)

## 2018-09-01 NOTE — Progress Notes (Signed)
Subjective:    Patient ID: Jeffrey Kaiser, male    DOB: 06/14/1925, 85 y.o.   MRN: [id number]  HPI  telehealth visit today via doxy video visit.  Alternatives to telehealth are presented to this patient, and the patient agrees to the telehealth visit. Pt is advised of the cost of the visit, and agrees to this, also.   Patient is at home, and I am at the office.   Persons attending the telehealth visit: the patient and I Pt returns for f/u of diabetes mellitus:  DM type: 2 Dx'ed: 7858 Complications: retinopathy, renal insufficiency, CAD, and PAD.  Therapy: 3 oral meds.  DKA: never.   Severe hypoglycemia: never.  Pancreatitis: never.   Other: he has never been on insulin; invokana rx is limited by hyperkalemia.   Interval history: no cbg record, but states cbg's are approx 100.  He takes meds as rx'ed, except he is still on repaglinde TID.   Past Medical History:  Diagnosis Date  . Anemia 09/04/2010  . Arthritis   . BPH (benign prostatic hyperplasia) 09/09/2010  . CAD (coronary artery disease)   . DDD (degenerative disc disease), cervical 09/04/2010  . Diabetes mellitus   . Diastolic dysfunction 8/50/2774  . Diverticulitis   . Glaucoma   . Hernia   . History of colonic diverticulitis 09/04/2010  . Hyperlipidemia   . Hypertension   . Psoriasis 09/04/2010  . Stroke [facility])     Past Surgical History:  Procedure Laterality Date  . broken legs  1981 and 1995  . PERIPHERAL VASCULAR CATHETERIZATION N/A 10/25/2014   Procedure: Abdominal Aortogram;  Surgeon: Wellington Hampshire, MD;  Location: [place] [facility];  Service: Cardiovascular;  Laterality: N/A;  . PERIPHERAL VASCULAR CATHETERIZATION Bilateral 10/25/2014   Procedure: Lower Extremity Angiography;  Surgeon: Wellington Hampshire, MD;  Location: [place] [facility];  Service: Cardiovascular;  Laterality: Bilateral;  . s/p lipoma  2003  . TONSILLECTOMY  1952  . TRANSCAROTID ARTERY REVASCULARIZATION Right 11/12/2017   Procedure:  TRANSCAROTID ARTERY REVASCULARIZATION;  Surgeon: Waynetta Sandy, MD;  Location: [place];  Service: Vascular;  Laterality: Right;    Social History   Socioeconomic History  . Marital status: Single    Spouse name: Not on file  . Number of children: Not on file  . Years of education: Not on file  . Highest education level: Not on file  Occupational History  . Occupation: retired  Scientific laboratory technician  . Financial resource strain: Not on file  . Food insecurity:    Worry: Not on file    Inability: Not on file  . Transportation needs:    Medical: Not on file    Non-medical: Not on file  Tobacco Use  . Smoking status: Never Smoker  . Smokeless tobacco: Never Used  Substance and Sexual Activity  . Alcohol use: No  . Drug use: No  . Sexual activity: Never  Lifestyle  . Physical activity:    Days per week: Not on file    Minutes per session: Not on file  . Stress: Not on file  Relationships  . Social connections:    Talks on phone: Not on file    Gets together: Not on file    Attends religious service: Not on file    Active member of club or organization: Not on file    Attends meetings of clubs or organizations: Not on file    Relationship status: Not on file  . Intimate partner  violence:    Fear of current or ex partner: Not on file    Emotionally abused: Not on file    Physically abused: Not on file    Forced sexual activity: Not on file  Other Topics Concern  . Not on file  Social History Narrative  . Not on file    Current Outpatient Medications on File Prior to Visit  Medication Sig Dispense Refill  . acetaminophen (TYLENOL) 500 MG tablet Take 500 mg by mouth every 6 (six) hours as needed for mild pain.    Marland Kitchen apixaban (ELIQUIS) 5 MG TABS tablet Take 1 tablet (5 mg total) by mouth 2 (two) times daily. 60 tablet 11  . atorvastatin (LIPITOR) 20 MG tablet TAKE 1 TABLET (20 MG TOTAL) BY MOUTH DAILY. 90 tablet 2  . bromocriptine (PARLODEL) 2.5 MG tablet TAKE 1/2  TABLET BY MOUTH AT BEDTIME. 15 tablet 0  . ciclopirox (LOPROX) 0.77 % cream Apply 1 application topically daily.     . clobetasol (TEMOVATE) 0.05 % external solution Apply topically 2 (two) times daily. (Patient taking differently: Apply 1 application topically 2 (two) times daily. ) 50 mL 1  . clotrimazole-betamethasone (LOTRISONE) cream Apply 1 application topically 3 (three) times daily as needed. For rash 45 g 2  . isosorbide mononitrate (IMDUR) 60 MG 24 hr tablet TAKE 1/2 TABLET BY MOUTH EVERY DAY 15 tablet 2  . metoprolol succinate (TOPROL-XL) 50 MG 24 hr tablet Take 1 tablet (50 mg total) by mouth 2 (two) times daily. Take with or immediately following a meal. 180 tablet 1  . repaglinide (PRANDIN) 0.5 MG tablet Take 1 tablet (0.5 mg total) by mouth daily with supper. 90 tablet 3  . TRAVATAN Z 0.004 % SOLN ophthalmic solution Place 1 drop into both eyes at bedtime.  4  . losartan (COZAAR) 25 MG tablet Take 1 tablet (25 mg total) by mouth daily. 90 tablet 3   No current facility-administered medications on file prior to visit.     Allergies  Allergen Reactions  . Levaquin [Levofloxacin In D5w] Nausea Only  . Tramadol Other (See Comments)    somnolence    Family History  Problem Relation Age of Onset  . Arthritis Mother   . Heart disease Mother   . Hypertension Mother   . Arthritis Father   . Heart disease Father   . Hypertension Father   . Cancer Other        lung cancer     Review of Systems He denies hypoglycemia    Objective:   Physical Exam   Lab Results  Component Value Date   CREATININE 1.20 05/04/2018   BUN 24 05/04/2018   NA 142 05/04/2018   K 4.5 05/04/2018   CL 99 05/04/2018   CO2 22 05/04/2018       Assessment & Plan:  Type 2 DM, with PAD: well-controlled Frail elderly state: I told pt he does not need such aggressive glycemic control Renal insuff: we should reduce metformin.  Patient Instructions  check your blood sugar once a day.  vary the  time of day when you check, between before the 3 meals, and at bedtime.  also check if you have symptoms of your blood sugar being too high or too low.  please keep a record of the readings and bring it to your next appointment here (or you can bring the meter itself).  You can write it on any piece of paper.  please call us sooner if  your blood sugar goes below 70, or if you have a lot of readings over 200. Please reduce the repaglinide to supper only, and: I have sent a prescription to your pharmacy, to reduce the metformin to 2 pills per day.   Please continue the same other diabetes medications.   Please come back for a follow-up appointment in 2 months.

## 2018-09-01 NOTE — Patient Instructions (Addendum)
check your blood sugar once a day.  vary the time of day when you check, between before the 3 meals, and at bedtime.  also check if you have symptoms of your blood sugar being too high or too low.  please keep a record of the readings and bring it to your next appointment here (or you can bring the meter itself).  You can write it on any piece of paper.  please call us sooner if your blood sugar goes below 70, or if you have a lot of readings over 200. Please reduce the repaglinide to supper only, and: I have sent a prescription to your pharmacy, to reduce the metformin to 2 pills per day.   Please continue the same other diabetes medications.   Please come back for a follow-up appointment in 2 months.

## 2018-09-01 NOTE — Progress Notes (Signed)
Patient ID: Jeffrey Kaiser, male   DOB: 09-Jun-1925, 85 y.o.   MRN: [id number] \\Virtual  Visit via Video Note  I connected with [id number] on 09/01/18 at  8:20 AM EDT by a video enabled telemedicine application and verified that I am speaking with the correct person using two identifiers.  Location: Patient: at home with daughter who gives hx and pt confirms Provider: at office   I discussed the limitations of evaluation and management by telemedicine and the availability of in person appointments. The patient expressed understanding and agreed to proceed.  History of Present Illness: Here for wellness and f/u;  Overall doing ok;  Pt denies Chest pain, worsening SOB, DOE, wheezing, orthopnea, PND, worsening LE edema, palpitations, dizziness or syncope.  Pt denies neurological change such as new headache, facial or extremity weakness.  Pt denies polydipsia, polyuria, or low sugar symptoms. Pt states overall good compliance with treatment and medications, good tolerability, and has been trying to follow appropriate diet.  Pt denies worsening depressive symptoms, suicidal ideation or panic. No fever, night sweats, wt loss, loss of appetite, or other constitutional symptoms.  Pt states good ability with ADL's, has low fall risk, home safety reviewed and adequate, no other significant changes in hearing or vision, and only occasionally active with exercise. No new complaints   Past Medical History:  Diagnosis Date  . Anemia 09/04/2010  . Arthritis   . BPH (benign prostatic hyperplasia) 09/09/2010  . CAD (coronary artery disease)   . DDD (degenerative disc disease), cervical 09/04/2010  . Diabetes mellitus   . Diastolic dysfunction 09/17/2010  . Diverticulitis   . Glaucoma   . Hernia   . History of colonic diverticulitis 09/04/2010  . Hyperlipidemia   . Hypertension   . Psoriasis 09/04/2010  . Stroke [facility])    Past Surgical History:  Procedure Laterality Date  . broken legs  1981 and 1995  .  PERIPHERAL VASCULAR CATHETERIZATION N/A 10/25/2014   Procedure: Abdominal Aortogram;  Surgeon: 11/07/2014, MD;  Location: [place] [facility];  Service: Cardiovascular;  Laterality: N/A;  . PERIPHERAL VASCULAR CATHETERIZATION Bilateral 10/25/2014   Procedure: Lower Extremity Angiography;  Surgeon: 11/07/2014, MD;  Location: [place] [facility];  Service: Cardiovascular;  Laterality: Bilateral;  . s/p lipoma  2003  . TONSILLECTOMY  1952  . TRANSCAROTID ARTERY REVASCULARIZATION Right 11/12/2017   Procedure: TRANSCAROTID ARTERY REVASCULARIZATION;  Surgeon: 21/11/2017, MD;  Location: [place];  Service: Vascular;  Laterality: Right;    reports that he has never smoked. He has never used smokeless tobacco. He reports that he does not drink alcohol or use drugs. family history includes Arthritis in his father and mother; Cancer in an other family member; Heart disease in his father and mother; Hypertension in his father and mother. Allergies  Allergen Reactions  . Levaquin [Levofloxacin In D5w] Nausea Only  . Tramadol Other (See Comments)    somnolence   Current Outpatient Medications on File Prior to Visit  Medication Sig Dispense Refill  . acetaminophen (TYLENOL) 500 MG tablet Take 500 mg by mouth every 6 (six) hours as needed for mild pain.    [address] apixaban (ELIQUIS) 5 MG TABS tablet Take 1 tablet (5 mg total) by mouth 2 (two) times daily. 60 tablet 11  . atorvastatin (LIPITOR) 20 MG tablet TAKE 1 TABLET (20 MG TOTAL) BY MOUTH DAILY. 90 tablet 2  . bromocriptine (PARLODEL) 2.5 MG tablet TAKE 1/2 TABLET BY MOUTH AT BEDTIME. 15  tablet 0  . ciclopirox (LOPROX) 0.77 % cream Apply 1 application topically daily.     . clobetasol (TEMOVATE) 0.05 % external solution Apply topically 2 (two) times daily. (Patient taking differently: Apply 1 application topically 2 (two) times daily. ) 50 mL 1  . clotrimazole-betamethasone (LOTRISONE) cream Apply 1 application topically 3 (three) times  daily as needed. For rash 45 g 2  . isosorbide mononitrate (IMDUR) 60 MG 24 hr tablet TAKE 1/2 TABLET BY MOUTH EVERY DAY 15 tablet 2  . metoprolol succinate (TOPROL-XL) 50 MG 24 hr tablet Take 1 tablet (50 mg total) by mouth 2 (two) times daily. Take with or immediately following a meal. 180 tablet 1  . repaglinide (PRANDIN) 0.5 MG tablet Take 1 tablet (0.5 mg total) by mouth daily with supper. 90 tablet 3  . TRAVATAN Z 0.004 % SOLN ophthalmic solution Place 1 drop into both eyes at bedtime.  4   No current facility-administered medications on file prior to visit.    Observations/Objective: Alert, NAD, appropriate mood and affect, resps normal, cn 2-12 intact, moves all 4s, no visible rash or swelling Lab Results  Component Value Date   WBC 8.8 05/04/2018   HGB 12.5 (L) 05/04/2018   HCT 39.3 05/04/2018   PLT 207 05/04/2018   GLUCOSE 193 (H) 05/04/2018   CHOL 128 10/21/2017   TRIG 91 10/21/2017   HDL 36 (L) 10/21/2017   LDLDIRECT 114.4 05/13/2012   LDLCALC 74 10/21/2017   ALT 11 11/12/2017   AST 22 11/12/2017   NA 142 05/04/2018   K 4.5 05/04/2018   CL 99 05/04/2018   CREATININE 1.20 05/04/2018   BUN 24 05/04/2018   CO2 22 05/04/2018   TSH 3.800 05/04/2018   INR 1.05 11/12/2017   HGBA1C 6.8 (A) 02/22/2018   MICROALBUR 1.5 10/05/2014    Assessment and Plan: See notes  Follow Up Instructions: See notes   I discussed the assessment and treatment plan with the patient. The patient was provided an opportunity to ask questions and all were answered. The patient agreed with the plan and demonstrated an understanding of the instructions.   The patient was advised to call back or seek an in-person evaluation if the symptoms worsen or if the condition fails to improve as anticipated.  Cathlean Cower, MD

## 2018-09-02 ENCOUNTER — Other Ambulatory Visit: Payer: Self-pay | Admitting: Internal Medicine

## 2018-09-02 ENCOUNTER — Other Ambulatory Visit (INDEPENDENT_AMBULATORY_CARE_PROVIDER_SITE_OTHER): Payer: Medicare HMO

## 2018-09-02 DIAGNOSIS — E1159 Type 2 diabetes mellitus with other circulatory complications: Secondary | ICD-10-CM

## 2018-09-02 DIAGNOSIS — E559 Vitamin D deficiency, unspecified: Secondary | ICD-10-CM | POA: Diagnosis not present

## 2018-09-02 DIAGNOSIS — E538 Deficiency of other specified B group vitamins: Secondary | ICD-10-CM | POA: Diagnosis not present

## 2018-09-02 DIAGNOSIS — E611 Iron deficiency: Secondary | ICD-10-CM

## 2018-09-02 DIAGNOSIS — Z0000 Encounter for general adult medical examination without abnormal findings: Secondary | ICD-10-CM | POA: Diagnosis not present

## 2018-09-02 DIAGNOSIS — E1151 Type 2 diabetes mellitus with diabetic peripheral angiopathy without gangrene: Secondary | ICD-10-CM

## 2018-09-02 LAB — CBC WITH DIFFERENTIAL/PLATELET
Basophils Absolute: 0 10*3/uL (ref 0.0–0.1)
Basophils Relative: 0.3 % (ref 0.0–3.0)
Eosinophils Absolute: 0.1 10*3/uL (ref 0.0–0.7)
Eosinophils Relative: 0.9 % (ref 0.0–5.0)
HCT: 35.9 % — ABNORMAL LOW (ref 39.0–52.0)
Hemoglobin: 12.2 g/dL — ABNORMAL LOW (ref 13.0–17.0)
Lymphocytes Relative: 21.2 % (ref 12.0–46.0)
Lymphs Abs: 1.8 10*3/uL (ref 0.7–4.0)
MCHC: 33.9 g/dL (ref 30.0–36.0)
MCV: 85.9 fl (ref 78.0–100.0)
Monocytes Absolute: 0.7 10*3/uL (ref 0.1–1.0)
Monocytes Relative: 8.4 % (ref 3.0–12.0)
Neutro Abs: 6 10*3/uL (ref 1.4–7.7)
Neutrophils Relative %: 69.2 % (ref 43.0–77.0)
Platelets: 152 10*3/uL (ref 150.0–400.0)
RBC: 4.17 Mil/uL — ABNORMAL LOW (ref 4.22–5.81)
RDW: 14.8 % (ref 11.5–15.5)
WBC: 8.6 10*3/uL (ref 4.0–10.5)

## 2018-09-02 LAB — BASIC METABOLIC PANEL
BUN: 17 mg/dL (ref 6–23)
CO2: 29 mEq/L (ref 19–32)
Calcium: 9.4 mg/dL (ref 8.4–10.5)
Chloride: 103 mEq/L (ref 96–112)
Creatinine, Ser: 0.85 mg/dL (ref 0.40–1.50)
GFR: 84.45 mL/min (ref >60.00)
Glucose, Bld: 218 mg/dL — ABNORMAL HIGH (ref 70–99)
Potassium: 4 mEq/L (ref 3.5–5.1)
Sodium: 141 mEq/L (ref 135–145)

## 2018-09-02 LAB — IBC PANEL
Iron: 47 ug/dL (ref 42–165)
Saturation Ratios: 13.6 % — ABNORMAL LOW (ref 20.0–50.0)
Transferrin: 246 mg/dL (ref 212.0–360.0)

## 2018-09-02 LAB — LIPID PANEL
Cholesterol: 148 mg/dL (ref 0–200)
HDL: 43.4 mg/dL (ref >39.00)
LDL Cholesterol: 77 mg/dL (ref 0–99)
NonHDL: 104.85
Total CHOL/HDL Ratio: 3
Triglycerides: 140 mg/dL (ref 0.0–149.0)
VLDL: 28 mg/dL (ref 0.0–40.0)

## 2018-09-02 LAB — URINALYSIS, ROUTINE W REFLEX MICROSCOPIC
Bilirubin Urine: NEGATIVE
Hgb urine dipstick: NEGATIVE
Ketones, ur: NEGATIVE
Leukocytes,Ua: NEGATIVE
Nitrite: NEGATIVE
RBC / HPF: NONE SEEN (ref 0–?)
Specific Gravity, Urine: 1.025 (ref 1.000–1.030)
Total Protein, Urine: NEGATIVE
Urine Glucose: 250 — AB
Urobilinogen, UA: 0.2 (ref 0.0–1.0)
pH: 5.5 (ref 5.0–8.0)

## 2018-09-02 LAB — HEMOGLOBIN A1C: Hgb A1c MFr Bld: 7.9 % — ABNORMAL HIGH (ref 4.6–6.5)

## 2018-09-02 LAB — HEPATIC FUNCTION PANEL
ALT: 11 U/L (ref 0–53)
AST: 16 U/L (ref 0–37)
Albumin: 3.9 g/dL (ref 3.5–5.2)
Alkaline Phosphatase: 52 U/L (ref 39–117)
Bilirubin, Direct: 0.1 mg/dL (ref 0.0–0.3)
Total Bilirubin: 0.5 mg/dL (ref 0.2–1.2)
Total Protein: 7 g/dL (ref 6.0–8.3)

## 2018-09-02 LAB — MICROALBUMIN / CREATININE URINE RATIO
Creatinine,U: 121.7 mg/dL
Microalb Creat Ratio: 3.4 mg/g (ref 0.0–30.0)
Microalb, Ur: 4.2 mg/dL — ABNORMAL HIGH (ref 0.0–1.9)

## 2018-09-02 LAB — VITAMIN D 25 HYDROXY (VIT D DEFICIENCY, FRACTURES): VITD: 37.19 ng/mL (ref 30.00–100.00)

## 2018-09-02 LAB — TSH: TSH: 2.72 u[IU]/mL (ref 0.35–4.50)

## 2018-09-02 LAB — VITAMIN B12: Vitamin B-12: 215 pg/mL (ref 211–911)

## 2018-09-02 MED ORDER — METFORMIN HCL ER 500 MG PO TB24: 1500.0000 mg | ORAL_TABLET | Freq: Every day | ORAL | 3 refills | Status: DC

## 2018-09-03 ENCOUNTER — Telehealth: Payer: Self-pay

## 2018-09-03 NOTE — Telephone Encounter (Signed)
-----   Message from [name], MD sent at 09/02/2018  5:02 PM EDT ----- Left message on MyChart, pt to cont same tx except  The test results show that your current treatment is OK, except the A1c is mildly high.  Please increase the metformin ER 500 mg to 3 pills in the AM.  I will send a new prescription, and you should hear from the office as well.Redmond Baseman to please inform pt, I will do rx

## 2018-09-03 NOTE — Telephone Encounter (Signed)
Called pt's daughter, Joseph Art, LVM.   CRM created.

## 2018-09-04 ENCOUNTER — Encounter: Payer: Self-pay | Admitting: Internal Medicine

## 2018-09-04 NOTE — Assessment & Plan Note (Signed)
stable overall by history and exam, recent data reviewed with pt, and pt to continue medical treatment as before,  to f/u any worsening symptoms or concerns  

## 2018-09-04 NOTE — Assessment & Plan Note (Signed)

## 2018-09-10 ENCOUNTER — Telehealth: Payer: Self-pay | Admitting: Internal Medicine

## 2018-09-10 NOTE — Telephone Encounter (Signed)
Lab results given to daughter, Joseph Art. Verbalizes understanding.

## 2018-09-13 ENCOUNTER — Other Ambulatory Visit: Payer: Self-pay | Admitting: Endocrinology

## 2018-09-13 DIAGNOSIS — E1151 Type 2 diabetes mellitus with diabetic peripheral angiopathy without gangrene: Secondary | ICD-10-CM

## 2018-09-14 DIAGNOSIS — H01009 Unspecified blepharitis unspecified eye, unspecified eyelid: Secondary | ICD-10-CM | POA: Diagnosis not present

## 2018-09-14 DIAGNOSIS — H401132 Primary open-angle glaucoma, bilateral, moderate stage: Secondary | ICD-10-CM | POA: Diagnosis not present

## 2018-09-17 ENCOUNTER — Other Ambulatory Visit: Payer: Self-pay | Admitting: Internal Medicine

## 2018-10-12 ENCOUNTER — Telehealth (INDEPENDENT_AMBULATORY_CARE_PROVIDER_SITE_OTHER): Payer: Medicare HMO | Admitting: Cardiovascular Disease

## 2018-10-12 VITALS — BP 138/73 | HR 64 | Ht 65.0 in | Wt 148.0 lb

## 2018-10-12 DIAGNOSIS — I480 Paroxysmal atrial fibrillation: Secondary | ICD-10-CM

## 2018-10-12 DIAGNOSIS — I10 Essential (primary) hypertension: Secondary | ICD-10-CM

## 2018-10-12 DIAGNOSIS — I2510 Atherosclerotic heart disease of native coronary artery without angina pectoris: Secondary | ICD-10-CM

## 2018-10-12 DIAGNOSIS — I739 Peripheral vascular disease, unspecified: Secondary | ICD-10-CM

## 2018-10-12 NOTE — Patient Instructions (Signed)
Medication Instructions:  The current medical regimen is effective;  continue present plan and medications.  If you need a refill on your cardiac medications before your next appointment, please call your pharmacy.   Follow-Up: At [facility], you and your health needs are our priority.  As part of our continuing mission to provide you with exceptional heart care, we have created designated Provider Care Teams.  These Care Teams include your primary Cardiologist (physician) and Advanced Practice Providers (APPs -  Physician Assistants and Nurse Practitioners) who all work together to provide you with the care you need, when you need it. You will need a follow up appointment in 6 months.  Please call our office 2 months in advance to schedule this appointment.  You may see [name], MD or one of the following Advanced Practice Providers on your designated Care Team:   Kerin Ransom, PA-C Roby Lofts, [place] . Sande Rives, PA-C

## 2018-10-12 NOTE — Progress Notes (Signed)
Virtual Visit via Video Note   This visit type was conducted due to national recommendations for restrictions regarding the COVID-19 Pandemic (e.g. social distancing) in an effort to limit this patient's exposure and mitigate transmission in our community.  Due to his co-morbid illnesses, this patient is at least at moderate risk for complications without adequate follow up.  This format is felt to be most appropriate for this patient at this time.  All issues noted in this document were discussed and addressed.  A limited physical exam was performed with this format.  Please refer to the patient's chart for his consent to telehealth for [facility].   Evaluation Performed:  Follow-up visit  Date:  10/12/2018   ID:  Jeffrey Kaiser, Jeffrey Kaiser, MRN [id number]  Patient Location: Home Provider Location: Office  PCP:  [name], MD  Cardiologist:  [name], MD  Electrophysiologist:  None   Chief Complaint: Follow-up  History of Present Illness:    Jeffrey Kaiser is a 85 y.o. male with history of peripheral arterial disease and paroxysmal atrial fibrillation who is seen via a video visit.  The connection while slow and I had to switch to a phone visit.Marland Kitchen He has reported history of coronary artery disease with no available details, prolonged history of diabetes, hyperlipidemia, hypertension and arthritis. He has no history of tobacco use.  He was seen in 2016 for severe left calf claudication. Noninvasive vascular evaluation showed an ABI of 0.93 on the right and 0.31 on the left.  Angiography in 7/16  showed: 1. No significant aortoiliac disease. 2. Moderate distal left common femoral artery stenosis. Mild nonobstructive disease in the left SFA. Subtotal occlusion of the left popliteal artery proximally with 1 vessel runoff below the knee. 3. Mild nonobstructive disease affecting the right SFA and popliteal arteries. 2 vessel runoff below the knee on the right. The patient was  treated medically.  The patient had a TIA last year  and was diagnosed with severe bilateral carotid disease.  He underwent TCAR on right carotid artery by Dr. Donzetta Matters.   The patient was diagnosed with atrial fibrillation last year but converted to sinus rhythm after doubling the dose of Toprol.  He has been tolerating anticoagulation with Eliquis.  He has been doing reasonably well and denies chest pain, shortness of breath or palpitations.  No significant calf claudication.  He is bothered by neck and back pain.  The patient does not have symptoms concerning for COVID-19 infection (fever, chills, cough, or new shortness of breath).    Past Medical History:  Diagnosis Date  . Anemia 09/04/2010  . Arthritis   . BPH (benign prostatic hyperplasia) 09/09/2010  . CAD (coronary artery disease)   . DDD (degenerative disc disease), cervical 09/04/2010  . Diabetes mellitus   . Diastolic dysfunction 2/95/2841  . Diverticulitis   . Glaucoma   . Hernia   . History of colonic diverticulitis 09/04/2010  . Hyperlipidemia   . Hypertension   . Psoriasis 09/04/2010  . Stroke [place])    Past Surgical History:  Procedure Laterality Date  . broken legs  1981 and 1995  . PERIPHERAL VASCULAR CATHETERIZATION N/A 10/25/2014   Procedure: Abdominal Aortogram;  Surgeon: Wellington Hampshire, MD;  Location: [place] [facility];  Service: Cardiovascular;  Laterality: N/A;  . PERIPHERAL VASCULAR CATHETERIZATION Bilateral 10/25/2014   Procedure: Lower Extremity Angiography;  Surgeon: Wellington Hampshire, MD;  Location: [place] [facility];  Service: Cardiovascular;  Laterality: Bilateral;  .  s/p lipoma  2003  . TONSILLECTOMY  1952  . TRANSCAROTID ARTERY REVASCULARIZATION Right 11/12/2017   Procedure: TRANSCAROTID ARTERY REVASCULARIZATION;  Surgeon: Waynetta Sandy, MD;  Location: [place];  Service: Vascular;  Laterality: Right;     Current Meds  Medication Sig  . acetaminophen (TYLENOL) 500 MG tablet Take 500 mg by  mouth every 6 (six) hours as needed for mild pain.  Marland Kitchen apixaban (ELIQUIS) 5 MG TABS tablet Take 1 tablet (5 mg total) by mouth 2 (two) times daily.  Marland Kitchen atorvastatin (LIPITOR) 20 MG tablet TAKE 1 TABLET (20 MG TOTAL) BY MOUTH DAILY.  . bromocriptine (PARLODEL) 2.5 MG tablet TAKE 1/2 TABLET BY MOUTH AT BEDTIME.  . ciclopirox (LOPROX) 0.77 % cream Apply 1 application topically daily.   . clobetasol (TEMOVATE) 0.05 % external solution Apply topically 2 (two) times daily. (Patient taking differently: Apply 1 application topically 2 (two) times daily. )  . clotrimazole-betamethasone (LOTRISONE) cream Apply 1 application topically 3 (three) times daily as needed. For rash  . glucose blood (ONETOUCH VERIO) test strip 1 each by Other route daily. And lancets 1/day 250.00  . isosorbide mononitrate (IMDUR) 60 MG 24 hr tablet TAKE 1/2 TABLET BY MOUTH EVERY DAY  . losartan (COZAAR) 25 MG tablet Take 1 tablet (25 mg total) by mouth daily.  . metFORMIN (GLUCOPHAGE-XR) 500 MG 24 hr tablet Take 1,500 mg by mouth daily with breakfast.  . metoprolol succinate (TOPROL-XL) 50 MG 24 hr tablet Take 1 tablet (50 mg total) by mouth 2 (two) times daily. Take with or immediately following a meal.  . repaglinide (PRANDIN) 0.5 MG tablet Take 1 tablet (0.5 mg total) by mouth daily with supper.  . TRAVATAN Z 0.004 % SOLN ophthalmic solution Place 1 drop into both eyes at bedtime.     Allergies:   Levaquin [levofloxacin in d5w] and Tramadol   Social History   Tobacco Use  . Smoking status: Never Smoker  . Smokeless tobacco: Never Used  Substance Use Topics  . Alcohol use: No  . Drug use: No     Family Hx: The patient's family history includes Arthritis in his father and mother; Cancer in an other family member; Heart disease in his father and mother; Hypertension in his father and mother.  ROS:   Please see the history of present illness.     All other systems reviewed and are negative.   Prior CV studies:   The  following studies were reviewed today:    Labs/Other Tests and Data Reviewed:    EKG:  An ECG dated 05/07/2018 was personally reviewed today and demonstrated:  Sinus rhythm with T wave changes suggestive of inferolateral ischemia  Recent Labs: 09/02/2018: ALT 11; BUN 17; Creatinine, Ser 0.85; Hemoglobin 12.2; Platelets 152.0; Potassium 4.0; Sodium 141; TSH 2.72   Recent Lipid Panel Lab Results  Component Value Date/Time   CHOL 148 09/02/2018 02:10 PM   TRIG 140.0 09/02/2018 02:10 PM   HDL 43.40 09/02/2018 02:10 PM   CHOLHDL 3 09/02/2018 02:10 PM   LDLCALC 77 09/02/2018 02:10 PM   LDLDIRECT 114.4 05/13/2012 02:17 PM    Wt Readings from Last 3 Encounters:  10/12/18 148 lb (67.1 kg)  07/27/18 149 lb (67.6 kg)  06/08/18 150 lb 2.1 oz (68.1 kg)     Objective:    Vital Signs:  BP 138/73   Pulse 64   Ht 5\' 5"  (1.651 m)   Wt 148 lb (67.1 kg)   BMI 24.63 kg/m  VITAL SIGNS:  reviewed GEN:  no acute distress EYES:  sclerae anicteric, EOMI - Extraocular Movements Intact RESPIRATORY:  normal respiratory effort, symmetric expansion CARDIOVASCULAR:  no peripheral edema NEURO:  alert and oriented x 3, no obvious focal deficit PSYCH:  normal affect  ASSESSMENT & PLAN:    1.    Paroxysmal atrial fibrillation: He is maintaining in sinus rhythm with no recurrent symptoms.  He is tolerating anticoagulation with Eliquis with no side effects.  Chads Vas score is 8.  I reviewed his labs from May which showed stable hemoglobin and normal renal function.  2.  Peripheral arterial disease with minimal claudication: Continue medical therapy  3. Hyperlipidemia: Continue treatment with atorvastatin with a target LDL of less than 70.  Most recent lipid profile showed an LDL of 77.  4. Coronary artery disease involving native coronary arteries without angina: Continue medical therapy.   5. Essential hypertension:  Blood pressure is well controlled on current medications.  6.  Bilateral  carotid disease status post revascularization on the right side.  Followed by [facility].  COVID-19 Education: The signs and symptoms of COVID-19 were discussed with the patient and how to seek care for testing (follow up with PCP or arrange E-visit).  The importance of social distancing was discussed today.  Time:   Today, I have spent 10 minutes with the patient with telehealth technology discussing the above problems.     Medication Adjustments/Labs and Tests Ordered: Current medicines are reviewed at length with the patient today.  Concerns regarding medicines are outlined above.   Tests Ordered: No orders of the defined types were placed in this encounter.   Medication Changes: No orders of the defined types were placed in this encounter.   Disposition:  Follow up in 6 months  Signed, [name], MD  10/12/2018 11:11 AM    [place]

## 2018-10-20 ENCOUNTER — Other Ambulatory Visit: Payer: Self-pay | Admitting: Internal Medicine

## 2018-11-02 ENCOUNTER — Other Ambulatory Visit: Payer: Self-pay | Admitting: Endocrinology

## 2018-11-04 ENCOUNTER — Other Ambulatory Visit: Payer: Self-pay

## 2018-11-04 MED ORDER — METOPROLOL SUCCINATE ER 50 MG PO TB24: 50.0000 mg | ORAL_TABLET | Freq: Two times a day (BID) | ORAL | 1 refills | Status: AC

## 2018-12-01 ENCOUNTER — Other Ambulatory Visit: Payer: Self-pay | Admitting: Endocrinology

## 2018-12-01 ENCOUNTER — Encounter: Payer: Self-pay | Admitting: Endocrinology

## 2018-12-02 ENCOUNTER — Telehealth: Payer: Self-pay

## 2018-12-02 NOTE — Telephone Encounter (Signed)
Following My Chart message received from dtr and forwarded to Dr. Loanne Drilling:  Hello Dr. Loanne Drilling...  My dad wanted me to check with you regarding the information [employer] needs to order his shoes for diabetics. He said that they called him today and had not received it yet. I know it is a busy time. He wanted to see if you need anything else from him before it can be sent in. Let me know if there is anything that is needed. Thank you for your assistance. Renee    Per Dr. Loanne Drilling, pt will require an appt to further discuss and address. Message being routed to front desk for scheduling purposes. Also responded to dtr via My Chart.

## 2018-12-02 NOTE — Telephone Encounter (Signed)
Completed.

## 2018-12-02 NOTE — Telephone Encounter (Signed)
Please advise 

## 2018-12-06 ENCOUNTER — Ambulatory Visit: Payer: Medicare HMO | Admitting: Endocrinology

## 2018-12-14 ENCOUNTER — Ambulatory Visit: Payer: Medicare HMO | Admitting: Family

## 2018-12-14 ENCOUNTER — Encounter (HOSPITAL_COMMUNITY): Payer: Medicare HMO

## 2018-12-16 ENCOUNTER — Encounter: Payer: Self-pay | Admitting: Endocrinology

## 2018-12-16 ENCOUNTER — Other Ambulatory Visit: Payer: Self-pay

## 2018-12-16 ENCOUNTER — Ambulatory Visit (INDEPENDENT_AMBULATORY_CARE_PROVIDER_SITE_OTHER): Payer: Medicare HMO | Admitting: Endocrinology

## 2018-12-16 VITALS — Ht 65.0 in | Wt 150.0 lb

## 2018-12-16 DIAGNOSIS — E1159 Type 2 diabetes mellitus with other circulatory complications: Secondary | ICD-10-CM | POA: Diagnosis not present

## 2018-12-16 MED ORDER — METFORMIN HCL ER 500 MG PO TB24: 1000.0000 mg | ORAL_TABLET | Freq: Every day | ORAL | 11 refills | Status: AC

## 2018-12-16 NOTE — Patient Instructions (Signed)
check your blood sugar once a day.  vary the time of day when you check, between before the 3 meals, and at bedtime.  also check if you have symptoms of your blood sugar being too high or too low.  please keep a record of the readings and bring it to your next appointment here (or you can bring the meter itself).  You can write it on any piece of paper.  please call us sooner if your blood sugar goes below 70, or if you have a lot of readings over 200. Please reduce the repaglinide to supper only, and: I have sent a prescription to your pharmacy, to reduce the metformin to 2 pills per day.   Please continue the same other diabetes medications.   Please come back for a follow-up appointment in 2 months.   

## 2018-12-16 NOTE — Progress Notes (Signed)
Subjective:    Patient ID: Jeffrey Kaiser, male    DOB: Mar 11, 1926, 85 y.o.   MRN: [id number]  HPI telehealth visit today via doxy video visit.  Alternatives to telehealth are presented to this patient, and the patient agrees to the telehealth visit. Pt is advised of the cost of the visit, and agrees to this, also.   Patient is at home, and I am at the office.   Persons attending the telehealth visit: the patient, dtr, and I.   Pt returns for f/u of diabetes mellitus:  DM type: 2 Dx'ed: [id number] Complications: retinopathy, renal insufficiency, CAD, and PAD.  Therapy: 3 oral meds.  DKA: never.   Severe hypoglycemia: never.  Pancreatitis: never.   Other: he has never been on insulin; invokana rx is limited by hyperkalemia.   Interval history: no cbg record, but states cbg's are in the 100's.  He takes meds as rx'ed.  No new sxs. Past Medical History:  Diagnosis Date  . Anemia 09/04/2010  . Arthritis   . BPH (benign prostatic hyperplasia) 09/09/2010  . CAD (coronary artery disease)   . DDD (degenerative disc disease), cervical 09/04/2010  . Diabetes mellitus   . Diastolic dysfunction [id number]  . Diverticulitis   . Glaucoma   . Hernia   . History of colonic diverticulitis 09/04/2010  . Hyperlipidemia   . Hypertension   . Psoriasis 09/04/2010  . Stroke [facility])     Past Surgical History:  Procedure Laterality Date  . broken legs  1981 and 1995  . PERIPHERAL VASCULAR CATHETERIZATION N/A 10/25/2014   Procedure: Abdominal Aortogram;  Surgeon: Wellington Hampshire, MD;  Location: [place] [facility];  Service: Cardiovascular;  Laterality: N/A;  . PERIPHERAL VASCULAR CATHETERIZATION Bilateral 10/25/2014   Procedure: Lower Extremity Angiography;  Surgeon: Wellington Hampshire, MD;  Location: [place] [facility];  Service: Cardiovascular;  Laterality: Bilateral;  . s/p lipoma  2003  . TONSILLECTOMY  1952  . TRANSCAROTID ARTERY REVASCULARIZATION Right 11/12/2017   Procedure: TRANSCAROTID ARTERY  REVASCULARIZATION;  Surgeon: Waynetta Sandy, MD;  Location: [place];  Service: Vascular;  Laterality: Right;    Social History   Socioeconomic History  . Marital status: Single    Spouse name: Not on file  . Number of children: Not on file  . Years of education: Not on file  . Highest education level: Not on file  Occupational History  . Occupation: retired  Scientific laboratory technician  . Financial resource strain: Not on file  . Food insecurity    Worry: Not on file    Inability: Not on file  . Transportation needs    Medical: Not on file    Non-medical: Not on file  Tobacco Use  . Smoking status: Never Smoker  . Smokeless tobacco: Never Used  Substance and Sexual Activity  . Alcohol use: No  . Drug use: No  . Sexual activity: Never  Lifestyle  . Physical activity    Days per week: Not on file    Minutes per session: Not on file  . Stress: Not on file  Relationships  . Social Herbalist on phone: Not on file    Gets together: Not on file    Attends religious service: Not on file    Active member of club or organization: Not on file    Attends meetings of clubs or organizations: Not on file    Relationship status: Not on file  . Intimate partner violence  Fear of current or ex partner: Not on file    Emotionally abused: Not on file    Physically abused: Not on file    Forced sexual activity: Not on file  Other Topics Concern  . Not on file  Social History Narrative  . Not on file    Current Outpatient Medications on File Prior to Visit  Medication Sig Dispense Refill  . acetaminophen (TYLENOL) 500 MG tablet Take 500 mg by mouth every 6 (six) hours as needed for mild pain.    Marland Kitchen apixaban (ELIQUIS) 5 MG TABS tablet Take 1 tablet (5 mg total) by mouth 2 (two) times daily. 60 tablet 11  . atorvastatin (LIPITOR) 20 MG tablet TAKE 1 TABLET BY MOUTH EVERY DAY 90 tablet 3  . bromocriptine (PARLODEL) 2.5 MG tablet TAKE 1/2 TABLET BY MOUTH EVERY DAY AT BEDTIME 15  tablet 0  . ciclopirox (LOPROX) 0.77 % cream Apply 1 application topically daily.     . clobetasol (TEMOVATE) 0.05 % external solution Apply topically 2 (two) times daily. (Patient taking differently: Apply 1 application topically 2 (two) times daily. ) 50 mL 1  . clotrimazole-betamethasone (LOTRISONE) cream Apply 1 application topically 3 (three) times daily as needed. For rash 45 g 2  . glucose blood (ONETOUCH VERIO) test strip 1 each by Other route daily. And lancets 1/day 250.00 100 each 12  . isosorbide mononitrate (IMDUR) 60 MG 24 hr tablet TAKE 1/2 TABLET BY MOUTH EVERY DAY 15 tablet 2  . metoprolol succinate (TOPROL-XL) 50 MG 24 hr tablet Take 1 tablet (50 mg total) by mouth 2 (two) times daily. Take with or immediately following a meal. 180 tablet 1  . repaglinide (PRANDIN) 0.5 MG tablet Take 1 tablet (0.5 mg total) by mouth daily with supper. 90 tablet 3  . TRAVATAN Z 0.004 % SOLN ophthalmic solution Place 1 drop into both eyes at bedtime.  4   No current facility-administered medications on file prior to visit.     Allergies  Allergen Reactions  . Levaquin [Levofloxacin In D5w] Nausea Only  . Tramadol Other (See Comments)    somnolence    Family History  Problem Relation Age of Onset  . Arthritis Mother   . Heart disease Mother   . Hypertension Mother   . Arthritis Father   . Heart disease Father   . Hypertension Father   . Cancer Other        lung cancer    Ht 5\' 5"  (1.651 m)   Wt 150 lb (68 kg)   BMI 24.96 kg/m    Review of Systems He denies hypoglycemia.      Objective:   Physical Exam    Lab Results  Component Value Date   CREATININE 0.85 09/02/2018   BUN 17 09/02/2018   NA 141 09/02/2018   K 4.0 09/02/2018   CL 103 09/02/2018   CO2 29 09/02/2018      Assessment & Plan:  Type 2 DM, with PAD: apparently well-controlled    Patient Instructions  check your blood sugar once a day.  vary the time of day when you check, between before the 3  meals, and at bedtime.  also check if you have symptoms of your blood sugar being too high or too low.  please keep a record of the readings and bring it to your next appointment here (or you can bring the meter itself).  You can write it on any piece of paper.  please call us  sooner if your blood sugar goes below 70, or if you have a lot of readings over 200. Please reduce the repaglinide to supper only, and: I have sent a prescription to your pharmacy, to reduce the metformin to 2 pills per day.   Please continue the same other diabetes medications.   Please come back for a follow-up appointment in 2 months.

## 2018-12-17 ENCOUNTER — Other Ambulatory Visit: Payer: Self-pay

## 2018-12-17 ENCOUNTER — Encounter: Payer: Self-pay | Admitting: Family

## 2018-12-17 ENCOUNTER — Ambulatory Visit (HOSPITAL_COMMUNITY)
Admission: RE | Admit: 2018-12-17 | Discharge: 2018-12-17 | Disposition: A | Discharge status: Home / Self Care | Payer: Medicare HMO | Source: Ambulatory Visit | Attending: Family | Admitting: Family

## 2018-12-17 ENCOUNTER — Ambulatory Visit (INDEPENDENT_AMBULATORY_CARE_PROVIDER_SITE_OTHER): Payer: Medicare HMO | Admitting: Family

## 2018-12-17 VITALS — BP 150/66 | HR 64 | Temp 97.8°F | Resp 12 | Ht 65.0 in | Wt 150.9 lb

## 2018-12-17 DIAGNOSIS — I6523 Occlusion and stenosis of bilateral carotid arteries: Secondary | ICD-10-CM | POA: Diagnosis not present

## 2018-12-17 NOTE — Progress Notes (Signed)
Chief Complaint: Follow up Extracranial Carotid Artery Stenosis   History of Present Illness  Jeffrey Kaiser is a 85 y.o. male who is s/p right neck TCAR for symptomatic carotid stenosis performed by Dr. Donzetta Matters on 11/2017.   Preoperatively left ICA also considered to be high-grade stenosis however this has not been replicated since surgery.    Pt was last evaluated on 06-08-18 by M. Eveland PA-C. At that time right ICA stent was widely patent Left ICA only 1 to 39% stenosis by duplex Continue statin Recheck carotid duplex in 6 months per protocol; if unchanged at that time patient can be followed annually.  He denies any strokelike symptoms including slurring speech, changes in vision, or one-sided weakness.  He is on statin daily.  He is also on Eliquis daily for atrial fibrillation prescribed by his cardiologist.  Diabetic: yes, 7.9 A1C in May 2020, almost in control Tobacco use: non-smoker  Pt meds include: Statin : yes ASA: no Other anticoagulants/antiplatelets: Eliquis, has atrial fib   Past Medical History:  Diagnosis Date  . Anemia 09/04/2010  . Arthritis   . BPH (benign prostatic hyperplasia) 09/09/2010  . CAD (coronary artery disease)   . DDD (degenerative disc disease), cervical 09/04/2010  . Diabetes mellitus   . Diastolic dysfunction 99991111  . Diverticulitis   . Glaucoma   . Hernia   . History of colonic diverticulitis 09/04/2010  . Hyperlipidemia   . Hypertension   . Psoriasis 09/04/2010  . Stroke [facility])     Social History Social History   Tobacco Use  . Smoking status: Never Smoker  . Smokeless tobacco: Never Used  Substance Use Topics  . Alcohol use: No  . Drug use: No    Family History Family History  Problem Relation Age of Onset  . Arthritis Mother   . Heart disease Mother   . Hypertension Mother   . Arthritis Father   . Heart disease Father   . Hypertension Father   . Cancer Other        lung cancer    Surgical History Past Surgical  History:  Procedure Laterality Date  . broken legs  1981 and 1995  . PERIPHERAL VASCULAR CATHETERIZATION N/A 10/25/2014   Procedure: Abdominal Aortogram;  Surgeon: Wellington Hampshire, MD;  Location: [place] [facility];  Service: Cardiovascular;  Laterality: N/A;  . PERIPHERAL VASCULAR CATHETERIZATION Bilateral 10/25/2014   Procedure: Lower Extremity Angiography;  Surgeon: Wellington Hampshire, MD;  Location: [place] [facility];  Service: Cardiovascular;  Laterality: Bilateral;  . s/p lipoma  2003  . TONSILLECTOMY  1952  . TRANSCAROTID ARTERY REVASCULARIZATION Right 11/12/2017   Procedure: TRANSCAROTID ARTERY REVASCULARIZATION;  Surgeon: Waynetta Sandy, MD;  Location: [place];  Service: Vascular;  Laterality: Right;    Allergies  Allergen Reactions  . Levaquin [Levofloxacin In D5w] Nausea Only  . Tramadol Other (See Comments)    somnolence    Current Outpatient Medications  Medication Sig Dispense Refill  . acetaminophen (TYLENOL) 500 MG tablet Take 500 mg by mouth every 6 (six) hours as needed for mild pain.    Marland Kitchen apixaban (ELIQUIS) 5 MG TABS tablet Take 1 tablet (5 mg total) by mouth 2 (two) times daily. 60 tablet 11  . atorvastatin (LIPITOR) 20 MG tablet TAKE 1 TABLET BY MOUTH EVERY DAY 90 tablet 3  . bromocriptine (PARLODEL) 2.5 MG tablet TAKE 1/2 TABLET BY MOUTH EVERY DAY AT BEDTIME 15 tablet 0  . ciclopirox (LOPROX) 0.77 % cream  Apply 1 application topically daily.     . clobetasol (TEMOVATE) 0.05 % external solution Apply topically 2 (two) times daily. (Patient taking differently: Apply 1 application topically 2 (two) times daily. ) 50 mL 1  . clotrimazole-betamethasone (LOTRISONE) cream Apply 1 application topically 3 (three) times daily as needed. For rash 45 g 2  . glucose blood (ONETOUCH VERIO) test strip 1 each by Other route daily. And lancets 1/day 250.00 100 each 12  . isosorbide mononitrate (IMDUR) 60 MG 24 hr tablet TAKE 1/2 TABLET BY MOUTH EVERY DAY 15 tablet 2  .  losartan (COZAAR) 25 MG tablet Take 50 mg by mouth daily.    . metFORMIN (GLUCOPHAGE-XR) 500 MG 24 hr tablet Take 2 tablets (1,000 mg total) by mouth daily with breakfast. 60 tablet 11  . metoprolol succinate (TOPROL-XL) 50 MG 24 hr tablet Take 1 tablet (50 mg total) by mouth 2 (two) times daily. Take with or immediately following a meal. 180 tablet 1  . repaglinide (PRANDIN) 0.5 MG tablet Take 1 tablet (0.5 mg total) by mouth daily with supper. 90 tablet 3  . TRAVATAN Z 0.004 % SOLN ophthalmic solution Place 1 drop into both eyes at bedtime.  4   No current facility-administered medications for this visit.     Review of Systems : See HPI for pertinent positives and negatives.  Physical Examination  Vitals:   12/17/18 1421  BP: (!) 150/66  Pulse: 64  Resp: 12  Temp: 97.8 F (36.6 C)  TempSrc: Temporal  SpO2: 100%  Weight: 150 lb 14.4 oz (68.4 kg)  Height: 5\' 5"  (1.651 m)   Body mass index is 25.11 kg/m.  General: WDWN male in NAD GAIT: normal Eyes: PERRLA HENT: No gross abnormalities. Significant cervical kyphosis  Pulmonary:  Respirations are non-labored, good air movement in all fields, CTAB, no rales, rhonchi, or wheezing. Cardiac: irregular rhythm, no detected murmur.  VASCULAR EXAM Carotid Bruits Right Left   Negative Negative     Abdominal aortic pulse is not palpable. Radial pulses are 2+ palpable and equal.                                                                                                                            LE Pulses Right Left       POPLITEAL  not palpable   not palpable       POSTERIOR TIBIAL  faintly palpable   faint;y palpable        DORSALIS PEDIS      ANTERIOR TIBIAL not palpable  not palpable     Gastrointestinal: soft, nontender, BS WNL, no r/g, no palpable masses. Musculoskeletal: no muscle atrophy/wasting. M/S 5/5 throughout, extremities without ischemic changes Skin: No rashes, no ulcers, no cellulitis.   Neurologic:   A&O X 3; appropriate affect, sensation is normal; speech is normal, CN 2-12 intact, pain and light touch intact in extremities, motor exam as listed above. Psychiatric: Normal thought content, mood  appropriate to clinical situation.    DATA Carotid Duplex (12-17-18); Right Carotid: Velocities in the right ICA are consistent with a 1-39% stenosis.                The ECA appears >50% stenosed. Left Carotid: Velocities in the left ICA are consistent with a 1-39% stenosis.               Non-hemodynamically significant plaque <50% noted in the CCA. Vertebrals:  Bilateral vertebral arteries demonstrate antegrade flow. Subclavians: Normal flow hemodynamics were seen in bilateral subclavian arteries. Slight increased stenosis in the right, and stable minimal stenosis in the left ICA compared to the exam in March 2020.    Assessment: Jeffrey Kaiser is a 85 y.o. male who is s/p right neck TCAR for symptomatic carotid stenosis performed by Dr. Donzetta Matters on 11/2017.    He has not had any subsequent stroke or TIA. He takes Eliquis for atrial fib, takes a statin. He has never used tobacco, and his DM is in fairly good control.  He stays active.  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  Carotid duplex today shows 1-39% bilateral ICA stenosis.    Plan: Follow-up in 1 year with Carotid Duplex scan.   I discussed in depth with the patient the nature of atherosclerosis, and emphasized the importance of maximal medical management including strict control of blood pressure, blood glucose, and lipid levels, obtaining regular exercise, and continued cessation of smoking.  The patient is aware that without maximal medical management the underlying atherosclerotic disease process will progress, limiting the benefit of any interventions. The patient was given information about stroke prevention and what symptoms should prompt the patient to seek immediate medical care. Thank you for allowing [place] to  participate in this patient's care.  Clemon Chambers, RN, MSN, FNP-C [facility] Office: [phone]  Clinic Physician: Donzetta Matters on call  12/17/18 2:32 PM

## 2018-12-17 NOTE — Patient Instructions (Signed)

## 2019-01-20 ENCOUNTER — Other Ambulatory Visit: Payer: Self-pay | Admitting: Endocrinology

## 2019-01-20 DIAGNOSIS — H01009 Unspecified blepharitis unspecified eye, unspecified eyelid: Secondary | ICD-10-CM | POA: Diagnosis not present

## 2019-01-20 DIAGNOSIS — H11001 Unspecified pterygium of right eye: Secondary | ICD-10-CM | POA: Diagnosis not present

## 2019-01-20 DIAGNOSIS — H401132 Primary open-angle glaucoma, bilateral, moderate stage: Secondary | ICD-10-CM | POA: Diagnosis not present

## 2019-01-24 ENCOUNTER — Ambulatory Visit (INDEPENDENT_AMBULATORY_CARE_PROVIDER_SITE_OTHER): Payer: Medicare HMO

## 2019-01-24 ENCOUNTER — Other Ambulatory Visit: Payer: Self-pay

## 2019-01-24 DIAGNOSIS — Z23 Encounter for immunization: Secondary | ICD-10-CM | POA: Diagnosis not present

## 2019-01-25 ENCOUNTER — Other Ambulatory Visit: Payer: Self-pay | Admitting: Endocrinology

## 2019-01-27 ENCOUNTER — Telehealth: Payer: Self-pay

## 2019-01-27 MED ORDER — ISOSORBIDE MONONITRATE ER 60 MG PO TB24: 30.0000 mg | ORAL_TABLET | Freq: Every day | ORAL | 1 refills | Status: DC

## 2019-02-11 ENCOUNTER — Other Ambulatory Visit: Payer: Self-pay

## 2019-02-11 ENCOUNTER — Telehealth: Payer: Self-pay

## 2019-02-11 MED ORDER — BROMOCRIPTINE MESYLATE 2.5 MG PO TABS: ORAL_TABLET | ORAL | 0 refills | Status: DC

## 2019-02-11 NOTE — Telephone Encounter (Signed)
Joseph Art has been informed that form has been filled out and will be mailed out to the home.

## 2019-02-11 NOTE — Telephone Encounter (Signed)
Patients daughter called in wanting to get a refill on bromocriptine (PARLODEL) 2.5 MG tablet...  Patient daughter is saying [facility] never has the medication and she doesn't know what else to do and needs help getting thing refilled for him..  Please call and advise

## 2019-02-11 NOTE — Telephone Encounter (Signed)
OK, please refill x 1, pending that appt.

## 2019-02-11 NOTE — Telephone Encounter (Signed)
RX sent, further refills at time of upcoming appointment.

## 2019-02-11 NOTE — Telephone Encounter (Signed)
Done hardcopy to shirron 

## 2019-02-11 NOTE — Telephone Encounter (Signed)
Last refills had a note that patient needs appointment for refills. I do see he now has a doxy visit soon. Please advise on refilling until appointment and is this a once daily?

## 2019-02-11 NOTE — Telephone Encounter (Signed)
Copied from [place] [id number]. Topic: General - Other >> Feb 11, 2019  3:43 PM Erick Blinks wrote: Reason for CRM: Pt's daughter is requesting a handicap placard for pt, plans to send MyChart message Joseph Art:: [phone]

## 2019-02-18 ENCOUNTER — Ambulatory Visit: Payer: Medicare HMO | Admitting: Endocrinology

## 2019-02-23 ENCOUNTER — Telehealth: Payer: Self-pay

## 2019-02-23 ENCOUNTER — Ambulatory Visit (INDEPENDENT_AMBULATORY_CARE_PROVIDER_SITE_OTHER): Payer: Medicare HMO | Admitting: Endocrinology

## 2019-02-23 ENCOUNTER — Other Ambulatory Visit: Payer: Self-pay

## 2019-02-23 ENCOUNTER — Encounter: Payer: Self-pay | Admitting: Endocrinology

## 2019-02-23 DIAGNOSIS — E1159 Type 2 diabetes mellitus with other circulatory complications: Secondary | ICD-10-CM | POA: Diagnosis not present

## 2019-02-23 NOTE — Telephone Encounter (Signed)
Per Dr. Loanne Drilling, pt will need to be scheduled for an in office nurse visit, POC A1C. Also states pt does not require any other labs other than the POC A1C. Routing this message to the front desk for scheduling purposes.

## 2019-02-23 NOTE — Patient Instructions (Addendum)
check your blood sugar once a day.  vary the time of day when you check, between before the 3 meals, and at bedtime.  also check if you have symptoms of your blood sugar being too high or too low.  please keep a record of the readings and bring it to your next appointment here (or you can bring the meter itself).  You can write it on any piece of paper.  please call us sooner if your blood sugar goes below 70, or if you have a lot of readings over 200.   Please come in to have the A1c checked.  Please come back for a follow-up appointment in 4 months.     

## 2019-02-23 NOTE — Progress Notes (Signed)
Subjective:    Patient ID: Jeffrey Kaiser, male    DOB: 15-Oct-1925, 85 y.o.   MRN: [id number]  HPI telehealth visit today via doxy video visit.  Alternatives to telehealth are presented to this patient, and the patient agrees to the telehealth visit. Pt is advised of the cost of the visit, and agrees to this, also.   Patient is at home, and I am at the office.   Persons attending the telehealth visit: the patient, dtr, and I.   Pt returns for f/u of diabetes mellitus:  DM type: 2 Dx'ed: [id number] Complications: retinopathy, renal insufficiency, CAD, and PAD.  Therapy: 3 oral meds.  DKA: never.   Severe hypoglycemia: never.  Pancreatitis: never.   Other: he has never been on insulin; invokana rx is limited by hyperkalemia.   Interval history: no cbg record, but states cbg's are well-controlled.  He takes meds as rx'ed.  No new sxs.  Past Medical History:  Diagnosis Date  . Anemia 09/04/2010  . Arthritis   . BPH (benign prostatic hyperplasia) 09/09/2010  . CAD (coronary artery disease)   . DDD (degenerative disc disease), cervical 09/04/2010  . Diabetes mellitus   . Diastolic dysfunction 99991111  . Diverticulitis   . Glaucoma   . Hernia   . History of colonic diverticulitis 09/04/2010  . Hyperlipidemia   . Hypertension   . Psoriasis 09/04/2010  . Stroke [facility])     Past Surgical History:  Procedure Laterality Date  . broken legs  1981 and 1995  . PERIPHERAL VASCULAR CATHETERIZATION N/A 10/25/2014   Procedure: Abdominal Aortogram;  Surgeon: Wellington Hampshire, MD;  Location: [place] [facility];  Service: Cardiovascular;  Laterality: N/A;  . PERIPHERAL VASCULAR CATHETERIZATION Bilateral 10/25/2014   Procedure: Lower Extremity Angiography;  Surgeon: Wellington Hampshire, MD;  Location: [place] [facility];  Service: Cardiovascular;  Laterality: Bilateral;  . s/p lipoma  2003  . TONSILLECTOMY  1952  . TRANSCAROTID ARTERY REVASCULARIZATION Right 11/12/2017   Procedure: TRANSCAROTID ARTERY  REVASCULARIZATION;  Surgeon: Waynetta Sandy, MD;  Location: [place];  Service: Vascular;  Laterality: Right;    Social History   Socioeconomic History  . Marital status: Single    Spouse name: Not on file  . Number of children: Not on file  . Years of education: Not on file  . Highest education level: Not on file  Occupational History  . Occupation: retired  Scientific laboratory technician  . Financial resource strain: Not on file  . Food insecurity    Worry: Not on file    Inability: Not on file  . Transportation needs    Medical: Not on file    Non-medical: Not on file  Tobacco Use  . Smoking status: Never Smoker  . Smokeless tobacco: Never Used  Substance and Sexual Activity  . Alcohol use: No  . Drug use: No  . Sexual activity: Never  Lifestyle  . Physical activity    Days per week: Not on file    Minutes per session: Not on file  . Stress: Not on file  Relationships  . Social Herbalist on phone: Not on file    Gets together: Not on file    Attends religious service: Not on file    Active member of club or organization: Not on file    Attends meetings of clubs or organizations: Not on file    Relationship status: Not on file  . Intimate partner violence  Fear of current or ex partner: Not on file    Emotionally abused: Not on file    Physically abused: Not on file    Forced sexual activity: Not on file  Other Topics Concern  . Not on file  Social History Narrative  . Not on file    Current Outpatient Medications on File Prior to Visit  Medication Sig Dispense Refill  . acetaminophen (TYLENOL) 500 MG tablet Take 500 mg by mouth every 6 (six) hours as needed for mild pain.    Marland Kitchen apixaban (ELIQUIS) 5 MG TABS tablet Take 1 tablet (5 mg total) by mouth 2 (two) times daily. 60 tablet 11  . atorvastatin (LIPITOR) 20 MG tablet TAKE 1 TABLET BY MOUTH EVERY DAY 90 tablet 3  . bromocriptine (PARLODEL) 2.5 MG tablet Take one tablet daily. 15 tablet 0  . ciclopirox  (LOPROX) 0.77 % cream Apply 1 application topically daily.     . clobetasol (TEMOVATE) 0.05 % external solution Apply topically 2 (two) times daily. (Patient taking differently: Apply 1 application topically 2 (two) times daily. ) 50 mL 1  . clotrimazole-betamethasone (LOTRISONE) cream Apply 1 application topically 3 (three) times daily as needed. For rash 45 g 2  . glucose blood (ONETOUCH VERIO) test strip 1 each by Other route daily. And lancets 1/day 250.00 100 each 12  . isosorbide mononitrate (IMDUR) 60 MG 24 hr tablet Take 0.5 tablets (30 mg total) by mouth daily. 45 tablet 1  . losartan (COZAAR) 25 MG tablet Take 50 mg by mouth daily.    . metFORMIN (GLUCOPHAGE-XR) 500 MG 24 hr tablet Take 2 tablets (1,000 mg total) by mouth daily with breakfast. 60 tablet 11  . metoprolol succinate (TOPROL-XL) 50 MG 24 hr tablet Take 1 tablet (50 mg total) by mouth 2 (two) times daily. Take with or immediately following a meal. 180 tablet 1  . repaglinide (PRANDIN) 0.5 MG tablet Take 1 tablet (0.5 mg total) by mouth daily with supper. 90 tablet 3  . TRAVATAN Z 0.004 % SOLN ophthalmic solution Place 1 drop into both eyes at bedtime.  4   No current facility-administered medications on file prior to visit.     Allergies  Allergen Reactions  . Levaquin [Levofloxacin In D5w] Nausea Only  . Tramadol Other (See Comments)    somnolence    Family History  Problem Relation Age of Onset  . Arthritis Mother   . Heart disease Mother   . Hypertension Mother   . Arthritis Father   . Heart disease Father   . Hypertension Father   . Cancer Other        lung cancer    There were no vitals taken for this visit.   Review of Systems He denies hypoglycemia and weight change.      Objective:   Physical Exam  Lab Results  Component Value Date   CREATININE 0.85 09/02/2018   BUN 17 09/02/2018   NA 141 09/02/2018   K 4.0 09/02/2018   CL 103 09/02/2018   CO2 29 09/02/2018       Assessment & Plan:   Type 2 DM, with PAD: apparently well-controlled Frail elderly state: he is not a candidate for aggressive glycemic control.  Patient Instructions  check your blood sugar once a day.  vary the time of day when you check, between before the 3 meals, and at bedtime.  also check if you have symptoms of your blood sugar being too high or too low.  please keep a record of the readings and bring it to your next appointment here (or you can bring the meter itself).  You can write it on any piece of paper.  please call us sooner if your blood sugar goes below 70, or if you have a lot of readings over 200. Please come in to have the A1c checked. Please come back for a follow-up appointment in 4 months.

## 2019-02-27 ENCOUNTER — Other Ambulatory Visit: Payer: Self-pay | Admitting: Endocrinology

## 2019-02-28 NOTE — Telephone Encounter (Signed)
This has been addressed already

## 2019-03-14 ENCOUNTER — Other Ambulatory Visit: Payer: Self-pay | Admitting: Endocrinology

## 2019-03-27 ENCOUNTER — Other Ambulatory Visit: Payer: Self-pay | Admitting: Endocrinology

## 2019-04-11 ENCOUNTER — Telehealth: Payer: Self-pay

## 2019-04-11 NOTE — Telephone Encounter (Signed)
Company: [employer] Document: [employer]  Other records requested: None requested  All above requested information has been faxed successfully to [employer] listed above. Documents and fax confirmation have been placed in the faxed file for future reference.

## 2019-04-12 ENCOUNTER — Ambulatory Visit (INDEPENDENT_AMBULATORY_CARE_PROVIDER_SITE_OTHER): Payer: Medicare HMO | Admitting: Cardiovascular Disease

## 2019-04-12 ENCOUNTER — Other Ambulatory Visit: Payer: Self-pay

## 2019-04-12 VITALS — BP 199/77 | HR 70 | Temp 97.5°F | Ht 65.0 in | Wt 154.4 lb

## 2019-04-12 DIAGNOSIS — I739 Peripheral vascular disease, unspecified: Secondary | ICD-10-CM

## 2019-04-12 DIAGNOSIS — I10 Essential (primary) hypertension: Secondary | ICD-10-CM | POA: Diagnosis not present

## 2019-04-12 DIAGNOSIS — I2510 Atherosclerotic heart disease of native coronary artery without angina pectoris: Secondary | ICD-10-CM

## 2019-04-12 DIAGNOSIS — I480 Paroxysmal atrial fibrillation: Secondary | ICD-10-CM | POA: Diagnosis not present

## 2019-04-12 MED ORDER — LOSARTAN POTASSIUM 100 MG PO TABS: 100.0000 mg | ORAL_TABLET | Freq: Every day | ORAL | 3 refills | Status: AC

## 2019-04-12 NOTE — Progress Notes (Signed)
Cardiology Office Note   Date:  04/14/2019   ID:  Amichai, Elks 1925-05-31, MRN [id number]  PCP:  [name], MD  Cardiologist:   [name], MD   No chief complaint on file.     History of Present Illness: Jeffrey Kaiser is a 86 y.o. male who presents for with history of peripheral arterial disease and paroxysmal atrial fibrillation.  He has reported history of coronary artery disease with no available details, prolonged history of diabetes, hyperlipidemia, hypertension and arthritis. He has no history of tobacco use.  He was seen in 2016 for severe left calf claudication. Noninvasive vascular evaluation showed an ABI of 0.93 on the right and 0.31 on the left.  Angiography in 7/16  showed: 1. No significant aortoiliac disease. 2. Moderate distal left common femoral artery stenosis. Mild nonobstructive disease in the left SFA. Subtotal occlusion of the left popliteal artery proximally with 1 vessel runoff below the knee. 3. Mild nonobstructive disease affecting the right SFA and popliteal arteries. 2 vessel runoff below the knee on the right. The patient was treated medically.  The patient had a TIA lin 2019  and was diagnosed with severe bilateral carotid disease.  He underwent TCAR on right carotid artery by Dr. Donzetta Matters.    He has been doing well with no recent chest pain, shortness of breath or palpitations.  He continues to take care anticoagulation with Eliquis with no bleeding issues.  The medication is expensive and he has to pay about $130 a month.  However, he does not want to go on warfarin.  His blood pressure has been running high but his home readings are actually better.     Past Medical History:  Diagnosis Date  . Anemia 09/04/2010  . Arthritis   . BPH (benign prostatic hyperplasia) 09/09/2010  . CAD (coronary artery disease)   . DDD (degenerative disc disease), cervical 09/04/2010  . Diabetes mellitus   . Diastolic dysfunction 99991111  .  Diverticulitis   . Glaucoma   . Hernia   . History of colonic diverticulitis 09/04/2010  . Hyperlipidemia   . Hypertension   . Psoriasis 09/04/2010  . Stroke [facility])     Past Surgical History:  Procedure Laterality Date  . broken legs  1981 and 1995  . PERIPHERAL VASCULAR CATHETERIZATION N/A 10/25/2014   Procedure: Abdominal Aortogram;  Surgeon: Wellington Hampshire, MD;  Location: [facility];  Service: Cardiovascular;  Laterality: N/A;  . PERIPHERAL VASCULAR CATHETERIZATION Bilateral 10/25/2014   Procedure: Lower Extremity Angiography;  Surgeon: Wellington Hampshire, MD;  Location: [facility];  Service: Cardiovascular;  Laterality: Bilateral;  . s/p lipoma  2003  . TONSILLECTOMY  1952  . TRANSCAROTID ARTERY REVASCULARIZATION Right 11/12/2017   Procedure: TRANSCAROTID ARTERY REVASCULARIZATION;  Surgeon: Waynetta Sandy, MD;  Location: [facility];  Service: Vascular;  Laterality: Right;     Current Outpatient Medications  Medication Sig Dispense Refill  . acetaminophen (TYLENOL) 500 MG tablet Take 500 mg by mouth every 6 (six) hours as needed for mild pain.    Marland Kitchen apixaban (ELIQUIS) 5 MG TABS tablet Take 1 tablet (5 mg total) by mouth 2 (two) times daily. 60 tablet 11  . atorvastatin (LIPITOR) 20 MG tablet TAKE 1 TABLET BY MOUTH EVERY DAY 90 tablet 3  . bacitracin ophthalmic ointment     . bromocriptine (PARLODEL) 2.5 MG tablet TAKE 1 TABLET BY MOUTH EVERY DAY 90 tablet 1  . ciclopirox (LOPROX)  0.77 % cream Apply 1 application topically daily.     . clobetasol (TEMOVATE) 0.05 % external solution Apply topically 2 (two) times daily. (Patient taking differently: Apply 1 application topically 2 (two) times daily. ) 50 mL 1  . clotrimazole-betamethasone (LOTRISONE) cream Apply 1 application topically 3 (three) times daily as needed. For rash 45 g 2  . doxycycline (VIBRAMYCIN) 50 MG capsule     . glucose blood (ONETOUCH VERIO) test strip 1 each by Other route daily. And lancets  1/day 250.00 100 each 12  . isosorbide mononitrate (IMDUR) 60 MG 24 hr tablet Take 0.5 tablets (30 mg total) by mouth daily. 45 tablet 1  . losartan (COZAAR) 100 MG tablet Take 1 tablet (100 mg total) by mouth daily. 90 tablet 3  . metFORMIN (GLUCOPHAGE-XR) 500 MG 24 hr tablet Take 2 tablets (1,000 mg total) by mouth daily with breakfast. 60 tablet 11  . metoprolol succinate (TOPROL-XL) 50 MG 24 hr tablet Take 1 tablet (50 mg total) by mouth 2 (two) times daily. Take with or immediately following a meal. 180 tablet 1  . repaglinide (PRANDIN) 0.5 MG tablet Take 1 tablet (0.5 mg total) by mouth daily with supper. 90 tablet 3  . tobramycin-dexamethasone (TOBRADEX) ophthalmic solution     . TRAVATAN Z 0.004 % SOLN ophthalmic solution Place 1 drop into both eyes at bedtime.  4   No current facility-administered medications for this visit.    Allergies:   Levaquin [levofloxacin in d5w] and Tramadol    Social History:  The patient  reports that he has never smoked. He has never used smokeless tobacco. He reports that he does not drink alcohol or use drugs.   Family History:  The patient's family history includes Arthritis in his father and mother; Cancer in an other family member; Heart disease in his father and mother; Hypertension in his father and mother.    ROS:  Please see the history of present illness.   Otherwise, review of systems are positive for none.   All other systems are reviewed and negative.    PHYSICAL EXAM: VS:  BP (!) 199/77   Pulse 70   Temp (!) 97.5 F (36.4 C)   Ht 5\' 5"  (1.651 m)   Wt 154 lb 6.4 oz (70 kg)   SpO2 98%   BMI 25.69 kg/m  , BMI Body mass index is 25.69 kg/m. GEN: Well nourished, well developed, in no acute distress  HEENT: normal  Neck: no JVD,  or masses Cardiac: Irregularly irregular and tachycardic; no murmurs, rubs, or gallops,no edema  Respiratory:  clear to auscultation bilaterally, normal work of breathing GI: soft, nontender, nondistended,  + BS MS: no deformity or atrophy  Skin: warm and dry, no rash Neuro:  Strength and sensation are intact Psych: euthymic mood, full affect Distal pulses are not palpable.   EKG:  EKG is  ordered today. EKG showed normal sinus rhythm with inferior lateral T wave changes suggestive of ischemia.  Recent Labs: 09/02/2018: ALT 11; BUN 17; Creatinine, Ser 0.85; Hemoglobin 12.2; Platelets 152.0; Potassium 4.0; Sodium 141; TSH 2.72    Lipid Panel    Component Value Date/Time   CHOL 148 09/02/2018 1410   TRIG 140.0 09/02/2018 1410   HDL 43.40 09/02/2018 1410   CHOLHDL 3 09/02/2018 1410   VLDL 28.0 09/02/2018 1410   LDLCALC 77 09/02/2018 1410   LDLDIRECT 114.4 05/13/2012 1417      Wt Readings from Last 3 Encounters:  04/12/19 154  lb 6.4 oz (70 kg)  12/17/18 150 lb 14.4 oz (68.4 kg)  12/16/18 150 lb (68 kg)       ASSESSMENT AND PLAN:  1.    Paroxysmal atrial fibrillation: He is maintaining in sinus rhythm with no recurrent symptoms.  He is tolerating anticoagulation with Eliquis with no side effects.  Chads Vas score is 8.   2.  Peripheral arterial disease with minimal claudication: Continue medical therapy  3. Hyperlipidemia: Continue treatment with atorvastatin with a target LDL of less than 70.  Most recent lipid profile showed an LDL of 77.  4. Coronary artery disease involving native coronary arteries without angina: Continue medical therapy.   5. Essential hypertension:  Blood pressure has been elevated.  I increased losartan to 100 mg once daily.  6.  Bilateral carotid disease status post revascularization on the right side.  Followed by [facility].   Disposition:   FU with me in 6 months.  Signed,  [name], MD  04/14/2019 12:37 PM    [facility]

## 2019-04-12 NOTE — Patient Instructions (Signed)
Medication Instructions:  INCREASE YOUR LOSARTAN TO 100 MG DAILY  *If you need a refill on your cardiac medications before your next appointment, please call your pharmacy*  Lab Work: NONE   Testing/Procedures: NONE  Follow-Up: At [facility], you and your health needs are our priority.  As part of our continuing mission to provide you with exceptional heart care, we have created designated Provider Care Teams.  These Care Teams include your primary Cardiologist (physician) and Advanced Practice Providers (APPs -  Physician Assistants and Nurse Practitioners) who all work together to provide you with the care you need, when you need it.  Your next appointment:   6 month(s)  You will receive a reminder letter in the mail two months in advance. If you don't receive a letter, please call our office to schedule the follow-up appointment.  The format for your next appointment:   In Person  Provider:   DR Fletcher Anon

## 2019-04-20 DIAGNOSIS — I10 Essential (primary) hypertension: Secondary | ICD-10-CM | POA: Diagnosis not present

## 2019-04-20 DIAGNOSIS — B351 Tinea unguium: Secondary | ICD-10-CM | POA: Diagnosis not present

## 2019-04-20 DIAGNOSIS — Z8639 Personal history of other endocrine, nutritional and metabolic disease: Secondary | ICD-10-CM | POA: Diagnosis not present

## 2019-04-20 DIAGNOSIS — Z6828 Body mass index (BMI) 28.0-28.9, adult: Secondary | ICD-10-CM | POA: Diagnosis not present

## 2019-04-20 NOTE — Telephone Encounter (Signed)
He should just go back to 5 mg twice daily as was addressed by Cyril Mourning.

## 2019-05-08 ENCOUNTER — Ambulatory Visit: Payer: Medicare HMO

## 2019-05-14 ENCOUNTER — Ambulatory Visit: Payer: Medicare HMO

## 2019-05-26 ENCOUNTER — Encounter: Payer: Self-pay | Admitting: Internal Medicine

## 2019-05-29 ENCOUNTER — Ambulatory Visit: Payer: Medicare HMO

## 2019-06-09 DIAGNOSIS — E119 Type 2 diabetes mellitus without complications: Secondary | ICD-10-CM | POA: Diagnosis not present

## 2019-06-22 ENCOUNTER — Other Ambulatory Visit: Payer: Self-pay | Admitting: Cardiovascular Disease

## 2019-06-23 NOTE — Telephone Encounter (Signed)
Please review for refill. Thanks!  

## 2019-07-21 ENCOUNTER — Other Ambulatory Visit: Payer: Self-pay | Admitting: Internal Medicine

## 2019-08-24 ENCOUNTER — Telehealth: Payer: Self-pay | Admitting: Emergency Medicine

## 2019-08-24 NOTE — Telephone Encounter (Signed)
Death certificate came in mail from [facility]. Placed in MDs box to be signed then mailed back to funeral home.

## 2019-08-25 NOTE — Telephone Encounter (Signed)
Death certificate signed, emailed HIM to mark chart deceased and mailed to funeral home in enclosed envelope.

## 2019-09-03 ENCOUNTER — Other Ambulatory Visit: Payer: Self-pay | Admitting: Cardiovascular Disease

## 2019-09-04 ENCOUNTER — Other Ambulatory Visit: Payer: Self-pay | Admitting: Internal Medicine

## 2019-09-04 DIAGNOSIS — E1151 Type 2 diabetes mellitus with diabetic peripheral angiopathy without gangrene: Secondary | ICD-10-CM

## 2019-09-06 DEATH — deceased

## 2019-09-07 ENCOUNTER — Other Ambulatory Visit: Payer: Self-pay | Admitting: Internal Medicine
